# Patient Record
Sex: Female | Born: 1970 | Race: White | Hispanic: No | Marital: Married | Smoking: Never smoker
Health system: Southern US, Community
[De-identification: ages and names within clinical notes are randomized; demographics above are authoritative.]

## PROBLEM LIST (undated history)

## (undated) DIAGNOSIS — IMO0002 Reserved for concepts with insufficient information to code with codable children: Secondary | ICD-10-CM

## (undated) DIAGNOSIS — B977 Papillomavirus as the cause of diseases classified elsewhere: Secondary | ICD-10-CM

## (undated) DIAGNOSIS — F419 Anxiety disorder, unspecified: Secondary | ICD-10-CM

## (undated) DIAGNOSIS — B009 Herpesviral infection, unspecified: Secondary | ICD-10-CM

## (undated) DIAGNOSIS — I10 Essential (primary) hypertension: Secondary | ICD-10-CM

## (undated) HISTORY — DX: Herpesviral infection, unspecified: B00.9

## (undated) HISTORY — PX: TENDON RECONSTRUCTION: SHX2487

## (undated) HISTORY — PX: OTHER SURGICAL HISTORY: SHX169

## (undated) HISTORY — DX: Papillomavirus as the cause of diseases classified elsewhere: B97.7

## (undated) HISTORY — PX: WISDOM TOOTH EXTRACTION: SHX21

## (undated) HISTORY — DX: Reserved for concepts with insufficient information to code with codable children: IMO0002

## (undated) HISTORY — DX: Anxiety disorder, unspecified: F41.9

---

## 1990-01-20 HISTORY — PX: GYNECOLOGIC CRYOSURGERY: SHX857

## 1997-06-16 ENCOUNTER — Emergency Department (HOSPITAL_COMMUNITY): Admission: EM | Admit: 1997-06-16 | Discharge: 1997-06-16 | Payer: Self-pay | Admitting: Emergency Medicine

## 1997-07-26 ENCOUNTER — Other Ambulatory Visit: Admission: RE | Admit: 1997-07-26 | Discharge: 1997-07-26 | Payer: Self-pay | Admitting: Gynecology

## 1998-07-23 ENCOUNTER — Other Ambulatory Visit: Admission: RE | Admit: 1998-07-23 | Discharge: 1998-07-23 | Payer: Self-pay | Admitting: Gynecology

## 1999-09-06 ENCOUNTER — Other Ambulatory Visit: Admission: RE | Admit: 1999-09-06 | Discharge: 1999-09-06 | Payer: Self-pay | Admitting: Gynecology

## 2000-09-11 ENCOUNTER — Other Ambulatory Visit: Admission: RE | Admit: 2000-09-11 | Discharge: 2000-09-11 | Payer: Self-pay | Admitting: Gynecology

## 2001-07-21 ENCOUNTER — Encounter: Payer: Self-pay | Admitting: Gynecology

## 2001-07-21 ENCOUNTER — Encounter: Admission: RE | Admit: 2001-07-21 | Discharge: 2001-07-21 | Payer: Self-pay | Admitting: Gynecology

## 2001-09-17 ENCOUNTER — Other Ambulatory Visit: Admission: RE | Admit: 2001-09-17 | Discharge: 2001-09-17 | Payer: Self-pay | Admitting: Gynecology

## 2002-10-14 ENCOUNTER — Other Ambulatory Visit: Admission: RE | Admit: 2002-10-14 | Discharge: 2002-10-14 | Payer: Self-pay | Admitting: Gynecology

## 2003-10-20 ENCOUNTER — Other Ambulatory Visit: Admission: RE | Admit: 2003-10-20 | Discharge: 2003-10-20 | Payer: Self-pay | Admitting: Gynecology

## 2004-10-25 ENCOUNTER — Other Ambulatory Visit: Admission: RE | Admit: 2004-10-25 | Discharge: 2004-10-25 | Payer: Self-pay | Admitting: Gynecology

## 2005-10-31 ENCOUNTER — Other Ambulatory Visit: Admission: RE | Admit: 2005-10-31 | Discharge: 2005-10-31 | Payer: Self-pay | Admitting: Gynecology

## 2006-11-21 DIAGNOSIS — B977 Papillomavirus as the cause of diseases classified elsewhere: Secondary | ICD-10-CM

## 2006-11-21 HISTORY — DX: Papillomavirus as the cause of diseases classified elsewhere: B97.7

## 2006-12-11 ENCOUNTER — Other Ambulatory Visit: Admission: RE | Admit: 2006-12-11 | Discharge: 2006-12-11 | Payer: Self-pay | Admitting: Gynecology

## 2007-06-21 DIAGNOSIS — IMO0002 Reserved for concepts with insufficient information to code with codable children: Secondary | ICD-10-CM

## 2007-06-21 HISTORY — DX: Reserved for concepts with insufficient information to code with codable children: IMO0002

## 2007-07-01 ENCOUNTER — Other Ambulatory Visit: Admission: RE | Admit: 2007-07-01 | Discharge: 2007-07-01 | Payer: Self-pay | Admitting: Gynecology

## 2007-12-13 ENCOUNTER — Other Ambulatory Visit: Admission: RE | Admit: 2007-12-13 | Discharge: 2007-12-13 | Payer: Self-pay | Admitting: Gynecology

## 2007-12-13 ENCOUNTER — Ambulatory Visit: Payer: Self-pay | Admitting: Women's Health

## 2007-12-13 ENCOUNTER — Encounter: Payer: Self-pay | Admitting: Women's Health

## 2008-02-10 ENCOUNTER — Ambulatory Visit: Payer: Self-pay | Admitting: Gynecology

## 2008-02-10 ENCOUNTER — Other Ambulatory Visit: Admission: RE | Admit: 2008-02-10 | Discharge: 2008-02-10 | Payer: Self-pay | Admitting: Gynecology

## 2008-02-10 ENCOUNTER — Encounter: Payer: Self-pay | Admitting: Gynecology

## 2008-02-17 ENCOUNTER — Ambulatory Visit: Payer: Self-pay | Admitting: Gynecology

## 2008-02-24 ENCOUNTER — Encounter: Admission: RE | Admit: 2008-02-24 | Discharge: 2008-02-24 | Payer: Self-pay | Admitting: Gynecology

## 2008-03-23 ENCOUNTER — Ambulatory Visit: Payer: Self-pay | Admitting: Gynecology

## 2008-04-24 ENCOUNTER — Ambulatory Visit: Payer: Self-pay | Admitting: Gynecology

## 2008-06-15 ENCOUNTER — Ambulatory Visit: Payer: Self-pay | Admitting: Gynecology

## 2008-12-21 ENCOUNTER — Other Ambulatory Visit: Admission: RE | Admit: 2008-12-21 | Discharge: 2008-12-21 | Payer: Self-pay | Admitting: Gynecology

## 2008-12-21 ENCOUNTER — Ambulatory Visit: Payer: Self-pay | Admitting: Women's Health

## 2009-12-27 ENCOUNTER — Other Ambulatory Visit
Admission: RE | Admit: 2009-12-27 | Discharge: 2009-12-27 | Payer: Self-pay | Source: Home / Self Care | Admitting: Gynecology

## 2009-12-27 ENCOUNTER — Ambulatory Visit: Payer: Self-pay | Admitting: Women's Health

## 2010-02-14 ENCOUNTER — Other Ambulatory Visit: Payer: Self-pay | Admitting: Gynecology

## 2010-02-14 DIAGNOSIS — Z1239 Encounter for other screening for malignant neoplasm of breast: Secondary | ICD-10-CM

## 2010-02-14 DIAGNOSIS — Z1231 Encounter for screening mammogram for malignant neoplasm of breast: Secondary | ICD-10-CM

## 2010-02-28 ENCOUNTER — Ambulatory Visit
Admission: RE | Admit: 2010-02-28 | Discharge: 2010-02-28 | Disposition: A | Discharge status: Home / Self Care | Payer: BC Managed Care – PPO | Source: Ambulatory Visit | Attending: Gynecology | Admitting: Gynecology

## 2010-02-28 DIAGNOSIS — Z1231 Encounter for screening mammogram for malignant neoplasm of breast: Secondary | ICD-10-CM

## 2010-03-06 ENCOUNTER — Other Ambulatory Visit: Payer: Self-pay | Admitting: Gynecology

## 2010-03-06 DIAGNOSIS — R928 Other abnormal and inconclusive findings on diagnostic imaging of breast: Secondary | ICD-10-CM

## 2010-03-14 ENCOUNTER — Ambulatory Visit
Admission: RE | Admit: 2010-03-14 | Discharge: 2010-03-14 | Disposition: A | Discharge status: Home / Self Care | Payer: BC Managed Care – PPO | Source: Ambulatory Visit | Attending: Gynecology | Admitting: Gynecology

## 2010-03-14 DIAGNOSIS — R928 Other abnormal and inconclusive findings on diagnostic imaging of breast: Secondary | ICD-10-CM

## 2010-12-27 DIAGNOSIS — B009 Herpesviral infection, unspecified: Secondary | ICD-10-CM | POA: Insufficient documentation

## 2010-12-27 DIAGNOSIS — R87619 Unspecified abnormal cytological findings in specimens from cervix uteri: Secondary | ICD-10-CM | POA: Insufficient documentation

## 2010-12-27 DIAGNOSIS — N200 Calculus of kidney: Secondary | ICD-10-CM | POA: Insufficient documentation

## 2010-12-27 DIAGNOSIS — IMO0002 Reserved for concepts with insufficient information to code with codable children: Secondary | ICD-10-CM | POA: Insufficient documentation

## 2011-01-03 ENCOUNTER — Encounter: Payer: Self-pay | Admitting: Women's Health

## 2011-01-03 ENCOUNTER — Other Ambulatory Visit (HOSPITAL_COMMUNITY)
Admission: RE | Admit: 2011-01-03 | Discharge: 2011-01-03 | Disposition: A | Discharge status: Home / Self Care | Payer: BC Managed Care – PPO | Source: Ambulatory Visit | Attending: Women's Health | Admitting: Women's Health

## 2011-01-03 ENCOUNTER — Ambulatory Visit (INDEPENDENT_AMBULATORY_CARE_PROVIDER_SITE_OTHER): Payer: BC Managed Care – PPO | Admitting: Women's Health

## 2011-01-03 VITALS — BP 120/80 | Ht 62.5 in | Wt 127.0 lb

## 2011-01-03 DIAGNOSIS — Z113 Encounter for screening for infections with a predominantly sexual mode of transmission: Secondary | ICD-10-CM

## 2011-01-03 DIAGNOSIS — Z309 Encounter for contraceptive management, unspecified: Secondary | ICD-10-CM

## 2011-01-03 DIAGNOSIS — Z01419 Encounter for gynecological examination (general) (routine) without abnormal findings: Secondary | ICD-10-CM

## 2011-01-03 DIAGNOSIS — R823 Hemoglobinuria: Secondary | ICD-10-CM

## 2011-01-03 DIAGNOSIS — IMO0001 Reserved for inherently not codable concepts without codable children: Secondary | ICD-10-CM

## 2011-01-03 LAB — HEPATITIS C ANTIBODY: HCV Ab: NEGATIVE

## 2011-01-03 LAB — HIV ANTIBODY (ROUTINE TESTING W REFLEX): HIV: NONREACTIVE

## 2011-01-03 LAB — RPR

## 2011-01-03 MED ORDER — ALPRAZOLAM 0.25 MG PO TABS: 0.2500 mg | ORAL_TABLET | Freq: Three times a day (TID) | ORAL | Status: AC | PRN

## 2011-01-03 MED ORDER — VALACYCLOVIR HCL 500 MG PO TABS: 500.0000 mg | ORAL_TABLET | Freq: Every day | ORAL | Status: AC

## 2011-01-03 MED ORDER — NORETHIN ACE-ETH ESTRAD-FE 1-20 MG-MCG PO TABS: 1.0000 | ORAL_TABLET | Freq: Every day | ORAL | Status: DC

## 2011-01-03 MED ORDER — ESCITALOPRAM OXALATE 20 MG PO TABS: 20.0000 mg | ORAL_TABLET | Freq: Every day | ORAL | Status: DC

## 2011-01-03 NOTE — Progress Notes (Signed)
Leslie Ritter [birth year] [id number]    History:    The patient presents for annual exam.  States has had increased acne on Ocella, would like to try a different birth control pill. History of HSV 1, uses  Valtrex. New partner.   Past medical history, past surgical history, family history and social history were all reviewed and documented in the EPIC chart. 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 Long-term history of anxiety and depression.  ROS:  A  ROS was performed and pertinent positives and negatives are included in the history.  Exam:  Filed Vitals:   01/03/11 1112  BP: 120/80    General appearance:  Normal Head/Neck:  Normal, without cervical or supraclavicular adenopathy. Thyroid:  Symmetrical, normal in size, without palpable masses or nodularity. Respiratory  Effort:  Normal  Auscultation:  Clear without wheezing or rhonchi Cardiovascular  Auscultation:  Regular rate, without rubs, murmurs or gallops  Edema/varicosities:  Not grossly evident Abdominal  Soft,nontender, without masses, guarding or rebound.  Liver/spleen:  No organomegaly noted  Hernia:  None appreciated  Skin  Inspection:  Grossly normal  Palpation:  Grossly normal Neurologic/psychiatric  Orientation:  Normal with appropriate conversation.  Mood/affect:  Normal  Genitourinary    Breasts: Examined lying and sitting.     Right: Without masses, retractions, discharge or axillary adenopathy.     Left: Without masses, retractions, discharge or axillary adenopathy.   Inguinal/mons:  Normal without inguinal adenopathy  External genitalia:  Normal  BUS/Urethra/Skene's glands:  Normal  Bladder:  Normal  Vagina:  Normal  Cervix:  Normal  Uterus:   normal in size, shape and contour.  Midline and mobile  Adnexa/parametria:     Rt: Without masses or tenderness.   Lt: Without masses or tenderness.  Anus and perineum: Normal  Digital rectal exam: Normal sphincter  tone without palpated masses or tenderness  Assessment/Plan:  40 y.o. DW F G1 P1 for annual exam.  Monthly 4 day cycle/Ocella.  Rare HSV History of LGSIL in 09 Acne on Ocella Anxiety/depression-stable on Lexapro 20  Plan: Loestrin 1/20, prescription, proper use, slight risk for blood clots and strokes reviewed. Call if no relief with skin Encouraged condoms until permanent partner. SBEs, annual mammogram, Lexapro 20, prescription, proper use, given and reviewed. Has had counseling in the past declines need at this time. Valtrex 500 prescription,  twice a day for 3-5 days when necessary. CBC, UA, Pap, GC/Chlamydia, HIV, hep C and RPR.   [facility], 11:51 AM 01/03/2011

## 2011-01-04 LAB — GC/CHLAMYDIA PROBE AMP, GENITAL: Chlamydia, DNA Probe: NEGATIVE

## 2011-01-06 ENCOUNTER — Telehealth: Payer: Self-pay | Admitting: *Deleted

## 2011-01-06 MED ORDER — DESOGESTREL-ETHINYL ESTRADIOL 0.15-0.02/0.01 MG (21/5) PO TABS: 1.0000 | ORAL_TABLET | Freq: Every day | ORAL | Status: DC

## 2011-01-06 NOTE — Telephone Encounter (Signed)
rx sent to pharmacy

## 2011-01-06 NOTE — Telephone Encounter (Signed)
Pt was last seen on 01/03/11 for annual and switched her ocella due to ache birth control to loestrin 1/20. Pt states she has been on loestrin 1/20 before back in 2011. Pt wants to know if there is anything else she could try? Please advise

## 2011-01-06 NOTE — Telephone Encounter (Signed)
Could try Mircette. Please call patient and call in for her. Mircette daily dispense one pack with 12 refills call if problems.

## 2011-01-06 NOTE — Telephone Encounter (Signed)
Pt called stating her birth Leslie Ritter never got switched, called the pharmacy to check and junel 1/20 was at the pharmacy for pt to use as noted in the office note 01/03/11. Lm on pt vm that this was done.

## 2011-01-10 ENCOUNTER — Other Ambulatory Visit: Payer: Self-pay | Admitting: Women's Health

## 2011-01-17 ENCOUNTER — Other Ambulatory Visit (HOSPITAL_COMMUNITY)
Admission: RE | Admit: 2011-01-17 | Discharge: 2011-01-17 | Disposition: A | Discharge status: Home / Self Care | Payer: BC Managed Care – PPO | Source: Ambulatory Visit | Attending: Obstetrics and Gynecology | Admitting: Obstetrics and Gynecology

## 2011-01-17 ENCOUNTER — Encounter: Payer: Self-pay | Admitting: Women's Health

## 2011-01-17 ENCOUNTER — Ambulatory Visit (INDEPENDENT_AMBULATORY_CARE_PROVIDER_SITE_OTHER): Payer: BC Managed Care – PPO | Admitting: Women's Health

## 2011-01-17 DIAGNOSIS — Z01419 Encounter for gynecological examination (general) (routine) without abnormal findings: Secondary | ICD-10-CM | POA: Insufficient documentation

## 2011-01-17 DIAGNOSIS — R87616 Satisfactory cervical smear but lacking transformation zone: Secondary | ICD-10-CM

## 2011-01-17 NOTE — Progress Notes (Signed)
Patient ID: Leslie Ritter, female   DOB: 30-Nov-1970, 40 y.o.   MRN: [id number] Pap at annual exam lacked endocervical cells. History of cryo- in 92, history of LGSIL/CIN 1 on biopsy- 2009.  Paps after normal.  External genitalia is within normal limits, speculum exam cervix is pink healthy without lesion or discharge. Repeat Pap taken will triage based on results.

## 2011-01-28 ENCOUNTER — Encounter: Payer: Self-pay | Admitting: *Deleted

## 2011-01-28 NOTE — Progress Notes (Signed)
Patient ID: Leslie Ritter, female   DOB: 1970-01-24, 41 y.o.   MRN: [id number] PT CALLED WANTING RECENT PAP RESULTS, RESULTS GIVEN TO PT.

## 2011-02-07 ENCOUNTER — Other Ambulatory Visit: Payer: Self-pay | Admitting: Gynecology

## 2011-02-07 DIAGNOSIS — Z1231 Encounter for screening mammogram for malignant neoplasm of breast: Secondary | ICD-10-CM

## 2011-03-10 ENCOUNTER — Telehealth: Payer: Self-pay | Admitting: *Deleted

## 2011-03-10 MED ORDER — NORETHINDRONE ACET-ETHINYL EST 1.5-30 MG-MCG PO TABS: 1.0000 | ORAL_TABLET | Freq: Every day | ORAL | Status: DC

## 2011-03-10 NOTE — Telephone Encounter (Signed)
We can try loestrin 1/20  Tell her estrogen same, progestin slightly different, call if problem with the change. Lexapro, I think she is on Celexa 20  So we could call in Celexa 40  #45 3 month supply  And have her cut in half.  Is this what she wants?

## 2011-03-10 NOTE — Telephone Encounter (Signed)
Currently on Lexapro 20  Can call in Lexapro 40  #45 4 refills.  Take one tablet of 20 daily. (Sorry for confusion)

## 2011-03-10 NOTE — Telephone Encounter (Signed)
Pt informed with the below note, rx sent to pharmacy for loestrin 1/20

## 2011-03-10 NOTE — Telephone Encounter (Signed)
Pt was given rx for Mircette pills on 01/06/11. She has new insurance and now has to pay for her birth control pills. Pt faxed a list of birth contiol pills that will be no cost, pt will need a new rx for contraceptive that is compatible to the mircette. Pt also wanted to know if you could help her with her lexapro rx as well. Please see both sheet for contraceptive & half tablet program for her lexapro. Please advise

## 2011-03-11 MED ORDER — ESCITALOPRAM OXALATE 20 MG PO TABS: ORAL_TABLET | ORAL | Status: DC

## 2011-03-11 NOTE — Telephone Encounter (Signed)
Pt informed rx sent to pharmacy.

## 2011-03-20 ENCOUNTER — Ambulatory Visit
Admission: RE | Admit: 2011-03-20 | Discharge: 2011-03-20 | Disposition: A | Discharge status: Home / Self Care | Payer: 59 | Source: Ambulatory Visit | Attending: Gynecology | Admitting: Gynecology

## 2011-03-20 DIAGNOSIS — Z1231 Encounter for screening mammogram for malignant neoplasm of breast: Secondary | ICD-10-CM

## 2011-07-02 ENCOUNTER — Telehealth: Payer: Self-pay | Admitting: *Deleted

## 2011-07-02 MED ORDER — ESCITALOPRAM OXALATE 20 MG PO TABS: 20.0000 mg | ORAL_TABLET | Freq: Every day | ORAL | Status: DC

## 2011-07-02 NOTE — Telephone Encounter (Signed)
Ok to write for 6 month supply  How is it to be written Lexapro 10 daily #280?

## 2011-07-02 NOTE — Telephone Encounter (Signed)
Leslie Ritter she is on lexapro 20 daily

## 2011-07-02 NOTE — Telephone Encounter (Signed)
rx sent to pharmacy

## 2011-07-02 NOTE — Telephone Encounter (Signed)
Pt asked if she could have a 6 month supply of her generic lexapro she can have this for $37 for 6 months. She pays $35 a month now. Please advise

## 2011-07-04 ENCOUNTER — Ambulatory Visit (INDEPENDENT_AMBULATORY_CARE_PROVIDER_SITE_OTHER): Payer: 59

## 2011-07-04 ENCOUNTER — Ambulatory Visit (INDEPENDENT_AMBULATORY_CARE_PROVIDER_SITE_OTHER): Payer: 59 | Admitting: Gynecology

## 2011-07-04 ENCOUNTER — Other Ambulatory Visit: Payer: Self-pay | Admitting: Gynecology

## 2011-07-04 ENCOUNTER — Encounter: Payer: Self-pay | Admitting: Gynecology

## 2011-07-04 DIAGNOSIS — R109 Unspecified abdominal pain: Secondary | ICD-10-CM

## 2011-07-04 DIAGNOSIS — K589 Irritable bowel syndrome without diarrhea: Secondary | ICD-10-CM

## 2011-07-04 DIAGNOSIS — R1030 Lower abdominal pain, unspecified: Secondary | ICD-10-CM

## 2011-07-04 DIAGNOSIS — N898 Other specified noninflammatory disorders of vagina: Secondary | ICD-10-CM

## 2011-07-04 LAB — URINALYSIS W MICROSCOPIC + REFLEX CULTURE
Bilirubin Urine: NEGATIVE
Glucose, UA: NEGATIVE mg/dL
Ketones, ur: 15 mg/dL — AB
Protein, ur: NEGATIVE mg/dL
Urobilinogen, UA: 0.2 mg/dL (ref 0.0–1.0)

## 2011-07-04 LAB — WET PREP FOR TRICH, YEAST, CLUE: Trich, Wet Prep: NONE SEEN

## 2011-07-04 MED ORDER — DICYCLOMINE HCL 10 MG PO CAPS: 10.0000 mg | ORAL_CAPSULE | Freq: Three times a day (TID) | ORAL | Status: DC

## 2011-07-04 MED ORDER — CLINDAMYCIN PHOSPHATE 2 % VA CREA: 1.0000 | TOPICAL_CREAM | Freq: Every day | VAGINAL | Status: AC

## 2011-07-04 NOTE — Progress Notes (Signed)
Patient presented to the office today stating that 3 days ago she began experiencing diffuse low abdominal discomfort the right greater than her left. She has slight discharge. She is in a monogamous relationship. She denied any dysuria or frequency or back pain. Her menstrual cycles are regular she's currently on oral contraceptive pill. She denies any fever chills nausea or vomiting. Last menstrual period was normal June 5. Patient with past history of right nephrolithiasis in 2004.  Exam: Back: No CVA tenderness Abdomen: Soft nontender no rebound only slight vague tenderness in lower abdomen bilateral but no rebound or guarding. Pelvic: Bartholin urethra Skene was within normal limits Vagina: Slight discharge otherwise normal Cervix no lesion discharge Uterus slightly anteverted normal size shape and consistency Adnexa no palpable masses or tenderness Rectal: Not done  Ultrasound: Uterus measured 8.2 x 4.8 x 3.7 cm endometrial stripe 3.2 mm. Right and left ovary were otherwise normal. No free fluid was seen. Right kidney was normal.  Urinalysis 0-2 WBC, 0-6 RBC, many epithelial cells, many bacteria. Urine culture pending   wet prep: Moderate white blood cells too numerous to count bacteria  Assessment/plan: Possible diagnosis #1 IBS for this reason patient will be given a prescription of Bentyl 10 mg to take 1 by mouth 3 times a day to 4 times a day for the next one to 2 weeks when necessary. #2 early urinary tract infection /cystitis, urinalysis today not impressive we will wait for urine culture. Literature information on IBS was provided. If her urine culture is negative and her symptoms continue despite on the Bentyl she will be referred to the gastroenterologist. I've also recommended that she apply one tablespoon of uterine Metamucil or MiraLax to her juice daily. She was given a prescription Cleocin vaginal cream to apply each bedtime for 5 nights for possible BV.

## 2011-07-04 NOTE — Patient Instructions (Addendum)
Irritable Bowel Syndrome Irritable Bowel Syndrome (IBS) is caused by a disturbance of normal bowel function. Other terms used are spastic colon, mucous colitis, and irritable colon. It does not require surgery, nor does it lead to cancer. There is no cure for IBS. But with proper diet, stress reduction, and medication, you will find that your problems (symptoms) will gradually disappear or improve. IBS is a common digestive disorder. It usually appears in late adolescence or early adulthood. Women develop it twice as often as men. CAUSES   After food has been digested and absorbed in the small intestine, waste material is moved into the colon (large intestine). In the colon, water and salts are absorbed from the undigested products coming from the small intestine. The remaining residue, or fecal material, is held for elimination. Under normal circumstances, gentle, rhythmic contractions on the bowel walls push the fecal material along the colon towards the rectum. In IBS, however, these contractions are irregular and poorly coordinated. The fecal material is either retained too long, resulting in constipation, or expelled too soon, producing diarrhea. SYMPTOMS   The most common symptom of IBS is pain. It is typically in the lower left side of the belly (abdomen). But it may occur anywhere in the abdomen. It can be felt as heartburn, backache, or even as a dull pain in the arms or shoulders. The pain comes from excessive bowel-muscle spasms and from the buildup of gas and fecal material in the colon. This pain:  Can range from sharp belly (abdominal) cramps to a dull, continuous ache.   Usually worsens soon after eating.   Is typically relieved by having a bowel movement or passing gas.  Abdominal pain is usually accompanied by constipation. But it may also produce diarrhea. The diarrhea typically occurs right after a meal or upon arising in the morning. The stools are typically soft and watery. They are  often flecked with secretions (mucus). Other symptoms of IBS include:  Bloating.   Loss of appetite.   Heartburn.   Feeling sick to your stomach (nausea).   Belching   Vomiting   Gas.  IBS may also cause a number of symptoms that are unrelated to the digestive system:  Fatigue.   Headaches.   Anxiety   Shortness of breath   Difficulty in concentrating.   Dizziness.  These symptoms tend to come and go. DIAGNOSIS   The symptoms of IBS closely mimic the symptoms of other, more serious digestive disorders. So your caregiver may wish to perform a variety of additional tests to exclude these disorders. He/she wants to be certain of learning what is wrong (diagnosis). The nature and purpose of each test will be explained to you. TREATMENT A number of medications are available to help correct bowel function and/or relieve bowel spasms and abdominal pain. Among the drugs available are:  Mild, non-irritating laxatives for severe constipation and to help restore normal bowel habits.   Specific anti-diarrheal medications to treat severe or prolonged diarrhea.   Anti-spasmodic agents to relieve intestinal cramps.   Your caregiver may also decide to treat you with a mild tranquilizer or sedative during unusually stressful periods in your life.  The important thing to remember is that if any drug is prescribed for you, make sure that you take it exactly as directed. Make sure that your caregiver knows how well it worked for you. HOME CARE INSTRUCTIONS    Avoid foods that are high in fat or oils. Some examples are:heavy cream, butter, frankfurters,   sausage, and other fatty meats.   Avoid foods that have a laxative effect, such as fruit, fruit juice, and dairy products.   Cut out carbonated drinks, chewing gum, and "gassy" foods, such as beans and cabbage. This may help relieve bloating and belching.   Bran taken with plenty of liquids may help relieve constipation.   Keep track  of what foods seem to trigger your symptoms.   Avoid emotionally charged situations or circumstances that produce anxiety.   Start or continue exercising.   Get plenty of rest and sleep.  MAKE SURE YOU:    Understand these instructions.   Will watch your condition.   Will get help right away if you are not doing well or get worse.  Document Released: 01/06/2005 Document Revised: 12/26/2010 Document Reviewed: 08/27/2007 ExitCare Patient Information 2012 [employer]. 

## 2011-07-06 LAB — URINE CULTURE: Organism ID, Bacteria: NO GROWTH

## 2012-01-09 ENCOUNTER — Ambulatory Visit (INDEPENDENT_AMBULATORY_CARE_PROVIDER_SITE_OTHER): Payer: 59 | Admitting: Women's Health

## 2012-01-09 ENCOUNTER — Encounter: Payer: Self-pay | Admitting: Women's Health

## 2012-01-09 ENCOUNTER — Other Ambulatory Visit (HOSPITAL_COMMUNITY)
Admission: RE | Admit: 2012-01-09 | Discharge: 2012-01-09 | Disposition: A | Discharge status: Home / Self Care | Payer: 59 | Source: Ambulatory Visit | Attending: Women's Health | Admitting: Women's Health

## 2012-01-09 VITALS — BP 136/98 | Ht 62.0 in | Wt 130.0 lb

## 2012-01-09 DIAGNOSIS — Z309 Encounter for contraceptive management, unspecified: Secondary | ICD-10-CM

## 2012-01-09 DIAGNOSIS — Z01419 Encounter for gynecological examination (general) (routine) without abnormal findings: Secondary | ICD-10-CM

## 2012-01-09 DIAGNOSIS — Z13220 Encounter for screening for lipoid disorders: Secondary | ICD-10-CM

## 2012-01-09 DIAGNOSIS — F419 Anxiety disorder, unspecified: Secondary | ICD-10-CM

## 2012-01-09 DIAGNOSIS — Z833 Family history of diabetes mellitus: Secondary | ICD-10-CM

## 2012-01-09 DIAGNOSIS — Z1151 Encounter for screening for human papillomavirus (HPV): Secondary | ICD-10-CM | POA: Insufficient documentation

## 2012-01-09 DIAGNOSIS — F411 Generalized anxiety disorder: Secondary | ICD-10-CM

## 2012-01-09 DIAGNOSIS — IMO0001 Reserved for inherently not codable concepts without codable children: Secondary | ICD-10-CM

## 2012-01-09 DIAGNOSIS — Z113 Encounter for screening for infections with a predominantly sexual mode of transmission: Secondary | ICD-10-CM

## 2012-01-09 LAB — LIPID PANEL
HDL: 88 mg/dL (ref >39)
LDL Cholesterol: 45 mg/dL (ref 0–99)
Total CHOL/HDL Ratio: 1.6 Ratio
VLDL: 11 mg/dL (ref 0–40)

## 2012-01-09 LAB — CBC WITH DIFFERENTIAL/PLATELET
Eosinophils Relative: 1 % (ref 0–5)
HCT: 40.8 % (ref 36.0–46.0)
Hemoglobin: 14.5 g/dL (ref 12.0–15.0)
Lymphocytes Relative: 19 % (ref 12–46)
MCHC: 35.5 g/dL (ref 30.0–36.0)
MCV: 88.5 fL (ref 78.0–100.0)
Monocytes Absolute: 0.8 10*3/uL (ref 0.1–1.0)
Monocytes Relative: 7 % (ref 3–12)
Neutro Abs: 8.1 10*3/uL — ABNORMAL HIGH (ref 1.7–7.7)

## 2012-01-09 LAB — HEPATITIS B SURFACE ANTIGEN: Hepatitis B Surface Ag: NEGATIVE

## 2012-01-09 LAB — RPR

## 2012-01-09 MED ORDER — SERTRALINE HCL 50 MG PO TABS: 50.0000 mg | ORAL_TABLET | Freq: Every day | ORAL | Status: DC

## 2012-01-09 MED ORDER — NORETHINDRONE 0.35 MG PO TABS: 1.0000 | ORAL_TABLET | Freq: Every day | ORAL | Status: DC

## 2012-01-09 NOTE — Progress Notes (Signed)
Leslie Ritter 18-Sep-1970 [id number]    History:    The patient presents for annual exam.  Monthly cycle on Loestrin 1/20. New partner. History of abnormal Paps. Cryo- 1992. 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 2010 right breast cyst - fine needle aspiration debris with associated inflammatory cells. Depression/anxiety currently on Lexapro 20 without good relief. Has had counseling in the past and denies need for at this time. History of HSV 1. Kidney stone 2004  Past medical history, past surgical history, family history and social history were all reviewed and documented in the EPIC chart. 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 [place] 41 at [employer] [facility] doing well. Sr. and mother with hypertension.   ROS:  A  ROS was performed and pertinent positives and negatives are included in the history.  Exam:  Filed Vitals:   01/09/12 1153  BP: 136/98    General appearance:  Normal Head/Neck:  Normal, without cervical or supraclavicular adenopathy. Thyroid:  Symmetrical, normal in size, without palpable masses or nodularity. Respiratory  Effort:  Normal  Auscultation:  Clear without wheezing or rhonchi Cardiovascular  Auscultation:  Regular rate, without rubs, murmurs or gallops  Edema/varicosities:  Not grossly evident Abdominal  Soft,nontender, without masses, guarding or rebound.  Liver/spleen:  No organomegaly noted  Hernia:  None appreciated  Skin  Inspection:  Grossly normal  Palpation:  Grossly normal Neurologic/psychiatric  Orientation:  Normal with appropriate conversation.  Mood/affect:  Normal  Genitourinary    Breasts: Examined lying and sitting.     Right: Without masses, retractions, discharge or axillary adenopathy.     Left: Without masses, retractions, discharge or axillary adenopathy.   Inguinal/mons:  Normal without inguinal adenopathy  External genitalia:  Normal  BUS/Urethra/Skene's glands:   Normal  Bladder:  Normal  Vagina:  Normal  Cervix:  Normal  Uterus:   normal in size, shape and contour.  Midline and mobile  Adnexa/parametria:     Rt: Without masses or tenderness.   Lt: Without masses or tenderness.  Anus and perineum: Normal  Digital rectal exam: Normal sphincter tone without palpated masses or tenderness  Assessment/Plan:  41 y.o. D. WF G1 P1  for annual exam.     Anxiety/depression-Lexapro without relief LGSIL/CIN-1 on biopsy 2009 with normal Paps after Blood pressure 138/98  Plan: Reviewed importance of changing from combination pills to Micronor to see if blood pressure decreases. States issues may not greater than 130/80. Will monitor and followup with primary care if B/P continues greater than 130/80. Prescription, proper use, of Micronor given and reviewed. Will start after finishing current pack. Reviewed no placebos. Will try Zoloft 50, will taper down on Lexapro for several weeks, Zoloft half tablet while weaning down on Lexapro.  Counseling as needed. Reviewed importance of exercise in relationship to mood and health. SBE's, continue annual mammogram, calcium rich diet, vitamin D 1000 daily encouraged. Condoms encouraged until permanent partner. CBC, glucose, lipid panel, UA, Pap, GC/Chlamydia, HIV, hepatitis B, C., RPR.   Kamree Wiens J WHNP, 1:05 PM 01/09/2012

## 2012-01-09 NOTE — Patient Instructions (Signed)

## 2012-01-09 NOTE — Addendum Note (Signed)
Addended by: Leonard Schwartz A on: 01/09/2012 02:00 PM   Modules accepted: Orders

## 2012-01-10 LAB — URINALYSIS W MICROSCOPIC + REFLEX CULTURE
Casts: NONE SEEN
Crystals: NONE SEEN
Glucose, UA: NEGATIVE mg/dL
Leukocytes, UA: NEGATIVE
pH: 5.5 (ref 5.0–8.0)

## 2012-01-11 LAB — URINE CULTURE: Colony Count: 9000

## 2012-01-13 ENCOUNTER — Encounter: Payer: Self-pay | Admitting: *Deleted

## 2012-01-13 NOTE — Progress Notes (Signed)
Patient ID: Leslie Ritter, female   DOB: 05-19-1970, 41 y.o.   MRN: [id number] Pt said current birth control pill ortho micronor too expensive. Pt said some pills are free under her insurance, she will find which pill are free and will call back to let us to know and I will send message to nancy with list of pills.

## 2012-01-19 ENCOUNTER — Telehealth: Payer: Self-pay | Admitting: *Deleted

## 2012-01-19 MED ORDER — FLUCONAZOLE 100 MG PO TABS: ORAL_TABLET | ORAL | Status: DC

## 2012-01-19 NOTE — Telephone Encounter (Signed)
rx sent, pt informed.  

## 2012-01-19 NOTE — Telephone Encounter (Signed)
Okay, please call in Diflucan 100, #30, no refills. have her take 2 today and one monthly as needed. Had yeast at last office visit.

## 2012-01-19 NOTE — Telephone Encounter (Signed)
Pt has current yeast infection now x 3 days now. Pt said that she use to get a 30 day supply of diflucan to take after her cycle. She asked if this could be given? Please advise

## 2012-01-20 ENCOUNTER — Encounter: Payer: Self-pay | Admitting: Gynecology

## 2012-01-20 ENCOUNTER — Ambulatory Visit (INDEPENDENT_AMBULATORY_CARE_PROVIDER_SITE_OTHER): Payer: 59 | Admitting: Gynecology

## 2012-01-20 VITALS — BP 152/92

## 2012-01-20 DIAGNOSIS — Z302 Encounter for sterilization: Secondary | ICD-10-CM

## 2012-01-20 NOTE — Patient Instructions (Signed)
Office will contact you to arrange surgery. 

## 2012-01-20 NOTE — Progress Notes (Signed)
Patient presents to discuss possible tubal sterilization. Recently diagnosed with hypertension and started on hydrochlorothiazide. I reviewed all options of contraception with her to include pill patch ring Depo-Provera Implanon IUD vasectomy Essure tubal sterilization. Pros/cons, risks/benefits of each choice reviewed. Discussed most recent SGO recommendation for risk reduction to perform salpingectomies for sterilization to help decrease risk of ovarian cancer in the future. Absolute sterility associated with this as well as potential for failure and increased risk for ectopic pregnancy discussed. Risks of laparoscopy to include infection prolonged antibiotics hemorrhage necessitating transfusion and transfusion risks to include transfusion reaction hepatitis HIV mad cow disease and other unknown entities. Incisional complications requiring opening and draining of incisions closure by secondary intention and long-term issues of hernia formation and cosmetic such as keloid formation reviewed. Inadvertent injury to internal organs including bowel bladder ureters vessels and nerves either immediately recognized or delay recognized leading to major exploratory reparative surgeries and future reparative surgeries including ostomy formation by resection bladder repair ureteral damage repair all discussed understood and accepted. The patient wants to schedule and we will do so at her convenience and she'll represent for a preoperative consult.

## 2012-01-26 ENCOUNTER — Telehealth: Payer: Self-pay | Admitting: Gynecology

## 2012-01-26 NOTE — Telephone Encounter (Signed)
I spoke with patient's insurance company.  Her plan covers Lap Tubal Ligation at 100% under the new Health Care Reform.  However, insurance company says the CPT code for Salpinectomy is not associated with Sterilization therefore, patient will pay for surgery just like any other surgery.  [place]. Has the wheels turning with [facility] to see about contacting ins companies regarding the code.  However, she says it will probably be greater than 6 mos before this is resolved and not sure even then that insurance company will recognize this procedure as sterilization procedure and pay for it 100%.  Patient was advised of all this.  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   She asked me to ask you if you would be willing to do the procedure that her insurance company recognizes as sterilization procedure to that it will pay 100% and she can go ahead and get it done now.  She said if you are not comfortable with that she is going to have to wait and hope that they eventually will cover it 100% like they do the Lap Tubal Ligation.

## 2012-01-27 NOTE — Telephone Encounter (Signed)
I spoke with patient and let her know Dr. Velvet Bathe out of office and we will confer with him when he returns.  She had called me back yesterday as she had called [employer] too and [employer] recommended prior authorization where we would send clinicals in advance and let them approve/deny charges prior to procedure.  I told her I am happy to do that but again will need to wait for Dr. Velvet Bathe so that I can get his clinical info to inform [facility] why he is choosing salpingectomy over ligation.  I will continue to work on this and will keep in touch with her.

## 2012-01-27 NOTE — Telephone Encounter (Signed)
Code will be for sterilization and I would charge for whatever code for excise portion of tube works for reimbursement.  Something has to work because plenty of MD's use partial salpingectomy as procedure of choice.

## 2012-02-03 ENCOUNTER — Other Ambulatory Visit: Payer: Self-pay | Admitting: Women's Health

## 2012-02-13 ENCOUNTER — Other Ambulatory Visit: Payer: Self-pay | Admitting: Gynecology

## 2012-02-13 DIAGNOSIS — Z1231 Encounter for screening mammogram for malignant neoplasm of breast: Secondary | ICD-10-CM

## 2012-03-01 ENCOUNTER — Telehealth: Payer: Self-pay

## 2012-03-01 NOTE — Telephone Encounter (Signed)
On 02/19/12 I faxed appeal info to [facility] asking them to cover patients Lap Salpingectomy at 100% like they cover Lap Tubal Lig since it is for sterilization.  I have not heard anything as to date.  Today patient called and said she received a letter saying it is covered and medically indicated.  I called [employer] and Silva Bandy. Looked up the case # and told me they had determined it was "approved and covered".  She could not elaborate as I did remind her that we knew it was"covered" as it had been from the beginning but was applied to $3500 deductible and covered that way instead of 100%.  She could only read me what was written in front of her "approved and covered".   I called patient and relayed all of this in voice mail.  Hopefully her letter she received elaborates a bit more and gives patient the reassurance she needs to go ahead and be able to have her lap salpinectomy for sterilization.  Left message for patient to call me.

## 2012-03-01 NOTE — Telephone Encounter (Signed)
Patient called and said she would fax letter to me and she did.  I noticed the letter was dated 02/16/12 and I did not start appeal process until 02/19/12 so this is the reply to the prior-authorization I did first that I am now appealing.  I did fax a letter to [facility] asking them to confirm that they received my appeal of five pages via fax on 02/19/12.  Patient was informed of all this.  I will let her know as soon as I hear the status of my appeal.

## 2012-03-17 ENCOUNTER — Telehealth: Payer: Self-pay

## 2012-03-17 ENCOUNTER — Other Ambulatory Visit: Payer: Self-pay

## 2012-03-17 ENCOUNTER — Other Ambulatory Visit: Payer: Self-pay | Admitting: Women's Health

## 2012-03-17 MED ORDER — NORETHIN ACE-ETH ESTRAD-FE 1.5-30 MG-MCG PO TABS: 1.0000 | ORAL_TABLET | Freq: Every day | ORAL | Status: DC

## 2012-03-17 NOTE — Telephone Encounter (Signed)
Patient called. Needs refill on OC.  She said that she needs refill on Lo-estrin 1.5/30 and the oc [place] prescribed in Dec was not this but she took this one prior and [place] intended for her to get this one.  i refilled it until CE due.

## 2012-03-17 NOTE — Telephone Encounter (Signed)
I called patient to update her regarding trying to get [facility] to cover her Lap Salpinectomy for sterilization the way that do Lap Tubal Ligation.  I have spent a couple of hours combined over the last week trying to get answers.  Today I spoke with two different reps and ultimately asked to speak with supervisor.  The Rep Alcario Drought told me that her supervisor off site but she did communicate with her and they determined that this is not something they can do.  They cannot change the "benefit plan language".  The employer would have to be the one to do this. "The one who writes the benefits would be the one to change this and that is the employer.  I told her that a rep that had recommended I fax this "benefit exception request" a month ago and what is the purpose of that is not for this situation. She said generally a benefit exception request is made when a specific service is not covered by the plan.  In this case the Lap Salpingectomy is covered by the plan just not the way the patient wants it to be covered and she must deal with her employer on that.  I notified Jaleah of this conversation and I will fax her the appeal letter and article provided by Dr. Audie Box in case it might assist her employer.  I told her to contact me if I can be of any help with this.

## 2012-03-19 ENCOUNTER — Telehealth: Payer: Self-pay

## 2012-03-19 NOTE — Telephone Encounter (Signed)
Patient informed. LTL scheduled for 03/30/12 7:30am and pre-op consult scheduled 03/25/12 7:30am.

## 2012-03-19 NOTE — Telephone Encounter (Signed)
I will be more than happy to do a laparoscopic tubal and not salpingectomy. I just offered that to the patient as an option but she says no I just want to have a lap tubal I am more than happy to do that.

## 2012-03-19 NOTE — Telephone Encounter (Signed)
I have spent a month trying to get [facility] to pay for Lap Salpingectomy for sterilization like they do Lap Tubal Ligation (pays 100%).  Yesterday they told me that it was not something that they could approve and it was up to the patient's employer as the employer determines the "benefit plan language" and benefits with contracting.  Patient was informed.  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  Indeed the Lap Salpinectomy is written in their policy to be covered under deductible ($3500 and co-insurance) and Lap Tubal at 100%.  They gave her other sterilization codes that were covered 100% but none are procedures that were appropriate.  Patient said she has been a patient at [facility] for 20+ years and she does not want to leave the practice to find provider to do lap tubal and asked if anybody else here does it.  I told her that Dr. Glenetta Hew had indicated he was continuing to do Lap Tubal.  Her insurance will pay 100% of all her bills if she has Lap Tubal done and she wanted me to ask if you would be okay with her letting Dr. Glenetta Hew do it just for that reason.  She has seen him in the past.

## 2012-03-22 ENCOUNTER — Encounter (HOSPITAL_COMMUNITY): Payer: Self-pay | Admitting: Pharmacist

## 2012-03-25 ENCOUNTER — Ambulatory Visit (INDEPENDENT_AMBULATORY_CARE_PROVIDER_SITE_OTHER): Payer: 59 | Admitting: Gynecology

## 2012-03-25 ENCOUNTER — Ambulatory Visit
Admission: RE | Admit: 2012-03-25 | Discharge: 2012-03-25 | Disposition: A | Discharge status: Home / Self Care | Payer: 59 | Source: Ambulatory Visit | Attending: Gynecology | Admitting: Gynecology

## 2012-03-25 ENCOUNTER — Encounter: Payer: Self-pay | Admitting: Gynecology

## 2012-03-25 DIAGNOSIS — Z1231 Encounter for screening mammogram for malignant neoplasm of breast: Secondary | ICD-10-CM

## 2012-03-25 DIAGNOSIS — Z302 Encounter for sterilization: Secondary | ICD-10-CM

## 2012-03-25 NOTE — Progress Notes (Signed)
Leslie Ritter [birth year] [id number]   Preoperative consult  Chief complaint: Desires sterilization  History of present illness: 42 y.o. G1P1 who desires permanent sterilization.  Is being followed for hypertension on oral contraceptives.  I reviewed all options of contraception with her to include pill patch ring Depo-Provera Implanon IUD vasectomy Essure tubal sterilization. Pros/cons, risks/benefits of each choice reviewed. I also reviewed the most recent SGO recommendations to consider salpingectomies as risk reduction surgery  for ovarian cancer and the patient declines this and wants traditional laparoscopic tubal sterilization.   Past medical history,surgical history, medications, allergies, family history and social history were all reviewed and documented in the EPIC chart. ROS:  Was performed and pertinent positives and negatives are included in the history of present illness.  Exam:  Kim  assistant General: well developed, well nourished female, no acute distress HEENT: normal  Lungs: clear to auscultation without wheezing, rales or rhonchi  Cardiac: regular rate without rubs, murmurs or gallops  Abdomen: soft, nontender without masses, guarding, rebound, organomegaly  Pelvic: external bus vagina: normal   Cervix: grossly normal  Uterus: normal size, midline and mobile, nontender  Adnexa: without masses or tenderness  Rectovaginal exam within normal limits     Assessment/Plan:  42 y.o. G1P1 for laparoscopic tubal sterilization after counseling for alternatives.  The absolute sterility associated with tubal sterilization as well as the potential for failure and increased risk for ectopic pregnancy discussed. The expected intraoperative and postoperative courses were reviewed as well as the operative recovery period.  Risks of laparoscopy to include infection, prolonged antibiotics, hemorrhage necessitating transfusion and transfusion risks to include transfusion reaction, hepatitis,  HIV, mad cow disease and other unknown entities. Incisional complications requiring opening and draining of incisions closure by secondary intention and long-term issues of hernia formation and cosmetic such as keloid formation reviewed. Inadvertent injury to internal organs including bowel, bladder, ureters, vessels and nerves either immediately recognized or delay recognized leading to major exploratory reparative surgeries and future reparative surgeries including ostomy formation, bowel resection, bladder repair ureteral damage repair was all discussed understood and accepted. The patient's questions were answered to her satisfaction and she is ready to proceed with surgery.    Dara Lords MD, 10:11 AM 03/25/2012

## 2012-03-25 NOTE — H&P (Signed)
  Leslie Ritter 1970/04/05 [id number]   History and Physical   Chief complaint: Desires sterilization  History of present illness: 42 y.o. G1P1 who desires permanent sterilization.  Is being followed for hypertension on oral contraceptives.  I reviewed all options of contraception with her to include pill patch ring Depo-Provera Implanon IUD vasectomy Essure tubal sterilization. Pros/cons, risks/benefits of each choice reviewed. I also reviewed the most recent SGO recommendations to consider salpingectomies as risk reduction surgery  for ovarian cancer and the patient declines this and wants traditional laparoscopic tubal sterilization.   Past medical history,surgical history, medications, allergies, family history and social history were all reviewed and documented in the EPIC chart. ROS:  Was performed and pertinent positives and negatives are included in the history of present illness.  Exam:  Kim  assistant General: well developed, well nourished female, no acute distress HEENT: normal  Lungs: clear to auscultation without wheezing, rales or rhonchi  Cardiac: regular rate without rubs, murmurs or gallops  Abdomen: soft, nontender without masses, guarding, rebound, organomegaly  Pelvic: external bus vagina: normal   Cervix: grossly normal  Uterus: normal size, midline and mobile, nontender  Adnexa: without masses or tenderness  Rectovaginal exam within normal limits     Assessment/Plan:  42 y.o. G1P1 for laparoscopic tubal sterilization after counseling for alternatives.  The absolute sterility associated with tubal sterilization as well as the potential for failure and increased risk for ectopic pregnancy discussed. The expected intraoperative and postoperative courses were reviewed as well as the operative recovery period.  Risks of laparoscopy to include infection, prolonged antibiotics, hemorrhage necessitating transfusion and transfusion risks to include transfusion reaction,  hepatitis, HIV, mad cow disease and other unknown entities. Incisional complications requiring opening and draining of incisions closure by secondary intention and long-term issues of hernia formation and cosmetic such as keloid formation reviewed. Inadvertent injury to internal organs including bowel, bladder, ureters, vessels and nerves either immediately recognized or delay recognized leading to major exploratory reparative surgeries and future reparative surgeries including ostomy formation, bowel resection, bladder repair ureteral damage repair was all discussed understood and accepted. The patient's questions were answered to her satisfaction and she is ready to proceed with surgery.    Dara Lords MD, 10:19 AM 03/25/2012

## 2012-03-25 NOTE — Patient Instructions (Addendum)
Followup for surgery as scheduled. 

## 2012-03-26 ENCOUNTER — Other Ambulatory Visit: Payer: Self-pay

## 2012-03-26 ENCOUNTER — Encounter (HOSPITAL_COMMUNITY)
Admission: RE | Admit: 2012-03-26 | Discharge: 2012-03-26 | Disposition: A | Discharge status: Home / Self Care | Payer: 59 | Source: Ambulatory Visit | Attending: Gynecology | Admitting: Gynecology

## 2012-03-26 ENCOUNTER — Encounter (HOSPITAL_COMMUNITY): Payer: Self-pay

## 2012-03-26 HISTORY — DX: Essential (primary) hypertension: I10

## 2012-03-26 LAB — CBC
MCH: 30.9 pg (ref 26.0–34.0)
MCV: 90.3 fL (ref 78.0–100.0)
Platelets: 343 10*3/uL (ref 150–400)
RDW: 13.4 % (ref 11.5–15.5)
WBC: 10.2 10*3/uL (ref 4.0–10.5)

## 2012-03-26 LAB — BASIC METABOLIC PANEL
BUN: 10 mg/dL (ref 6–23)
Calcium: 9.2 mg/dL (ref 8.4–10.5)
GFR calc non Af Amer: >90 mL/min (ref >90)
Glucose, Bld: 127 mg/dL — ABNORMAL HIGH (ref 70–99)

## 2012-03-26 NOTE — Patient Instructions (Addendum)
   Your procedure is scheduled on: Tuesday, March 11th  Enter through the Main Entrance of [facility] at:  6 am Pick up the phone at the desk and dial [phone] and inform us of your arrival.  Please call this number if you have any problems the morning of surgery: [phone]  Remember: Do not eat or drink after midnight:  Monday Take these medicines the morning of surgery with a SIP OF WATER: bp pill, zofolt, birth control meds  Do not wear jewelry, make-up, or FINGER nail polish No metal in your hair or on your body. Do not wear lotions, powders, perfumes. You may wear deodorant.  Please use your CHG wash as directed prior to surgery.  Do not shave anywhere for at least 12 hours prior to first CHG shower.  Do not bring valuables to the hospital. Contacts, dentures or bridgework may not be worn into surgery.  Patients discharged on the day of surgery will not be allowed to drive home.  Home with sister Huntley Dec

## 2012-03-30 ENCOUNTER — Ambulatory Visit (HOSPITAL_COMMUNITY): Payer: 59 | Admitting: Anesthesiology

## 2012-03-30 ENCOUNTER — Encounter (HOSPITAL_COMMUNITY): Payer: Self-pay | Admitting: Anesthesiology

## 2012-03-30 ENCOUNTER — Encounter (HOSPITAL_COMMUNITY)
Admission: RE | Disposition: A | Discharge status: Home / Self Care | Payer: Self-pay | Source: Ambulatory Visit | Attending: Gynecology

## 2012-03-30 ENCOUNTER — Ambulatory Visit (HOSPITAL_COMMUNITY)
Admission: RE | Admit: 2012-03-30 | Discharge: 2012-03-30 | Disposition: A | Discharge status: Home / Self Care | Payer: 59 | Source: Ambulatory Visit | Attending: Gynecology | Admitting: Gynecology

## 2012-03-30 DIAGNOSIS — Z302 Encounter for sterilization: Secondary | ICD-10-CM | POA: Insufficient documentation

## 2012-03-30 HISTORY — PX: LAPAROSCOPIC TUBAL LIGATION: SHX1937

## 2012-03-30 SURGERY — LIGATION, FALLOPIAN TUBE, LAPAROSCOPIC
Anesthesia: General | Site: Abdomen | Laterality: Bilateral | Wound class: Clean Contaminated

## 2012-03-30 MED ORDER — ROCURONIUM BROMIDE 50 MG/5ML IV SOLN
INTRAVENOUS | Status: AC
  Filled 2012-03-30: qty 1

## 2012-03-30 MED ORDER — MIDAZOLAM HCL 2 MG/2ML IJ SOLN: 0.5000 mg | Freq: Once | INTRAMUSCULAR | Status: DC | PRN

## 2012-03-30 MED ORDER — MIDAZOLAM HCL 5 MG/5ML IJ SOLN
INTRAMUSCULAR | Status: DC | PRN
  Administered 2012-03-30: 2 mg via INTRAVENOUS

## 2012-03-30 MED ORDER — NEOSTIGMINE METHYLSULFATE 1 MG/ML IJ SOLN
INTRAMUSCULAR | Status: DC | PRN
  Administered 2012-03-30: 4 mg via INTRAVENOUS

## 2012-03-30 MED ORDER — ONDANSETRON HCL 4 MG/2ML IJ SOLN
INTRAMUSCULAR | Status: DC | PRN
  Administered 2012-03-30: 4 mg via INTRAVENOUS

## 2012-03-30 MED ORDER — NEOSTIGMINE METHYLSULFATE 1 MG/ML IJ SOLN
INTRAMUSCULAR | Status: AC
  Filled 2012-03-30: qty 1

## 2012-03-30 MED ORDER — PROPOFOL 10 MG/ML IV EMUL
INTRAVENOUS | Status: AC
  Filled 2012-03-30: qty 20

## 2012-03-30 MED ORDER — PROPOFOL 10 MG/ML IV BOLUS
INTRAVENOUS | Status: DC | PRN
  Administered 2012-03-30: 200 mg via INTRAVENOUS

## 2012-03-30 MED ORDER — GLYCOPYRROLATE 0.2 MG/ML IJ SOLN
INTRAMUSCULAR | Status: AC
  Filled 2012-03-30: qty 4

## 2012-03-30 MED ORDER — LIDOCAINE HCL (CARDIAC) 20 MG/ML IV SOLN
INTRAVENOUS | Status: AC
  Filled 2012-03-30: qty 5

## 2012-03-30 MED ORDER — HYDROMORPHONE HCL PF 1 MG/ML IJ SOLN
INTRAMUSCULAR | Status: AC
  Administered 2012-03-30: 0.5 mg via INTRAVENOUS
  Filled 2012-03-30: qty 1

## 2012-03-30 MED ORDER — HYDROMORPHONE HCL PF 1 MG/ML IJ SOLN
0.2500 mg | INTRAMUSCULAR | Status: DC | PRN
  Administered 2012-03-30 (×2): 0.5 mg via INTRAVENOUS

## 2012-03-30 MED ORDER — PROMETHAZINE HCL 25 MG/ML IJ SOLN: 6.2500 mg | INTRAMUSCULAR | Status: DC | PRN

## 2012-03-30 MED ORDER — HYDROCODONE-ACETAMINOPHEN 5-500 MG PO TABS: 1.0000 | ORAL_TABLET | Freq: Four times a day (QID) | ORAL | Status: DC | PRN

## 2012-03-30 MED ORDER — ONDANSETRON HCL 4 MG/2ML IJ SOLN
INTRAMUSCULAR | Status: AC
  Filled 2012-03-30: qty 2

## 2012-03-30 MED ORDER — DEXTROSE-NACL 5-0.9 % IV SOLN: INTRAVENOUS | Status: DC

## 2012-03-30 MED ORDER — DEXAMETHASONE SODIUM PHOSPHATE 10 MG/ML IJ SOLN
INTRAMUSCULAR | Status: AC
  Filled 2012-03-30: qty 1

## 2012-03-30 MED ORDER — FENTANYL CITRATE 0.05 MG/ML IJ SOLN
25.0000 ug | INTRAMUSCULAR | Status: DC | PRN
  Administered 2012-03-30 (×2): 50 ug via INTRAVENOUS

## 2012-03-30 MED ORDER — FENTANYL CITRATE 0.05 MG/ML IJ SOLN
INTRAMUSCULAR | Status: AC
  Administered 2012-03-30: 50 ug via INTRAVENOUS
  Filled 2012-03-30: qty 2

## 2012-03-30 MED ORDER — FENTANYL CITRATE 0.05 MG/ML IJ SOLN
INTRAMUSCULAR | Status: AC
  Filled 2012-03-30: qty 2

## 2012-03-30 MED ORDER — MEPERIDINE HCL 25 MG/ML IJ SOLN: 6.2500 mg | INTRAMUSCULAR | Status: DC | PRN

## 2012-03-30 MED ORDER — LIDOCAINE HCL (CARDIAC) 20 MG/ML IV SOLN
INTRAVENOUS | Status: DC | PRN
  Administered 2012-03-30: 80 mg via INTRAVENOUS

## 2012-03-30 MED ORDER — PROPOFOL 10 MG/ML IV BOLUS: INTRAVENOUS | Status: DC | PRN

## 2012-03-30 MED ORDER — LACTATED RINGERS IV SOLN
INTRAVENOUS | Status: DC
  Administered 2012-03-30: 06:00:00 via INTRAVENOUS
  Administered 2012-03-30: 50 mL/h via INTRAVENOUS
  Administered 2012-03-30: 08:00:00 via INTRAVENOUS

## 2012-03-30 MED ORDER — BUPIVACAINE HCL (PF) 0.25 % IJ SOLN
INTRAMUSCULAR | Status: DC | PRN
  Administered 2012-03-30: 4 mL

## 2012-03-30 MED ORDER — FENTANYL CITRATE 0.05 MG/ML IJ SOLN
INTRAMUSCULAR | Status: DC | PRN
  Administered 2012-03-30 (×5): 50 ug via INTRAVENOUS

## 2012-03-30 MED ORDER — KETOROLAC TROMETHAMINE 30 MG/ML IJ SOLN: 15.0000 mg | Freq: Once | INTRAMUSCULAR | Status: AC | PRN

## 2012-03-30 MED ORDER — DEXAMETHASONE SODIUM PHOSPHATE 10 MG/ML IJ SOLN
INTRAMUSCULAR | Status: DC | PRN
  Administered 2012-03-30: 10 mg via INTRAVENOUS

## 2012-03-30 MED ORDER — ROCURONIUM BROMIDE 100 MG/10ML IV SOLN
INTRAVENOUS | Status: DC | PRN
  Administered 2012-03-30: 5 mg via INTRAVENOUS
  Administered 2012-03-30: 35 mg via INTRAVENOUS

## 2012-03-30 MED ORDER — FENTANYL CITRATE 0.05 MG/ML IJ SOLN
INTRAMUSCULAR | Status: AC
  Filled 2012-03-30: qty 5

## 2012-03-30 MED ORDER — GLYCOPYRROLATE 0.2 MG/ML IJ SOLN
INTRAMUSCULAR | Status: DC | PRN
  Administered 2012-03-30: .8 mg via INTRAVENOUS

## 2012-03-30 MED ORDER — BUPIVACAINE HCL (PF) 0.25 % IJ SOLN
INTRAMUSCULAR | Status: AC
  Filled 2012-03-30: qty 30

## 2012-03-30 MED ORDER — MIDAZOLAM HCL 2 MG/2ML IJ SOLN
INTRAMUSCULAR | Status: AC
  Filled 2012-03-30: qty 2

## 2012-03-30 MED ORDER — KETOROLAC TROMETHAMINE 30 MG/ML IJ SOLN
INTRAMUSCULAR | Status: AC
  Administered 2012-03-30: 30 mg via INTRAVENOUS
  Filled 2012-03-30: qty 1

## 2012-03-30 SURGICAL SUPPLY — 17 items
ADH SKN CLS APL DERMABOND .7 (GAUZE/BANDAGES/DRESSINGS) ×1
BLADE SURG 15 STRL LF C SS BP (BLADE) ×1 IMPLANT
BLADE SURG 15 STRL SS (BLADE) ×2
CATH ROBINSON RED A/P 16FR (CATHETERS) ×2 IMPLANT
CLOTH BEACON ORANGE TIMEOUT ST (SAFETY) ×2 IMPLANT
DERMABOND ADVANCED (GAUZE/BANDAGES/DRESSINGS) ×1
DERMABOND ADVANCED .7 DNX12 (GAUZE/BANDAGES/DRESSINGS) IMPLANT
GLOVE BIO SURGEON STRL SZ7.5 (GLOVE) ×3 IMPLANT
GOWN PREVENTION PLUS LG XLONG (DISPOSABLE) ×3 IMPLANT
GOWN STRL REIN XL XLG (GOWN DISPOSABLE) ×1 IMPLANT
PACK LAPAROSCOPY BASIN (CUSTOM PROCEDURE TRAY) ×2 IMPLANT
RING FALLOPIAN BANDS (Ring) ×1 IMPLANT
SUT PLAIN 3 0 X 1 18 (SUTURE) ×2 IMPLANT
SUT VICRYL 0 UR6 27IN ABS (SUTURE) ×2 IMPLANT
TOWEL OR 17X24 6PK STRL BLUE (TOWEL DISPOSABLE) ×4 IMPLANT
TROCAR Z-THREAD FIOS 11X100 BL (TROCAR) ×2 IMPLANT
WATER STERILE IRR 1000ML POUR (IV SOLUTION) ×2 IMPLANT

## 2012-03-30 NOTE — Anesthesia Procedure Notes (Signed)
Procedure Name: Intubation Date/Time: 03/30/2012 7:34 AM Performed by: Graciela Husbands Pre-anesthesia Checklist: Suction available, Emergency Drugs available, Timeout performed, Patient identified and Patient being monitored Patient Re-evaluated:Patient Re-evaluated prior to inductionOxygen Delivery Method: Circle system utilized Preoxygenation: Pre-oxygenation with 100% oxygen Intubation Type: IV induction Ventilation: Mask ventilation without difficulty Laryngoscope Size: Mac and 3 Grade View: Grade I Tube size: 7.0 mm Number of attempts: 1 Airway Equipment and Method: Stylet Placement Confirmation: ETT inserted through vocal cords under direct vision,  positive ETCO2 and breath sounds checked- equal and bilateral Secured at: 20 cm Tube secured with: Tape Dental Injury: Teeth and Oropharynx as per pre-operative assessment

## 2012-03-30 NOTE — Anesthesia Postprocedure Evaluation (Signed)
  Anesthesia Post-op Note  Patient: Leslie Ritter  Procedure(s) Performed: Procedure(s): LAPAROSCOPIC TUBAL LIGATION (Bilateral)  Patient Location: PACU  Anesthesia Type:General  Level of Consciousness: awake, alert  and oriented  Airway and Oxygen Therapy: Patient Spontanous Breathing  Post-op Pain: none  Post-op Assessment: Post-op Vital signs reviewed, Patient's Cardiovascular Status Stable, Respiratory Function Stable, Patent Airway, No signs of Nausea or vomiting and Pain level controlled  Post-op Vital Signs: Reviewed and stable  Complications: No apparent anesthesia complications

## 2012-03-30 NOTE — Anesthesia Preprocedure Evaluation (Signed)
Anesthesia Evaluation  Patient identified by MRN, date of birth, ID band Patient awake    Reviewed: Allergy & Precautions, H&P , Patient's Chart, lab work & pertinent test results, reviewed documented beta blocker date and time   History of Anesthesia Complications Negative for: history of anesthetic complications  Airway Mallampati: II TM Distance: >3 FB Neck ROM: full    Dental no notable dental hx.    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 LGSIL (low grade squamous intraepithelial dysplasia) 06/2007 06/2007 C&B LGSIL, 11/09 PAP WNL    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 Anxiety        HSV-1 infection     SVD (spontaneous vaginal delivery)   x 1    Hypertension    Reproductive/Obstetrics negative OB ROS                           Anesthesia Physical Anesthesia Plan  ASA: II  Anesthesia Plan: General ETT   Post-op Pain Management:    Induction:   Airway Management Planned:   Additional Equipment:   Intra-op Plan:   Post-operative Plan:   Informed Consent: I have reviewed the patients History and Physical, chart, labs and discussed the procedure including the risks, benefits and alternatives for the proposed anesthesia with the patient or authorized representative who has indicated his/her understanding and acceptance.   Dental Advisory  Given  Plan Discussed with: CRNA and Surgeon  Anesthesia Plan Comments:         Anesthesia Quick Evaluation

## 2012-03-30 NOTE — H&P (Signed)
  The patient was examined.  I reviewed the proposed surgery and consent form with the patient.  The dictated history and physical is current and accurate and all questions were answered. The patient is ready to proceed with surgery and has a realistic understanding and expectation for the outcome.   Dara Lords MD, 7:15 AM 03/30/2012

## 2012-03-30 NOTE — Transfer of Care (Signed)
Immediate Anesthesia Transfer of Care Note  Patient: Leslie Ritter  Procedure(s) Performed: Procedure(s): LAPAROSCOPIC TUBAL LIGATION (Bilateral)  Patient Location: PACU  Anesthesia Type:General  Level of Consciousness: awake, alert  and oriented  Airway & Oxygen Therapy: Patient Spontanous Breathing and Patient connected to nasal cannula oxygen  Post-op Assessment: Report given to PACU RN and Post -op Vital signs reviewed and stable  Post vital signs: Reviewed and stable  Complications: No apparent anesthesia complications

## 2012-03-30 NOTE — Op Note (Signed)
Leslie Ritter 04-May-1970 [id number]   Post Operative Note   Date of surgery:  03/30/2012  Pre Op Dx:  Desires permanent sterilization  Post Op Dx:  Desires permanent sterilization  Procedure:  Laparoscopic tubal sterilization, Falope ring technique  Surgeon:  Dara Lords  Anesthesia:  General  EBL:  Minimal  Complications:  None  Specimen:  None  Findings:   EUA:  External BUS vagina normal. Cervix normal. Uterus anteverted normal size shape and contour. Adnexa without masses.     Operative: Anterior cul-de-sac normal. Posterior cul-de-sac normal. Uterus normal size, shape and contour. Right and left fallopian tubes normal length, caliber, fimbriated ends. Single Falope ring applied to mid tubal segment on each side. Right and left ovaries grossly normal freely mobile. No evidence of pelvic endometriosis or adhesive disease. Upper abdominal exam shows liver smooth no abnormalities. Gallbladder not visualized. Appendix grossly normal freely mobile. No overt upper abdominal pathology.   Procedure:  The patient was taken to the operating room, placed in the low dorsal lithotomy position, underwent general anesthesia, received an abdominal/perineal/vaginal preparation with Betadine solution, bladder emptied with an in and out Foley catheterization and EUA was performed. The timeout was performed by the surgical team. The cervix was visualized with a speculum and a Hulka tenaculum was placed without difficulty. The patient was draped in the usual fashion. A transverse infraumbilical incision was made and using the 10 mm direct entry trocar the abdomen was correctly entered under direct visualization without difficulty. The abdomen was then insufflated, examination of the pelvic organs and upper abdominal exam was performed with findings noted above. A midline suprapubic incision was made and the Falope ring applied trocar was placed under tract visualization after transillumination for  the vessels without difficulty. The left fallopian tube was identified, traced from its insertion to its fimbriated end and a mid tubal segment was grasped and delivered into the Falope ring applying device and a single Falope ring was applied without difficulty.  A good knuckle of tube was within the ring, appropriate blanching occurred afterwards and manipulation assured secure placement. A similar procedure was carried out on the other side. The Falope ring port was then removed, the gas allowed to escape and the infraumbilical port was backed out under direct visualization showing adequate hemostasis and no evidence of hernia formation. Both skin incisions were injected using 0.25% Marcaine and the infraumbilical port was closed using 0 Vicryl suture in an interrupted subcutaneous fascial stitch. Both skin incisions were closed using Dermabond skin adhesive. The Hulka tenaculum was removed. The patient was awakened without difficulty and taken to the recovery room in good condition having tolerated the procedure well.     Dara Lords MD, 8:34 AM 03/30/2012

## 2012-03-31 ENCOUNTER — Encounter (HOSPITAL_COMMUNITY): Payer: Self-pay | Admitting: Gynecology

## 2012-04-15 ENCOUNTER — Encounter: Payer: Self-pay | Admitting: Gynecology

## 2012-04-15 ENCOUNTER — Ambulatory Visit (INDEPENDENT_AMBULATORY_CARE_PROVIDER_SITE_OTHER): Payer: 59 | Admitting: Gynecology

## 2012-04-15 DIAGNOSIS — Z09 Encounter for follow-up examination after completed treatment for conditions other than malignant neoplasm: Secondary | ICD-10-CM

## 2012-04-15 NOTE — Progress Notes (Signed)
Postop visit status post laparoscopic Falope ring tubal sterilization. Doing well without complaints.  Exam was Leslie Ritter Abdomen soft nontender without masses guarding rebound organomegaly. Incision is healing nicely. Pelvic external BUS vagina normal. Bimanual without masses or tenderness.  Assessment and plan: Normal postop check status post laparoscopic Falope ring tubal sterilization. Patient will resume all normal activities. Followup in December 2014 when due for her annual exam

## 2012-04-15 NOTE — Patient Instructions (Signed)
Resume all normal activities. Followup December 2014 for annual exam.

## 2012-04-19 ENCOUNTER — Encounter: Payer: Self-pay | Admitting: Gynecology

## 2012-04-19 ENCOUNTER — Ambulatory Visit (INDEPENDENT_AMBULATORY_CARE_PROVIDER_SITE_OTHER): Payer: 59 | Admitting: Women's Health

## 2012-04-19 ENCOUNTER — Encounter: Payer: Self-pay | Admitting: Women's Health

## 2012-04-19 DIAGNOSIS — N898 Other specified noninflammatory disorders of vagina: Secondary | ICD-10-CM

## 2012-04-19 DIAGNOSIS — F411 Generalized anxiety disorder: Secondary | ICD-10-CM

## 2012-04-19 LAB — WET PREP FOR TRICH, YEAST, CLUE: Yeast Wet Prep HPF POC: NONE SEEN

## 2012-04-19 MED ORDER — FLUOXETINE HCL 20 MG PO CAPS: 20.0000 mg | ORAL_CAPSULE | Freq: Every day | ORAL | Status: DC

## 2012-04-19 MED ORDER — METRONIDAZOLE 0.75 % VA GEL: VAGINAL | Status: DC

## 2012-04-19 NOTE — Progress Notes (Signed)
Patient ID: Leslie Ritter, female   DOB: 1970-12-19, 42 y.o.   MRN: [id number] Presents with complaint of vaginal discharge with odor. Denies vaginal itching, urinary symptoms or abdominal pain. Currently on Zoloft, long-term history of anxiety with depression, and feels it is not effective. Has seen a counselor in the past denies need, states most family members have anxiety issues. Monthly cycles/BTL/new partner. Would like to try something else, had been on Lexapro for many years and it lost its effectiveness. Denies any suicidal ideation.  Exam: Appears well, external genitalia within normal limits, speculum exam moderate amount of a white discharge with odor present wet prep positive for amines, clues, many bacteria. GC/Chlamydia culture taken. Bimanual no CMT or adnexal fullness or tenderness.  Bacteria vaginosis Anxiety with depression  Plan: MetroGel vaginal cream 1 applicator at bedtime x5, alcohol precautions reviewed, prescription, proper use given and reviewed. GC/Chlamydia culture pending. Declines need for HIV, hepatitis or RPR. Condoms encouraged until permanent partner. Options reviewed, declines need for counseling. Will try Prozac 20 mg daily will taper down on Zoloft 50 and add in Prozac. Instructed to call if no relief. Reviewed importance of exercise, healthy diet in relationship to mood and anxiety.

## 2012-08-12 ENCOUNTER — Other Ambulatory Visit: Payer: Self-pay | Admitting: Women's Health

## 2012-08-12 ENCOUNTER — Telehealth: Payer: Self-pay | Admitting: *Deleted

## 2012-08-12 MED ORDER — ESCITALOPRAM OXALATE 20 MG PO TABS: 20.0000 mg | ORAL_TABLET | Freq: Every day | ORAL | Status: DC

## 2012-08-12 MED ORDER — ALPRAZOLAM 0.25 MG PO TABS: 0.2500 mg | ORAL_TABLET | Freq: Every evening | ORAL | Status: DC | PRN

## 2012-08-12 NOTE — Telephone Encounter (Signed)
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 States is not sleeping well at night, states had been on Lexapro for a long time and would like to go back on. Will wean down off the Prozac and add Lexapro to 20 mg daily. Strongly encouraged to schedule counseling appointment, has had in the past. Xanax 0.25 uses less than one prescription per year, is aware of addictive properties. Instructed to call if no relief of symptoms.

## 2012-08-12 NOTE — Telephone Encounter (Signed)
Pt is currently taking Prozac 20 mg and has noticed she is feeling very emotional, she is having some stress related issues at work. Pt said she would start crying and nonething could be wrong. Pt said it doesn't happen around her cycle time, she has taking lexapro in past, asked if you have any recommendations? Please advise

## 2012-09-16 ENCOUNTER — Other Ambulatory Visit: Payer: Self-pay | Admitting: Nurse Practitioner

## 2012-09-22 ENCOUNTER — Other Ambulatory Visit: Payer: Self-pay | Admitting: Nurse Practitioner

## 2012-09-23 ENCOUNTER — Other Ambulatory Visit: Payer: Self-pay | Admitting: Nurse Practitioner

## 2012-09-23 NOTE — Telephone Encounter (Signed)
Last seen 02/25/12   ACM  If approved route to nurse to phone in

## 2012-09-25 NOTE — Telephone Encounter (Signed)
Last seen 12/13, confused! Med list says xanax and it was just filled 08/12/12 with 1 RF

## 2012-09-27 NOTE — Telephone Encounter (Signed)
Find out what he is takingp

## 2012-12-03 ENCOUNTER — Encounter (INDEPENDENT_AMBULATORY_CARE_PROVIDER_SITE_OTHER): Payer: Self-pay

## 2012-12-03 ENCOUNTER — Encounter: Payer: Self-pay | Admitting: Nurse Practitioner

## 2012-12-03 ENCOUNTER — Ambulatory Visit (INDEPENDENT_AMBULATORY_CARE_PROVIDER_SITE_OTHER): Payer: 59 | Admitting: Nurse Practitioner

## 2012-12-03 VITALS — BP 115/74 | HR 76 | Temp 97.7°F | Ht 62.0 in | Wt 141.0 lb

## 2012-12-03 DIAGNOSIS — F411 Generalized anxiety disorder: Secondary | ICD-10-CM

## 2012-12-03 DIAGNOSIS — F419 Anxiety disorder, unspecified: Secondary | ICD-10-CM

## 2012-12-03 DIAGNOSIS — I10 Essential (primary) hypertension: Secondary | ICD-10-CM | POA: Insufficient documentation

## 2012-12-03 MED ORDER — ESCITALOPRAM OXALATE 20 MG PO TABS: 20.0000 mg | ORAL_TABLET | Freq: Every day | ORAL | Status: DC

## 2012-12-03 MED ORDER — LISINOPRIL-HYDROCHLOROTHIAZIDE 20-12.5 MG PO TABS: 1.0000 | ORAL_TABLET | Freq: Every day | ORAL | Status: DC

## 2012-12-03 NOTE — Patient Instructions (Signed)
Stress Management Stress is a state of physical or mental tension that often results from changes in your life or normal routine. Some common causes of stress are:  Death of a loved one.  Injuries or severe illnesses.  Getting fired or changing jobs.  Moving into a new home. Other causes may be:  Sexual problems.  Business or financial losses.  Taking on a large debt.  Regular conflict with someone at home or at work.  Constant tiredness from lack of sleep. It is not just bad things that are stressful. It may be stressful to:  Win the lottery.  Get married.  Buy a new car. The amount of stress that can be easily tolerated varies from person to person. Changes generally cause stress, regardless of the types of change. Too much stress can affect your health. It may lead to physical or emotional problems. Too little stress (boredom) may also become stressful. SUGGESTIONS TO REDUCE STRESS:  Talk things over with your family and friends. It often is helpful to share your concerns and worries. If you feel your problem is serious, you may want to get help from a professional counselor.  Consider your problems one at a time instead of lumping them all together. Trying to take care of everything at once may seem impossible. List all the things you need to do and then start with the most important one. Set a goal to accomplish 2 or 3 things each day. If you expect to do too many in a single day you will naturally fail, causing you to feel even more stressed.  Do not use alcohol or drugs to relieve stress. Although you may feel better for a short time, they do not remove the problems that caused the stress. They can also be habit forming.  Exercise regularly - at least 3 times per week. Physical exercise can help to relieve that "uptight" feeling and will relax you.  The shortest distance between despair and hope is often a good night's sleep.  Go to bed and get up on time allowing  yourself time for appointments without being rushed.  Take a short "time-out" period from any stressful situation that occurs during the day. Close your eyes and take some deep breaths. Starting with the muscles in your face, tense them, hold it for a few seconds, then relax. Repeat this with the muscles in your neck, shoulders, hand, stomach, back and legs.  Take good care of yourself. Eat a balanced diet and get plenty of rest.  Schedule time for having fun. Take a break from your daily routine to relax. HOME CARE INSTRUCTIONS   Call if you feel overwhelmed by your problems and feel you can no longer manage them on your own.  Return immediately if you feel like hurting yourself or someone else. Document Released: 07/02/2000 Document Revised: 03/31/2011 Document Reviewed: 08/31/2012 [employer] Patient Information 2014 [employer].  

## 2012-12-03 NOTE — Progress Notes (Signed)
  Subjective:    Patient ID: Leslie Ritter, female    DOB: 04-Mar-1970, 42 y.o.   MRN: [id number]  Hypertension This is a chronic problem. The current episode started more than 1 year ago. The problem is unchanged. The problem is controlled. Pertinent negatives include no blurred vision, headaches, neck pain, orthopnea, palpitations, peripheral edema or shortness of breath. There are no associated agents to hypertension. Past treatments include ACE inhibitors and diuretics. The current treatment provides mild improvement. Compliance problems include diet.   Depression Lexapro working well- patient denies any side effects   Review of Systems  Eyes: Negative for blurred vision.  Respiratory: Negative for shortness of breath.   Cardiovascular: Negative for palpitations and orthopnea.  Musculoskeletal: Negative for neck pain.  Neurological: Negative for headaches.  All other systems reviewed and are negative.       Objective:   Physical Exam  Constitutional: She is oriented to person, place, and time. She appears well-developed and well-nourished.  HENT:  Nose: Nose normal.  Mouth/Throat: Oropharynx is clear and moist.  Eyes: EOM are normal.  Neck: Trachea normal, normal range of motion and full passive range of motion without pain. Neck supple. No JVD present. Carotid bruit is not present. No thyromegaly present.  Cardiovascular: Normal rate, regular rhythm, normal heart sounds and intact distal pulses.  Exam reveals no gallop and no friction rub.   No murmur heard. Pulmonary/Chest: Effort normal and breath sounds normal.  Abdominal: Soft. Bowel sounds are normal. She exhibits no distension and no mass. There is no tenderness.  Musculoskeletal: Normal range of motion.  Lymphadenopathy:    She has no cervical adenopathy.  Neurological: She is alert and oriented to person, place, and time. She has normal reflexes.  Skin: Skin is warm and dry.  Psychiatric: She has a normal mood and  affect. Her behavior is normal. Judgment and thought content normal.    BP 115/74  Pulse 76  Temp(Src) 97.7 F (36.5 C) (Oral)  Ht 5\' 2"  (1.575 m)  Wt 141 lb (63.957 kg)  BMI 25.78 kg/m2       Assessment & Plan:   1. Hypertension   2. Anxiety    Orders Placed This Encounter  Procedures  . CMP14+EGFR  . NMR, lipoprofile   Meds ordered this encounter  Medications  . DISCONTD: fluconazole (DIFLUCAN) 100 MG tablet    Sig:   . DISCONTD: valACYclovir (VALTREX) 1000 MG tablet    Sig:   . lisinopril-hydrochlorothiazide (PRINZIDE,ZESTORETIC) 20-12.5 MG per tablet    Sig: Take 1 tablet by mouth daily.    Dispense:  90 tablet    Refill:  1    Must be seen before next refill    Order Specific Question:  Supervising Provider    Answer:  Ernestina Penna [[id number]]  . escitalopram (LEXAPRO) 20 MG tablet    Sig: Take 1 tablet (20 mg total) by mouth daily.    Dispense:  90 tablet    Refill:  1    Order Specific Question:  Supervising Provider    Answer:  Dara Lords [[id number]]    Continue all meds Labs pending Diet and exercise encouraged Health maintenance reviewed Follow up in 3 months  Mary-Margaret Daphine Deutscher, FNP

## 2012-12-04 LAB — NMR, LIPOPROFILE
Cholesterol: 151 mg/dL (ref <200)
HDL Particle Number: 36.5 umol/L (ref >=30.5)
LDL Size: 21.6 nm (ref >20.5)
LP-IR Score: 33 (ref <=45)
Triglycerides by NMR: 76 mg/dL (ref <150)

## 2012-12-04 LAB — CMP14+EGFR
Albumin: 4.6 g/dL (ref 3.5–5.5)
Alkaline Phosphatase: 47 IU/L (ref 39–117)
BUN/Creatinine Ratio: 19 (ref 9–23)
BUN: 15 mg/dL (ref 6–24)
Creatinine, Ser: 0.77 mg/dL (ref 0.57–1.00)
GFR calc Af Amer: 110 mL/min/{1.73_m2} (ref >59)
GFR calc non Af Amer: 96 mL/min/{1.73_m2} (ref >59)
Globulin, Total: 2.2 g/dL (ref 1.5–4.5)
Glucose: 89 mg/dL (ref 65–99)
Total Bilirubin: 0.4 mg/dL (ref 0.0–1.2)
Total Protein: 6.8 g/dL (ref 6.0–8.5)

## 2013-01-21 ENCOUNTER — Encounter: Payer: 59 | Admitting: Women's Health

## 2013-01-21 ENCOUNTER — Other Ambulatory Visit: Payer: Self-pay

## 2013-01-21 DIAGNOSIS — Z1231 Encounter for screening mammogram for malignant neoplasm of breast: Secondary | ICD-10-CM

## 2013-02-04 ENCOUNTER — Ambulatory Visit (INDEPENDENT_AMBULATORY_CARE_PROVIDER_SITE_OTHER): Payer: 59 | Admitting: Women's Health

## 2013-02-04 ENCOUNTER — Other Ambulatory Visit (HOSPITAL_COMMUNITY)
Admission: RE | Admit: 2013-02-04 | Discharge: 2013-02-04 | Disposition: A | Discharge status: Home / Self Care | Payer: 59 | Source: Ambulatory Visit | Attending: Gynecology | Admitting: Gynecology

## 2013-02-04 ENCOUNTER — Encounter: Payer: Self-pay | Admitting: Women's Health

## 2013-02-04 VITALS — BP 118/78 | Ht 61.5 in | Wt 138.0 lb

## 2013-02-04 DIAGNOSIS — Z01419 Encounter for gynecological examination (general) (routine) without abnormal findings: Secondary | ICD-10-CM

## 2013-02-04 DIAGNOSIS — N946 Dysmenorrhea, unspecified: Secondary | ICD-10-CM

## 2013-02-04 DIAGNOSIS — F411 Generalized anxiety disorder: Secondary | ICD-10-CM

## 2013-02-04 MED ORDER — IBUPROFEN 600 MG PO TABS: 600.0000 mg | ORAL_TABLET | Freq: Three times a day (TID) | ORAL | Status: DC | PRN

## 2013-02-04 MED ORDER — ALPRAZOLAM 0.25 MG PO TABS: 0.2500 mg | ORAL_TABLET | Freq: Every evening | ORAL | Status: DC | PRN

## 2013-02-04 MED ORDER — ESCITALOPRAM OXALATE 20 MG PO TABS: ORAL_TABLET | ORAL | Status: DC

## 2013-02-04 NOTE — Progress Notes (Signed)
Leslie Ritter [birth year] [id number]    History:    Presents for annual exam.   Monthly cycle/BTL 03/2012/new partner with negative screen.   Cryo- 1992. 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 Depression/anxiety currently on Lexapro 20 with relief. Has had counseling in the past and denies need for at this time. History of HTN, primary care manages. History of HSV 1. Kidney stone 2004   Past medical history, past surgical history, family history and social history were all reviewed and documented in the EPIC chart. Works at Production [employer], radio as Environmental consultant. Engaged November 2014. Tyler 19,  doing well. Mother and sister HTN.  ROS:  A  ROS was performed and pertinent positives and negatives are included.  Exam:  Filed Vitals:   02/04/13 1159  BP: 118/78    General appearance:  Normal Thyroid:  Symmetrical, normal in size, without palpable masses or nodularity. Respiratory  Auscultation:  Clear without wheezing or rhonchi Cardiovascular  Auscultation:  Regular rate, without rubs, murmurs or gallops  Edema/varicosities:  Not grossly evident Abdominal  Soft,nontender, without masses, guarding or rebound.  Liver/spleen:  No organomegaly noted  Hernia:  None appreciated  Skin  Inspection:  Grossly normal   Breasts: Examined lying and sitting.     Right: Without masses, retractions, discharge or axillary adenopathy.     Left: Without masses, retractions, discharge or axillary adenopathy. Gentitourinary   Inguinal/mons:  Normal without inguinal adenopathy  External genitalia:  Normal  BUS/Urethra/Skene's glands:  Normal  Vagina:  Normal  Cervix:  Normal  Uterus:  Normal in size, shape and contour.  Midline and mobile  Adnexa/parametria:     Rt: Without masses or tenderness.   Lt: Without masses or tenderness.  Anus and perineum: Normal  Digital rectal exam: Normal sphincter tone without palpated masses or  tenderness  Assessment/Plan:  43 y.o. DWF G1P1 for annual exam without complaint.  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 Prescription, proper use given and reviewed.  Xanax 0.25 mg prn, addictive properties reviewed, prescription proper use given and reviewed.Pap. Last normal pap 12/ 2013.  Labs at primary care. Continued exercise and healthy lifestyle.  SBE's, continue annual mammogram, last mammogram 3/14, dense breasts, 3-D tomography encouraged, calcium rich diet, vitamin D 1000 daily encouraged. Motrin 600 every 8 hours with cycle, instructed to call if no relief.   Huel Cote [facility], 12:33 PM 02/04/2013

## 2013-02-04 NOTE — Patient Instructions (Signed)

## 2013-02-28 ENCOUNTER — Encounter: Payer: Self-pay | Admitting: Gynecology

## 2013-02-28 ENCOUNTER — Ambulatory Visit (INDEPENDENT_AMBULATORY_CARE_PROVIDER_SITE_OTHER): Payer: 59 | Admitting: Gynecology

## 2013-02-28 DIAGNOSIS — IMO0002 Reserved for concepts with insufficient information to code with codable children: Secondary | ICD-10-CM

## 2013-02-28 DIAGNOSIS — R6889 Other general symptoms and signs: Secondary | ICD-10-CM

## 2013-02-28 NOTE — Progress Notes (Signed)
Patient ID: Leslie Ritter, female   DOB: [birth year], 43 y.o.   MRN: [id number]  Patient presents for colposcopy. History of cryosurgery 1992 pathology unknown. Subsequent history of HGSIL on Pap smear 2008 with positive high-risk HPV. Colposcopic biopsies LGSIL 2009. Pap smears have been normal since then until most recent annual exam 01/2013 where her Pap smear showed LGSIL cannot rule out higher grade lesion.  Exam with Irven Shelling External BUS vagina normal. Cervix grossly normal. Uterus normal size midline mobile nontender. Adnexa without masses or tenderness.  Colposcopy after acetic acid cleanse is adequate. Area of acetowhite change 12:00 transformation zone. Biopsy taken/ECC performed. Physical Exam  Genitourinary:       Assessment and plan: LGSIL Pap smear cannot rule out high-grade lesion. Acetowhite change 12:00 transformations. Biopsy taken and ECC performed. Past history of dysplasia status post cryo- in 1992. 2008/2009 LGSIL with expectant management.  Reviewed with her the whole issue of dysplasia high grade/low grade, progression/regression.  Patient will followup for biopsy results.

## 2013-02-28 NOTE — Patient Instructions (Signed)
Colposcopy Care After Colposcopy is a procedure in which a special tool is used to magnify the surface of the cervix. A tissue sample (biopsy) may also be taken. This sample will be looked at for cervical cancer or other problems. After the test:  You may have some cramping.  Lie down for a few minutes if you feel lightheaded.   You may have some bleeding which should stop in a few days. HOME CARE  Do not have sex or use tampons for 2 to 3 days or as told.  Only take medicine as told by your doctor.  Continue to take your birth control pills as usual. Finding out the results of your test Ask when your test results will be ready. Make sure you get your test results. GET HELP RIGHT AWAY IF:  You are bleeding a lot or are passing blood clots.  You develop a fever of 102 F (38.9 C) or higher.  You have abnormal vaginal discharge.  You have cramps that do not go away with medicine.  You feel lightheaded, dizzy, or pass out (faint). MAKE SURE YOU:   Understand these instructions.  Will watch your condition.  Will get help right away if you are not doing well or get worse. Document Released: 06/25/2007 Document Revised: 03/31/2011 Document Reviewed: 08/05/2012 ExitCare Patient Information 2014 [employer].  

## 2013-03-02 ENCOUNTER — Encounter: Payer: Self-pay | Admitting: Gynecology

## 2013-03-21 ENCOUNTER — Encounter: Payer: Self-pay | Admitting: Gynecology

## 2013-03-21 ENCOUNTER — Ambulatory Visit (INDEPENDENT_AMBULATORY_CARE_PROVIDER_SITE_OTHER): Payer: 59 | Admitting: Gynecology

## 2013-03-21 DIAGNOSIS — R6889 Other general symptoms and signs: Secondary | ICD-10-CM

## 2013-03-21 DIAGNOSIS — IMO0002 Reserved for concepts with insufficient information to code with codable children: Secondary | ICD-10-CM

## 2013-03-21 DIAGNOSIS — N9089 Other specified noninflammatory disorders of vulva and perineum: Secondary | ICD-10-CM

## 2013-03-21 NOTE — Progress Notes (Signed)
Patient ID: [name], female   DOB: 11/18/1970, 43 y.o.   MRN: [id number] Patient presents having noticed a bump on her vulva over the last several weeks. Had recent colposcopy and biopsy 02/28/2013 due to Pap smear showing LGSIL cannot rule out high-grade lesion. Biopsy showed LGSIL with ECC benign. Had noticed a small bump on her vulva that she did not bring to my attention at that time. Has a larger classic skin tag that's been present for years.  Exam with Maudie Mercury assistant External BUS vagina with #1 classic skin tag mid left lateral labia majora at crease with thigh. #2 small raised condylomatous appearing lesion left lower inner labia majora. Vulva otherwise normal as is vaginal exam. Physical Exam  Genitourinary:      Procedure: Both areas were cleansed with Betadine and infiltrated with 1% lidocaine. Both lesions were excised to the level of the surrounding skin and sent separately to pathology. Silver nitrate hemostasis achieved. Sterile dressing applied to #1 with postoperative care instructions given. Followup for biopsy results in several days.  Assessment and plan: 1. LGSIL cannot rule out high-grade lesion Pap smear. Colposcopy biopsy shows low-grade changes. Plan to repeat the Pap smear in 6 months reviewed with her the patient knows the importance of followup. 2. 2 skin lesions as noted above. Patient will followup for biopsy results in several days. Report any new lesions.

## 2013-03-21 NOTE — Patient Instructions (Signed)
Office will call you with the biopsy results 

## 2013-04-07 ENCOUNTER — Ambulatory Visit: Payer: 59 | Admitting: Gynecology

## 2013-04-07 ENCOUNTER — Ambulatory Visit: Payer: Self-pay

## 2013-04-08 ENCOUNTER — Ambulatory Visit
Admission: RE | Admit: 2013-04-08 | Discharge: 2013-04-08 | Disposition: A | Discharge status: Home / Self Care | Payer: 59 | Source: Ambulatory Visit

## 2013-04-08 DIAGNOSIS — Z1231 Encounter for screening mammogram for malignant neoplasm of breast: Secondary | ICD-10-CM

## 2013-04-15 ENCOUNTER — Ambulatory Visit (INDEPENDENT_AMBULATORY_CARE_PROVIDER_SITE_OTHER): Payer: 59 | Admitting: Gynecology

## 2013-04-15 ENCOUNTER — Encounter: Payer: Self-pay | Admitting: Gynecology

## 2013-04-15 DIAGNOSIS — N760 Acute vaginitis: Secondary | ICD-10-CM

## 2013-04-15 DIAGNOSIS — N763 Subacute and chronic vulvitis: Secondary | ICD-10-CM

## 2013-04-15 DIAGNOSIS — A630 Anogenital (venereal) warts: Secondary | ICD-10-CM

## 2013-04-15 MED ORDER — FLUCONAZOLE 150 MG PO TABS
150.0000 mg | ORAL_TABLET | Freq: Once | ORAL | Status: DC
Start: 2013-04-15 — End: 2013-06-14

## 2013-04-15 NOTE — Progress Notes (Addendum)
Patient ID: Leslie Ritter, female   DOB: [birth year], 43 y.o.   MRN: [id number] Leslie Ritter October 14, 1970 [id number]        43 y.o.  G1P1 presents complaining of 2 new bumps on her vulva over the past week or 2. Had not noticed previously. Patient recently had 2 lesions excised 03/21/2013 one of which was a mole in the other condyloma acuminata. Is also being followed for dysplasia with recent colposcopy and biopsy showing LGSIL with planned repeat Pap smear in 6 months.  Past medical history,surgical history, problem list, medications, allergies, family history and social history were all reviewed and documented in the EPIC chart.  Exam: Kim assistant General appearance  Normal External BUS vagina with 2 classic appearing condyloma right perianal region. No other lesions visualized. No vaginal lesions palpated. Cervix normal. Uterus normal size midline mobile nontender. Adnexa without masses or tenderness. Physical Exam  Genitourinary:        Assessment/Plan:  43 y.o. G1P1:  1. Condyloma acuminata as described above. Discussed options for management. Ultimately we decided on TCA 94% application. TCA was applied without difficulty and minimal discomfort to the patient. Post application instructions given. Patient will followup if these lesions persist otherwise she will return in 6 months for her Pap smear. 2. 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 Had used Diflucan prophylaxis in the past with good success. Stop doing so and now is having some recurrence before her menses each month. Diflucan #12 provided to use before each menses.    Note: This document was prepared with digital dictation and possible smart phrase technology. Any transcriptional errors that result from this process are unintentional.   [name], 11:06 AM 04/15/2013

## 2013-04-15 NOTE — Patient Instructions (Signed)
Followup if vulvar lesion does not resolve or if new ones develop. Otherwise followup in 6 months for repeat Pap smear as we discussed previously.

## 2013-05-06 ENCOUNTER — Encounter: Payer: Self-pay | Admitting: Gynecology

## 2013-05-06 ENCOUNTER — Ambulatory Visit (INDEPENDENT_AMBULATORY_CARE_PROVIDER_SITE_OTHER): Payer: 59 | Admitting: Gynecology

## 2013-05-06 DIAGNOSIS — A630 Anogenital (venereal) warts: Secondary | ICD-10-CM

## 2013-05-06 NOTE — Patient Instructions (Signed)
Genital Warts Genital warts are a sexually transmitted infection. They may appear as small bumps on the tissues of the genital area. CAUSES  Genital warts are caused by a virus called human papillomavirus (HPV). HPV is the most common sexually transmitted disease (STD) and infection of the sex organs. This infection is spread by having unprotected sex with an infected person. It can be spread by vaginal, anal, and oral sex. Many people do not know they are infected. They may be infected for years without problems. However, even if they do not have problems, they can unknowingly pass the infection to their sexual partners. SYMPTOMS   Itching and irritation in the genital area.  Warts that bleed.  Painful sexual intercourse. DIAGNOSIS  Warts are usually recognized with the naked eye on the vagina, vulva, perineum, anus, and rectum. Certain tests can also diagnose genital warts, such as:  A Pap test.  A tissue sample (biopsy) exam.  Colposcopy. A magnifying tool is used to examine the vagina and cervix. The HPV cells will change color when certain solutions are used. TREATMENT  Warts can be removed by:  Applying certain chemicals, such as cantharidin or podophyllin.  Liquid nitrogen freezing (cryotherapy).  Immunotherapy with candida or trichophyton injections.  Laser treatment.  Burning with an electrified probe (electrocautery).  Interferon injections.  Surgery. PREVENTION  HPV vaccination can help prevent HPV infections that cause genital warts and that cause cancer of the cervix. It is recommended that the vaccination be given to people between the ages 9 to 26 years old. The vaccine might not work as well or might not work at all if you already have HPV. It should not be given to pregnant women. HOME CARE INSTRUCTIONS   It is important to follow your caregiver's instructions. The warts will not go away without treatment. Repeat treatments are often needed to get rid of warts.  Even after it appears that the warts are gone, the normal tissue underneath often remains infected.  Do not try to treat genital warts with medicine used to treat hand warts. This type of medicine is strong and can burn the skin in the genital area, causing more damage.  Tell your past and current sexual partner(s) that you have genital warts. They may be infected also and need treatment.  Avoid sexual contact while being treated.  Do not touch or scratch the warts. The infection may spread to other parts of your body.  Women with genital warts should have a cervical cancer check (Pap test) at least once a year. This type of cancer is slow-growing and can be cured if found early. Chances of developing cervical cancer are increased with HPV.  Inform your obstetrician about your warts in the event of pregnancy. This virus can be passed to the baby's respiratory tract. Discuss this with your caregiver.  Use a condom during sexual intercourse. Following treatment, the use of condoms will help prevent reinfection.  Ask your caregiver about using over-the-counter anti-itch creams. SEEK MEDICAL CARE IF:   Your treated skin becomes red, swollen, or painful.  You have a fever.  You feel generally ill.  You feel little lumps in and around your genital area.  You are bleeding or have painful sexual intercourse. MAKE SURE YOU:   Understand these instructions.  Will watch your condition.  Will get help right away if you are not doing well or get worse. Document Released: 01/04/2000 Document Revised: 03/31/2011 Document Reviewed: 07/15/2010 Exit[employer] Patient Information 2014 [employer].  

## 2013-05-06 NOTE — Progress Notes (Signed)
Patient ID: Leslie Ritter, female   DOB: October 10, 1970, 43 y.o.   MRN: [id number] Leslie Ritter 01-25-1970 [id number]        43 y.o.  G1P1 presents with development of more genital warts. Was treated 04/15/2013 with TCA and these warts have resolved. She now has noticed several new early warts.  Past medical history,surgical history, problem list, medications, allergies, family history and social history were all reviewed and documented in the EPIC chart.  Directed ROS to system complaints/issues with pertinent positives and negatives documented in the history of present illness/assessment and plan.  Exam: Kim assistant General appearance  Normal Pelvic external BUS vagina with 4 small classic condyloma left lateral mid labia majora to the crease of the thigh. Physical Exam  Genitourinary:      Procedure: TCA 80% cautiously apply to each condyloma with minimal surrounding skin involvement.  Assessment/Plan:  43 y.o. G1P1 condyloma acuminata, treated with TCA. Followup if persists or new lesions develop. I again reviewed the whole issue of HPV and the natural course.   Note: This document was prepared with digital dictation and possible smart phrase technology. Any transcriptional errors that result from this process are unintentional.   [name], 12:22 PM 05/06/2013

## 2013-06-03 ENCOUNTER — Encounter: Payer: Self-pay | Admitting: Gynecology

## 2013-06-03 ENCOUNTER — Ambulatory Visit (INDEPENDENT_AMBULATORY_CARE_PROVIDER_SITE_OTHER): Payer: 59 | Admitting: Gynecology

## 2013-06-03 DIAGNOSIS — A630 Anogenital (venereal) warts: Secondary | ICD-10-CM

## 2013-06-03 NOTE — Progress Notes (Signed)
Patient ID: [name], female   DOB: 1970-12-29, 43 y.o.   MRN: [id number] Jenny Lai Flynt 07/26/1970 [id number]        43 y.o.  G1P1 presents with recurrence of her condyloma acuminata. Had good response to TCA application 08/17/209 with resolution of all treated areas but she has noticed several other areas have developed.  Past medical history,surgical history, problem list, medications, allergies, family history and social history were all reviewed and documented in the EPIC chart.  Directed ROS with pertinent positives and negatives documented in the history of present illness/assessment and plan.  Exam: Kim assistant General appearance  Normal External BUS vagina with several small condyloma as diagrammed below. Vagina without visual or palpable abnormalities. Cervix normal. Uterus normal size mobile nontender. Adnexa without masses or tenderness. Physical Exam  Genitourinary:      TCA 80% applied to all visible lesions taking care to only treat the lesions and not surrounding skin  Assessment/Plan:  43 y.o. 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 Followup if they persist, enlarged or other new lesions present. Patient is due for repeat Pap smear in August having had colposcopy and biopsy 02/2013 showing LGSIL, negative ECC.   Note: This document was prepared with digital dictation and possible smart phrase technology. Any transcriptional errors that result from this process are unintentional.   [name], 10:49 AM 06/03/2013

## 2013-06-03 NOTE — Patient Instructions (Signed)
Followup in August for Pap smear. Sooner if any recurrent vulvar lesions noted.

## 2013-06-14 ENCOUNTER — Ambulatory Visit (INDEPENDENT_AMBULATORY_CARE_PROVIDER_SITE_OTHER): Payer: 59 | Admitting: Nurse Practitioner

## 2013-06-14 ENCOUNTER — Encounter: Payer: Self-pay | Admitting: Nurse Practitioner

## 2013-06-14 VITALS — BP 102/69 | HR 73 | Temp 98.0°F | Ht 61.5 in | Wt 137.0 lb

## 2013-06-14 DIAGNOSIS — I10 Essential (primary) hypertension: Secondary | ICD-10-CM

## 2013-06-14 DIAGNOSIS — F419 Anxiety disorder, unspecified: Secondary | ICD-10-CM

## 2013-06-14 DIAGNOSIS — F411 Generalized anxiety disorder: Secondary | ICD-10-CM

## 2013-06-14 MED ORDER — LISINOPRIL-HYDROCHLOROTHIAZIDE 20-12.5 MG PO TABS: 1.0000 | ORAL_TABLET | Freq: Every day | ORAL | Status: DC

## 2013-06-14 NOTE — Progress Notes (Signed)
  Subjective:    Patient ID: Leslie Ritter, female    DOB: [birth year], 43 y.o.   MRN: [id number]  Patient here today for follow up of chronic medical problems.   Hypertension This is a chronic problem. The current episode started more than 1 year ago. The problem is unchanged. The problem is controlled. Pertinent negatives include no blurred vision, headaches, neck pain, orthopnea, palpitations, peripheral edema or shortness of breath. There are no associated agents to hypertension. Past treatments include ACE inhibitors and diuretics. The current treatment provides mild improvement. Compliance problems include diet.   Depression Lexapro working well- patient denies any side effects   Review of Systems  Eyes: Negative for blurred vision.  Respiratory: Negative for shortness of breath.   Cardiovascular: Negative for palpitations and orthopnea.  Musculoskeletal: Negative for neck pain.  Neurological: Negative for headaches.  All other systems reviewed and are negative.      Objective:   Physical Exam  Constitutional: She is oriented to person, place, and time. She appears well-developed and well-nourished.  HENT:  Nose: Nose normal.  Mouth/Throat: Oropharynx is clear and moist.  Eyes: EOM are normal.  Neck: Trachea normal, normal range of motion and full passive range of motion without pain. Neck supple. No JVD present. Carotid bruit is not present. No thyromegaly present.  Cardiovascular: Normal rate, regular rhythm, normal heart sounds and intact distal pulses.  Exam reveals no gallop and no friction rub.   No murmur heard. Pulmonary/Chest: Effort normal and breath sounds normal.  Abdominal: Soft. Bowel sounds are normal. She exhibits no distension and no mass. There is no tenderness.  Musculoskeletal: Normal range of motion.  Lymphadenopathy:    She has no cervical adenopathy.  Neurological: She is alert and oriented to person, place, and time. She has normal reflexes.  Skin:  Skin is warm and dry.  Psychiatric: She has a normal mood and affect. Her behavior is normal. Judgment and thought content normal.    BP 102/69  Pulse 73  Temp(Src) 98 F (36.7 C) (Oral)  Ht 5' 1.5" (1.562 m)  Wt 137 lb (62.143 kg)  BMI 25.47 kg/m2  LMP 05/13/2013       Assessment & Plan:   1. Anxiety   2. Hypertension   3. Anxiety state, unspecified    Orders Placed This Encounter  Procedures  . CMP14+EGFR  . NMR, lipoprofile   Meds ordered this encounter  Medications  . lisinopril-hydrochlorothiazide (PRINZIDE,ZESTORETIC) 20-12.5 MG per tablet    Sig: Take 1 tablet by mouth daily.    Dispense:  90 tablet    Refill:  1    Order Specific Question:  Supervising Provider    Answer:  Chipper Herb [[id number]]    Labs pending Health maintenance reviewed Diet and exercise encouraged Continue all meds Follow up  In 6 months    Ald[place], FNP

## 2013-06-14 NOTE — Patient Instructions (Signed)

## 2013-08-26 ENCOUNTER — Encounter: Payer: Self-pay | Admitting: Gynecology

## 2013-08-26 ENCOUNTER — Ambulatory Visit (INDEPENDENT_AMBULATORY_CARE_PROVIDER_SITE_OTHER): Payer: 59 | Admitting: Gynecology

## 2013-08-26 ENCOUNTER — Other Ambulatory Visit (HOSPITAL_COMMUNITY)
Admission: RE | Admit: 2013-08-26 | Discharge: 2013-08-26 | Disposition: A | Discharge status: Home / Self Care | Payer: 59 | Source: Ambulatory Visit | Attending: Gynecology | Admitting: Gynecology

## 2013-08-26 DIAGNOSIS — Z1151 Encounter for screening for human papillomavirus (HPV): Secondary | ICD-10-CM | POA: Diagnosis present

## 2013-08-26 DIAGNOSIS — R87810 Cervical high risk human papillomavirus (HPV) DNA test positive: Secondary | ICD-10-CM | POA: Insufficient documentation

## 2013-08-26 DIAGNOSIS — IMO0002 Reserved for concepts with insufficient information to code with codable children: Secondary | ICD-10-CM

## 2013-08-26 DIAGNOSIS — R6889 Other general symptoms and signs: Secondary | ICD-10-CM

## 2013-08-26 DIAGNOSIS — Z124 Encounter for screening for malignant neoplasm of cervix: Secondary | ICD-10-CM | POA: Insufficient documentation

## 2013-08-26 NOTE — Addendum Note (Signed)
Addended by: Nelva Nay on: 08/26/2013 12:08 PM   Modules accepted: Orders

## 2013-08-26 NOTE — Progress Notes (Signed)
Leslie Ritter 1970-12-07 [id number]        43 y.o.  G1P1 presents for followup Pap smear. Patient has history of HGSIL 2008 with positive high-risk HPV. Subsequent LGSIL 2009 and most recently LGSIL 01/2013 cannot rule out high-grade lesion. Colposcopy with biopsy showed LGSIL with negative ECC. She presents now for a six-month followup Pap smear.  Patient also has a history of recurrent condyloma acuminata externally and notes no recent lesions on self-exam  Past medical history,surgical history, problem list, medications, allergies, family history and social history were all reviewed and documented in the EPIC chart.  Directed ROS with pertinent positives and negatives documented in the history of present illness/assessment and plan.  Exam: Kim assistant General appearance:  Normal Pelvic external BUS vagina with no evidence of condyloma acuminata. Cervix grossly normal. Pap/HPV. Uterus normal size midline mobile nontender. Adnexa without masses or tenderness.  Assessment/Plan:  43 y.o. G1P1 with history noted above. Follow up for Pap smear results. If normal or low-grade plan repeat beginning of next year for annual exam. If high grade then colposcopy.   Note: This document was prepared with digital dictation and possible smart phrase technology. Any transcriptional errors that result from this process are unintentional.   [name], 11:59 AM 08/26/2013

## 2013-08-26 NOTE — Patient Instructions (Signed)
Followup for Pap smear result. Assuming normal or mild atypia then plan repeat beginning of next year for your annual exam. Otherwise then we'll discuss this over the phone.

## 2013-08-29 LAB — CYTOLOGY - PAP

## 2013-08-31 ENCOUNTER — Encounter: Payer: Self-pay | Admitting: Gynecology

## 2013-11-21 ENCOUNTER — Encounter: Payer: Self-pay | Admitting: Gynecology

## 2014-01-06 ENCOUNTER — Ambulatory Visit (INDEPENDENT_AMBULATORY_CARE_PROVIDER_SITE_OTHER): Payer: 59 | Admitting: Nurse Practitioner

## 2014-01-06 ENCOUNTER — Encounter: Payer: Self-pay | Admitting: Nurse Practitioner

## 2014-01-06 VITALS — BP 119/76 | HR 76 | Temp 97.2°F | Ht 61.5 in | Wt 143.2 lb

## 2014-01-06 DIAGNOSIS — I10 Essential (primary) hypertension: Secondary | ICD-10-CM

## 2014-01-06 DIAGNOSIS — F411 Generalized anxiety disorder: Secondary | ICD-10-CM

## 2014-01-06 MED ORDER — LISINOPRIL-HYDROCHLOROTHIAZIDE 20-12.5 MG PO TABS: 1.0000 | ORAL_TABLET | Freq: Every day | ORAL | Status: DC

## 2014-01-06 MED ORDER — ESCITALOPRAM OXALATE 20 MG PO TABS: ORAL_TABLET | ORAL | Status: DC

## 2014-01-06 NOTE — Progress Notes (Signed)
  Subjective:    Patient ID: Leslie Ritter, female    DOB: July 03, 1970, 43 y.o.   MRN: [id number]  Hypertension This is a chronic problem. The current episode started more than 1 year ago. The problem is unchanged. The problem is controlled. Pertinent negatives include no headaches, neck pain, palpitations or shortness of breath. Risk factors for coronary artery disease include dyslipidemia. Past treatments include ACE inhibitors and diuretics. The current treatment provides moderate improvement. Compliance problems include diet and exercise.   Depression Lexapro working well- patient denies any side effects   Review of Systems  Respiratory: Negative for shortness of breath.   Cardiovascular: Negative for palpitations.  Musculoskeletal: Negative for neck pain.  Neurological: Negative for headaches.  All other systems reviewed and are negative.      Objective:   Physical Exam  Constitutional: She is oriented to person, place, and time. She appears well-developed and well-nourished.  HENT:  Nose: Nose normal.  Mouth/Throat: Oropharynx is clear and moist.  Eyes: EOM are normal.  Neck: Trachea normal, normal range of motion and full passive range of motion without pain. Neck supple. No JVD present. Carotid bruit is not present. No thyromegaly present.  Cardiovascular: Normal rate, regular rhythm, normal heart sounds and intact distal pulses.  Exam reveals no gallop and no friction rub.   No murmur heard. Pulmonary/Chest: Effort normal and breath sounds normal.  Abdominal: Soft. Bowel sounds are normal. She exhibits no distension and no mass. There is no tenderness.  Musculoskeletal: Normal range of motion.  Lymphadenopathy:    She has no cervical adenopathy.  Neurological: She is alert and oriented to person, place, and time. She has normal reflexes.  Skin: Skin is warm and dry.  Psychiatric: She has a normal mood and affect. Her behavior is normal. Judgment and thought content normal.     BP 119/76 mmHg  Pulse 76  Temp(Src) 97.2 F (36.2 C) (Oral)  Ht 5' 1.5" (1.562 m)  Wt 143 lb 4 oz (64.978 kg)  BMI 26.63 kg/m2  LMP 12/23/2013 (Approximate)       Assessment & Plan:    1. Essential hypertension Do not add salt to diet - lisinopril-hydrochlorothiazide (PRINZIDE,ZESTORETIC) 20-12.5 MG per tablet; Take 1 tablet by mouth daily.  Dispense: 90 tablet; Refill: 1 - CMP14+EGFR - NMR, lipoprofile  2. Anxiety state Stress management - escitalopram (LEXAPRO) 20 MG tablet; Take one tablet daily  Dispense: 90 tablet; Refill: 4    Labs pending Health maintenance reviewed Diet and exercise encouraged Continue all meds Follow up  In 6 months   [place], FNP

## 2014-01-06 NOTE — Patient Instructions (Signed)

## 2014-01-07 LAB — CMP14+EGFR
A/G RATIO: 2.2 (ref 1.1–2.5)
ALT: 14 IU/L (ref 0–32)
AST: 13 IU/L (ref 0–40)
Albumin: 4.8 g/dL (ref 3.5–5.5)
Alkaline Phosphatase: 52 IU/L (ref 39–117)
BUN/Creatinine Ratio: 13 (ref 9–23)
BUN: 9 mg/dL (ref 6–24)
CALCIUM: 10 mg/dL (ref 8.7–10.2)
CO2: 28 mmol/L (ref 18–29)
Chloride: 98 mmol/L (ref 97–108)
Creatinine, Ser: 0.67 mg/dL (ref 0.57–1.00)
GFR calc Af Amer: 124 mL/min/{1.73_m2} (ref >59)
GFR, EST NON AFRICAN AMERICAN: 107 mL/min/{1.73_m2} (ref >59)
Globulin, Total: 2.2 g/dL (ref 1.5–4.5)
Glucose: 128 mg/dL — ABNORMAL HIGH (ref 65–99)
Potassium: 4.3 mmol/L (ref 3.5–5.2)
SODIUM: 139 mmol/L (ref 134–144)
Total Bilirubin: 0.7 mg/dL (ref 0.0–1.2)
Total Protein: 7 g/dL (ref 6.0–8.5)

## 2014-01-07 LAB — NMR, LIPOPROFILE
Cholesterol: 165 mg/dL (ref 100–199)
HDL CHOLESTEROL BY NMR: 96 mg/dL (ref >39)
HDL Particle Number: 36.8 umol/L (ref >=30.5)
LDL Particle Number: 530 nmol/L (ref <1000)
LDL SIZE: 21.2 nm (ref >20.5)
LDL-C: 57 mg/dL (ref 0–99)
LP-IR Score: 34 (ref <=45)
Triglycerides by NMR: 62 mg/dL (ref 0–149)

## 2014-01-17 ENCOUNTER — Telehealth: Payer: Self-pay | Admitting: Nurse Practitioner

## 2014-01-17 NOTE — Telephone Encounter (Signed)
Aware of results. 

## 2014-02-10 ENCOUNTER — Encounter: Payer: 59 | Admitting: Women's Health

## 2014-02-16 ENCOUNTER — Encounter: Payer: Self-pay | Admitting: Women's Health

## 2014-03-02 ENCOUNTER — Encounter: Payer: Self-pay | Admitting: Women's Health

## 2014-03-02 ENCOUNTER — Ambulatory Visit (INDEPENDENT_AMBULATORY_CARE_PROVIDER_SITE_OTHER): Payer: 59 | Admitting: Women's Health

## 2014-03-02 ENCOUNTER — Other Ambulatory Visit (HOSPITAL_COMMUNITY)
Admission: RE | Admit: 2014-03-02 | Discharge: 2014-03-02 | Disposition: A | Discharge status: Home / Self Care | Payer: 59 | Source: Ambulatory Visit | Attending: Gynecology | Admitting: Gynecology

## 2014-03-02 VITALS — BP 120/78 | Ht 61.0 in | Wt 142.0 lb

## 2014-03-02 DIAGNOSIS — F411 Generalized anxiety disorder: Secondary | ICD-10-CM

## 2014-03-02 DIAGNOSIS — Z01411 Encounter for gynecological examination (general) (routine) with abnormal findings: Secondary | ICD-10-CM | POA: Insufficient documentation

## 2014-03-02 DIAGNOSIS — B373 Candidiasis of vulva and vagina: Secondary | ICD-10-CM

## 2014-03-02 DIAGNOSIS — B3731 Acute candidiasis of vulva and vagina: Secondary | ICD-10-CM

## 2014-03-02 DIAGNOSIS — Z01419 Encounter for gynecological examination (general) (routine) without abnormal findings: Secondary | ICD-10-CM

## 2014-03-02 DIAGNOSIS — B009 Herpesviral infection, unspecified: Secondary | ICD-10-CM

## 2014-03-02 MED ORDER — ESCITALOPRAM OXALATE 20 MG PO TABS: ORAL_TABLET | ORAL | Status: DC

## 2014-03-02 MED ORDER — VALACYCLOVIR HCL 500 MG PO TABS: 500.0000 mg | ORAL_TABLET | Freq: Two times a day (BID) | ORAL | Status: DC

## 2014-03-02 MED ORDER — ALPRAZOLAM 0.25 MG PO TABS: 0.2500 mg | ORAL_TABLET | Freq: Every evening | ORAL | Status: DC | PRN

## 2014-03-02 MED ORDER — FLUCONAZOLE 100 MG PO TABS: 100.0000 mg | ORAL_TABLET | ORAL | Status: DC

## 2014-03-02 NOTE — Patient Instructions (Signed)

## 2014-03-02 NOTE — Progress Notes (Signed)
Leslie Ritter 03/17/1970 [id number]    History:    Presents for annual exam.  Monthly cycle/BTL. 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 Same partner, engaged. Normal mammogram history. Primary care manages hypertension. Anxiety and depression stable on Lexapro, occasional Xanax uses sparingly. HSV 1 rare outbreaks.  Past medical history, past surgical history, family history and social history were all reviewed and documented in the EPIC chart. 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 Mother sister hypertension.  ROS:  A ROS was performed and pertinent positives and negatives are included.  Exam:  Filed Vitals:   03/02/14 1447  BP: 120/78    General appearance:  Normal Thyroid:  Symmetrical, normal in size, without palpable masses or nodularity. Respiratory  Auscultation:  Clear without wheezing or rhonchi Cardiovascular  Auscultation:  Regular rate, without rubs, murmurs or gallops  Edema/varicosities:  Not grossly evident Abdominal  Soft,nontender, without masses, guarding or rebound.  Liver/spleen:  No organomegaly noted  Hernia:  None appreciated  Skin  Inspection:  Grossly normal   Breasts: Examined lying and sitting.     Right: Without masses, retractions, discharge or axillary adenopathy.     Left: Without masses, retractions, discharge or axillary adenopathy. Gentitourinary   Inguinal/mons:  Normal without inguinal adenopathy  External genitalia:  Normal  BUS/Urethra/Skene's glands:  Normal  Vagina:  Normal  Cervix:  Normal  Uterus:   normal in size, shape and contour.  Midline and mobile  Adnexa/parametria:     Rt: Without masses or tenderness.   Lt: Without masses or tenderness.  Anus and perineum: Normal  Digital rectal exam: Normal sphincter tone without palpated masses or tenderness  Assessment/Plan:  44 y.o. DWF G1P1 for annual exam.     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 Reviewed importance of regular exercise, leisure activities counseling as needed. Uses Xanax sparingly, aware to use with caution. Prescription, proper use given and reviewed. Valtrex 500 twice daily for 3-5 days as needed prescription, proper use given and reviewed. SBE's, annual mammogram, calcium rich diet, regular exercise and vitamin D 1000 daily encouraged. UA, Pap. Reviewed will check high-risk HPV typing with next Pap. Reviewed will continue annual screening until at least 5 normal.   [place], 5:03 PM 03/02/2014

## 2014-03-03 LAB — URINALYSIS W MICROSCOPIC + REFLEX CULTURE
Bacteria, UA: NONE SEEN
Bilirubin Urine: NEGATIVE
CASTS: NONE SEEN
CRYSTALS: NONE SEEN
GLUCOSE, UA: NEGATIVE mg/dL
Hgb urine dipstick: NEGATIVE
Ketones, ur: NEGATIVE mg/dL
LEUKOCYTES UA: NEGATIVE
Nitrite: NEGATIVE
PH: 7 (ref 5.0–8.0)
Protein, ur: NEGATIVE mg/dL
SQUAMOUS EPITHELIAL / LPF: NONE SEEN
Specific Gravity, Urine: 1.018 (ref 1.005–1.030)
Urobilinogen, UA: 0.2 mg/dL (ref 0.0–1.0)

## 2014-03-06 LAB — CYTOLOGY - PAP

## 2014-03-16 ENCOUNTER — Other Ambulatory Visit: Payer: Self-pay

## 2014-03-16 DIAGNOSIS — Z1231 Encounter for screening mammogram for malignant neoplasm of breast: Secondary | ICD-10-CM

## 2014-04-13 ENCOUNTER — Ambulatory Visit
Admission: RE | Admit: 2014-04-13 | Discharge: 2014-04-13 | Disposition: A | Discharge status: Home / Self Care | Payer: 59 | Source: Ambulatory Visit

## 2014-04-13 DIAGNOSIS — Z1231 Encounter for screening mammogram for malignant neoplasm of breast: Secondary | ICD-10-CM

## 2014-04-17 ENCOUNTER — Encounter: Payer: Self-pay | Admitting: Women's Health

## 2014-06-09 ENCOUNTER — Encounter: Payer: Self-pay | Admitting: Nurse Practitioner

## 2014-06-09 ENCOUNTER — Ambulatory Visit (INDEPENDENT_AMBULATORY_CARE_PROVIDER_SITE_OTHER): Payer: 59 | Admitting: Nurse Practitioner

## 2014-06-09 VITALS — BP 104/72 | HR 66 | Temp 97.6°F | Ht 61.0 in | Wt 135.0 lb

## 2014-06-09 DIAGNOSIS — I10 Essential (primary) hypertension: Secondary | ICD-10-CM | POA: Diagnosis not present

## 2014-06-09 DIAGNOSIS — F411 Generalized anxiety disorder: Secondary | ICD-10-CM | POA: Diagnosis not present

## 2014-06-09 MED ORDER — LISINOPRIL-HYDROCHLOROTHIAZIDE 20-12.5 MG PO TABS: 1.0000 | ORAL_TABLET | Freq: Every day | ORAL | Status: DC

## 2014-06-09 MED ORDER — ALPRAZOLAM 0.25 MG PO TABS: 0.2500 mg | ORAL_TABLET | Freq: Every evening | ORAL | Status: DC | PRN

## 2014-06-09 MED ORDER — VILAZODONE HCL 10 & 20 MG PO KIT: 1.0000 | PACK | Freq: Every day | ORAL | Status: DC

## 2014-06-09 NOTE — Patient Instructions (Signed)
Stress and Stress Management Stress is a normal reaction to life events. It is what you feel when life demands more than you are used to or more than you can handle. Some stress can be useful. For example, the stress reaction can help you catch the last bus of the day, study for a test, or meet a deadline at work. But stress that occurs too often or for too long can cause problems. It can affect your emotional health and interfere with relationships and normal daily activities. Too much stress can weaken your immune system and increase your risk for physical illness. If you already have a medical problem, stress can make it worse. CAUSES  All sorts of life events may cause stress. An event that causes stress for one person may not be stressful for another person. Major life events commonly cause stress. These may be positive or negative. Examples include losing your job, moving into a new home, getting married, having a baby, or losing a loved one. Less obvious life events may also cause stress, especially if they occur day after day or in combination. Examples include working long hours, driving in traffic, caring for children, being in debt, or being in a difficult relationship. SIGNS AND SYMPTOMS Stress may cause emotional symptoms including, the following:  Anxiety. This is feeling worried, afraid, on edge, overwhelmed, or out of control.  Anger. This is feeling irritated or impatient.  Depression. This is feeling sad, down, helpless, or guilty.  Difficulty focusing, remembering, or making decisions. Stress may cause physical symptoms, including the following:   Aches and pains. These may affect your head, neck, back, stomach, or other areas of your body.  Tight muscles or clenched jaw.  Low energy or trouble sleeping. Stress may cause unhealthy behaviors, including the following:   Eating to feel better (overeating) or skipping meals.  Sleeping too little, too much, or both.  Working  too much or putting off tasks (procrastination).  Smoking, drinking alcohol, or using drugs to feel better. DIAGNOSIS  Stress is diagnosed through an assessment by your health care provider. Your health care provider will ask questions about your symptoms and any stressful life events.Your health care provider will also ask about your medical history and may order blood tests or other tests. Certain medical conditions and medicine can cause physical symptoms similar to stress. Mental illness can cause emotional symptoms and unhealthy behaviors similar to stress. Your health care provider may refer you to a mental health professional for further evaluation.  TREATMENT  Stress management is the recommended treatment for stress.The goals of stress management are reducing stressful life events and coping with stress in healthy ways.  Techniques for reducing stressful life events include the following:  Stress identification. Self-monitor for stress and identify what causes stress for you. These skills may help you to avoid some stressful events.  Time management. Set your priorities, keep a calendar of events, and learn to say "no." These tools can help you avoid making too many commitments. Techniques for coping with stress include the following:  Rethinking the problem. Try to think realistically about stressful events rather than ignoring them or overreacting. Try to find the positives in a stressful situation rather than focusing on the negatives.  Exercise. Physical exercise can release both physical and emotional tension. The key is to find a form of exercise you enjoy and do it regularly.  Relaxation techniques. These relax the body and mind. Examples include yoga, meditation, tai chi, biofeedback, deep  breathing, progressive muscle relaxation, listening to music, being out in nature, journaling, and other hobbies. Again, the key is to find one or more that you enjoy and can do  regularly.  Healthy lifestyle. Eat a balanced diet, get plenty of sleep, and do not smoke. Avoid using alcohol or drugs to relax.  Strong support network. Spend time with family, friends, or other people you enjoy being around.Express your feelings and talk things over with someone you trust. Counseling or talktherapy with a mental health professional may be helpful if you are having difficulty managing stress on your own. Medicine is typically not recommended for the treatment of stress.Talk to your health care provider if you think you need medicine for symptoms of stress. HOME CARE INSTRUCTIONS  Keep all follow-up visits as directed by your health care provider.  Take all medicines as directed by your health care provider. SEEK MEDICAL CARE IF:  Your symptoms get worse or you start having new symptoms.  You feel overwhelmed by your problems and can no longer manage them on your own. SEEK IMMEDIATE MEDICAL CARE IF:  You feel like hurting yourself or someone else. Document Released: 07/02/2000 Document Revised: 05/23/2013 Document Reviewed: 08/31/2012 ExitCare Patient Information 2015 [employer]. This information is not intended to replace advice given to you by your health care provider. Make sure you discuss any questions you have with your health care provider.  

## 2014-06-09 NOTE — Progress Notes (Signed)
  Subjective:    Patient ID: Leslie Ritter, female    DOB: 1970/08/29, 44 y.o.   MRN: [id number]   Patient here today for follow up of chronic medical problems.   Hypertension This is a chronic problem. The current episode started more than 1 year ago. The problem is unchanged. The problem is controlled. Pertinent negatives include no headaches, neck pain, palpitations or shortness of breath. Risk factors for coronary artery disease include dyslipidemia. Past treatments include ACE inhibitors and diuretics. The current treatment provides moderate improvement. Compliance problems include diet and exercise.   Depression Lexapro not working as well as it did- feels anxious a lot and worse when she is on her menses. Also takes xanax as  needed   Review of Systems  Respiratory: Negative for shortness of breath.   Cardiovascular: Negative for palpitations.  Musculoskeletal: Negative for neck pain.  Neurological: Negative for headaches.  All other systems reviewed and are negative.      Objective:   Physical Exam  Constitutional: She is oriented to person, place, and time. She appears well-developed and well-nourished.  HENT:  Nose: Nose normal.  Mouth/Throat: Oropharynx is clear and moist.  Eyes: EOM are normal.  Neck: Trachea normal, normal range of motion and full passive range of motion without pain. Neck supple. No JVD present. Carotid bruit is not present. No thyromegaly present.  Cardiovascular: Normal rate, regular rhythm, normal heart sounds and intact distal pulses.  Exam reveals no gallop and no friction rub.   No murmur heard. Pulmonary/Chest: Effort normal and breath sounds normal.  Abdominal: Soft. Bowel sounds are normal. She exhibits no distension and no mass. There is no tenderness.  Musculoskeletal: Normal range of motion.  Lymphadenopathy:    She has no cervical adenopathy.  Neurological: She is alert and oriented to person, place, and time. She has normal reflexes.   Skin: Skin is warm and dry.  Psychiatric: She has a normal mood and affect. Her behavior is normal. Judgment and thought content normal.    BP 104/72 mmHg  Pulse 66  Temp(Src) 97.6 F (36.4 C) (Oral)  Ht $R'5\' 1"'SB$  (1.549 m)  Wt 135 lb (61.236 kg)  BMI 25.52 kg/m2       Assessment & Plan:    1. Essential hypertension Do not add salt to diet - lisinopril-hydrochlorothiazide (PRINZIDE,ZESTORETIC) 20-12.5 MG per tablet; Take 1 tablet by mouth daily.  Dispense: 90 tablet; Refill: 1 - CMP14+EGFR - NMR, lipoprofile  2. Anxiety state Hold lexapro and try viibryd- let me know if helps - ALPRAZolam (XANAX) 0.25 MG tablet; Take 1 tablet (0.25 mg total) by mouth at bedtime as needed for sleep.  Dispense: 30 tablet; Refill: 1 - Vilazodone HCl (VIIBRYD STARTER PACK) 10 & 20 MG KIT; Take 1 tablet by mouth daily.  Dispense: 2 kit; Refill: 0    Labs pending Health maintenance reviewed Diet and exercise encouraged Continue all meds Follow up  In 6 month   [place], FNP

## 2014-06-10 LAB — NMR, LIPOPROFILE
Cholesterol: 156 mg/dL (ref 100–199)
HDL Cholesterol by NMR: 96 mg/dL (ref >39)
HDL Particle Number: 39.2 umol/L (ref >=30.5)
LDL Particle Number: 374 nmol/L (ref <1000)
LDL SIZE: 21.7 nm (ref >20.5)
LDL-C: 48 mg/dL (ref 0–99)
LP-IR Score: 43 (ref <=45)
Small LDL Particle Number: <90 nmol/L (ref <=527)
Triglycerides by NMR: 60 mg/dL (ref 0–149)

## 2014-06-10 LAB — CMP14+EGFR
ALT: 11 IU/L (ref 0–32)
AST: 12 IU/L (ref 0–40)
Albumin/Globulin Ratio: 1.6 (ref 1.1–2.5)
Albumin: 4.5 g/dL (ref 3.5–5.5)
Alkaline Phosphatase: 51 IU/L (ref 39–117)
BILIRUBIN TOTAL: 0.6 mg/dL (ref 0.0–1.2)
BUN / CREAT RATIO: 21 (ref 9–23)
BUN: 15 mg/dL (ref 6–24)
CO2: 26 mmol/L (ref 18–29)
CREATININE: 0.73 mg/dL (ref 0.57–1.00)
Calcium: 9.7 mg/dL (ref 8.7–10.2)
Chloride: 99 mmol/L (ref 97–108)
GFR calc Af Amer: 116 mL/min/{1.73_m2} (ref >59)
GFR calc non Af Amer: 100 mL/min/{1.73_m2} (ref >59)
Globulin, Total: 2.8 g/dL (ref 1.5–4.5)
Glucose: 90 mg/dL (ref 65–99)
Potassium: 4.3 mmol/L (ref 3.5–5.2)
Sodium: 140 mmol/L (ref 134–144)
Total Protein: 7.3 g/dL (ref 6.0–8.5)

## 2014-06-14 ENCOUNTER — Telehealth: Payer: Self-pay | Admitting: Nurse Practitioner

## 2014-06-14 NOTE — Telephone Encounter (Signed)
i don't really think buspar works that well because it is only for anxiety like xanax or valium. Either need to go back on lexapro or change meds- should wean off lexapro and not stop it cold [place], that might be why you feel bad as well.

## 2014-06-15 NOTE — Telephone Encounter (Signed)
Patient is going to try to see how she does with the increase to 20mg  and see how she does with that.

## 2014-08-01 ENCOUNTER — Encounter: Payer: Self-pay | Admitting: Family

## 2014-08-01 ENCOUNTER — Ambulatory Visit (INDEPENDENT_AMBULATORY_CARE_PROVIDER_SITE_OTHER): Payer: 59 | Admitting: Family

## 2014-08-01 ENCOUNTER — Ambulatory Visit (INDEPENDENT_AMBULATORY_CARE_PROVIDER_SITE_OTHER): Payer: 59

## 2014-08-01 VITALS — BP 126/87 | HR 71 | Temp 98.2°F | Ht 61.0 in | Wt 135.4 lb

## 2014-08-01 DIAGNOSIS — R05 Cough: Secondary | ICD-10-CM

## 2014-08-01 DIAGNOSIS — F419 Anxiety disorder, unspecified: Secondary | ICD-10-CM | POA: Diagnosis not present

## 2014-08-01 DIAGNOSIS — R079 Chest pain, unspecified: Secondary | ICD-10-CM

## 2014-08-01 DIAGNOSIS — R059 Cough, unspecified: Secondary | ICD-10-CM

## 2014-08-01 MED ORDER — DULOXETINE HCL 30 MG PO CPEP: 30.0000 mg | ORAL_CAPSULE | Freq: Every day | ORAL | Status: DC

## 2014-08-01 NOTE — Progress Notes (Signed)
Subjective:    Patient ID: Leslie Ritter, female    DOB: 1970-05-02, 44 y.o.   MRN: [id number]  Chest Pain  The current episode started 1 to 4 weeks ago. The onset quality is gradual. The problem occurs intermittently. The problem has been unchanged. The pain is present in the substernal region. The pain is at a severity of 5/10. The pain is mild. The quality of the pain is described as heavy. The pain does not radiate. Associated symptoms include palpitations. Pertinent negatives include no abdominal pain, back pain, diaphoresis, dizziness, headaches, malaise/fatigue, nausea, near-syncope, shortness of breath, syncope, vomiting or weakness. Associated with: pt has woke up with this pain 3 different times. She has tried nothing for the symptoms. The treatment provided no relief. Risk factors include stress.  Her past medical history is significant for anxiety/panic attacks.   *Pt states she feels "stressed" at work and different family issues. PT states her work is "short handed" and this seems to be causing her a lot of "worry and feeling up tight". Pt is currently on lexapro 20 mg daily. Pt states she has xanax that she takes "maybe a few times a month".    Review of Systems  Constitutional: Negative.  Negative for malaise/fatigue and diaphoresis.  HENT: Negative.   Eyes: Negative.   Respiratory: Negative.  Negative for shortness of breath.   Cardiovascular: Positive for chest pain and palpitations. Negative for syncope and near-syncope.  Gastrointestinal: Negative.  Negative for nausea, vomiting and abdominal pain.  Endocrine: Negative.   Genitourinary: Negative.   Musculoskeletal: Negative.  Negative for back pain.  Neurological: Negative.  Negative for dizziness, weakness and headaches.  Hematological: Negative.   Psychiatric/Behavioral: Negative.   All other systems reviewed and are negative.      Objective:   Physical Exam  Constitutional: She is oriented to person, place, and  time. She appears well-developed and well-nourished. No distress.  HENT:  Head: Normocephalic and atraumatic.  Right Ear: External ear normal.  Left Ear: External ear normal.  Nose: Nose normal.  Mouth/Throat: Oropharynx is clear and moist.  Eyes: Pupils are equal, round, and reactive to light.  Neck: Normal range of motion. Neck supple. No thyromegaly present.  Cardiovascular: Normal rate, regular rhythm, normal heart sounds and intact distal pulses.   No murmur heard. Pulmonary/Chest: Effort normal and breath sounds normal. No respiratory distress. She has no wheezes.  Abdominal: Soft. Bowel sounds are normal. She exhibits no distension. There is no tenderness.  Musculoskeletal: Normal range of motion. She exhibits no edema or tenderness.  Neurological: She is alert and oriented to person, place, and time. She has normal reflexes. No cranial nerve deficit.  Skin: Skin is warm and dry.  Psychiatric: She has a normal mood and affect. Her behavior is normal. Judgment and thought content normal.  Vitals reviewed.  Chest x-ray- WNL Preliminary reading by Evelina Dun, FNP [facility]    BP 126/87 mmHg  Pulse 71  Temp(Src) 98.2 F (36.8 C) (Oral)  Ht _0  (1.549 m)  Wt 135 lb 6.4 oz (61.417 kg)  BMI 25.60 kg/m2  SpO2 99%     Assessment & Plan:  1. Cough - DG Chest 2 View; Future  2. Chest pain, unspecified chest pain type - EKG 12-Lead - CMP14+EGFR - Thyroid Panel With TSH  3. Anxiety -Stress management discussed -RTO in 1 month as needed - CMP14+EGFR - Thyroid Panel With TSH - DULoxetine (CYMBALTA) 30 MG capsule; Take 1 capsule (30 mg  total) by mouth daily.  Dispense: 90 capsule; Refill: [place], FNP

## 2014-08-01 NOTE — Patient Instructions (Signed)
Generalized Anxiety Disorder Generalized anxiety disorder (GAD) is a mental disorder. It interferes with life functions, including relationships, work, and school. GAD is different from normal anxiety, which everyone experiences at some point in their lives in response to specific life events and activities. Normal anxiety actually helps us prepare for and get through these life events and activities. Normal anxiety goes away after the event or activity is over.  GAD causes anxiety that is not necessarily related to specific events or activities. It also causes excess anxiety in proportion to specific events or activities. The anxiety associated with GAD is also difficult to control. GAD can vary from mild to severe. People with severe GAD can have intense waves of anxiety with physical symptoms (panic attacks).  SYMPTOMS The anxiety and worry associated with GAD are difficult to control. This anxiety and worry are related to many life events and activities and also occur more days than not for 6 months or longer. People with GAD also have three or more of the following symptoms (one or more in children):  Restlessness.   Fatigue.  Difficulty concentrating.   Irritability.  Muscle tension.  Difficulty sleeping or unsatisfying sleep. DIAGNOSIS GAD is diagnosed through an assessment by your health care provider. Your health care provider will ask you questions aboutyour mood,physical symptoms, and events in your life. Your health care provider may ask you about your medical history and use of alcohol or drugs, including prescription medicines. Your health care provider may also do a physical exam and blood tests. Certain medical conditions and the use of certain substances can cause symptoms similar to those associated with GAD. Your health care provider may refer you to a mental health specialist for further evaluation. TREATMENT The following therapies are usually used to treat GAD:    Medication. Antidepressant medication usually is prescribed for long-term daily control. Antianxiety medicines may be added in severe cases, especially when panic attacks occur.   Talk therapy (psychotherapy). Certain types of talk therapy can be helpful in treating GAD by providing support, education, and guidance. A form of talk therapy called cognitive behavioral therapy can teach you healthy ways to think about and react to daily life events and activities.  Stress managementtechniques. These include yoga, meditation, and exercise and can be very helpful when they are practiced regularly. A mental health specialist can help determine which treatment is best for you. Some people see improvement with one therapy. However, other people require a combination of therapies. Document Released: 05/03/2012 Document Revised: 05/23/2013 Document Reviewed: 05/03/2012 Exit[employer] Patient Information 2015 [employer]. This information is not intended to replace advice given to you by your health care provider. Make sure you discuss any questions you have with your health care provider.  

## 2014-08-03 LAB — CMP14+EGFR
ALBUMIN: 4.7 g/dL (ref 3.5–5.5)
ALT: 13 IU/L (ref 0–32)
AST: 12 IU/L (ref 0–40)
Albumin/Globulin Ratio: 1.7 (ref 1.1–2.5)
Alkaline Phosphatase: 56 IU/L (ref 39–117)
BILIRUBIN TOTAL: 0.4 mg/dL (ref 0.0–1.2)
BUN/Creatinine Ratio: 21 (ref 9–23)
BUN: 16 mg/dL (ref 6–24)
CO2: 25 mmol/L (ref 18–29)
Calcium: 9.6 mg/dL (ref 8.7–10.2)
Chloride: 97 mmol/L (ref 97–108)
Creatinine, Ser: 0.77 mg/dL (ref 0.57–1.00)
GFR calc Af Amer: 109 mL/min/{1.73_m2} (ref >59)
GFR, EST NON AFRICAN AMERICAN: 94 mL/min/{1.73_m2} (ref >59)
Globulin, Total: 2.7 g/dL (ref 1.5–4.5)
Glucose: 96 mg/dL (ref 65–99)
Potassium: 4.6 mmol/L (ref 3.5–5.2)
Sodium: 140 mmol/L (ref 134–144)
Total Protein: 7.4 g/dL (ref 6.0–8.5)

## 2014-08-03 LAB — THYROID PANEL WITH TSH
Free Thyroxine Index: 1.8 (ref 1.2–4.9)
T3 UPTAKE RATIO: 32 % (ref 24–39)
T4 TOTAL: 5.7 ug/dL (ref 4.5–12.0)
TSH: 1.4 u[IU]/mL (ref 0.450–4.500)

## 2014-10-17 ENCOUNTER — Telehealth: Payer: Self-pay

## 2014-10-17 NOTE — Telephone Encounter (Signed)
Patient said fiancee has had a couple of condyloma and went to his MD and they "froze them off".  The last time he went she said the MD told him that they were going to keep coming back and gave him cream to use at home for 5 mos.  She is concerned why they would not just go ahead and freeze it off. She said that is what her provider here did for her.  I told her I could not second guess his doctor and how he was treating him. I recommended that she or her Celesta Gentile call his doctor's office and ask these questions. They would be better able to let her know why MD chose course of treatment that he did.  She asked if they should get second opinion. I told her anytime uncomfortable with a provider or what they recommend its fine to get a 2nd opinion. I told her she may want to ask them about it first.

## 2014-11-02 ENCOUNTER — Ambulatory Visit (INDEPENDENT_AMBULATORY_CARE_PROVIDER_SITE_OTHER): Payer: 59 | Admitting: Family

## 2014-11-02 ENCOUNTER — Encounter: Payer: Self-pay | Admitting: Family

## 2014-11-02 VITALS — BP 111/78 | HR 76 | Temp 97.2°F | Ht 61.0 in | Wt 137.6 lb

## 2014-11-02 DIAGNOSIS — F419 Anxiety disorder, unspecified: Secondary | ICD-10-CM

## 2014-11-02 DIAGNOSIS — F411 Generalized anxiety disorder: Secondary | ICD-10-CM | POA: Diagnosis not present

## 2014-11-02 DIAGNOSIS — I10 Essential (primary) hypertension: Secondary | ICD-10-CM | POA: Diagnosis not present

## 2014-11-02 DIAGNOSIS — F329 Major depressive disorder, single episode, unspecified: Secondary | ICD-10-CM

## 2014-11-02 DIAGNOSIS — F32A Depression, unspecified: Secondary | ICD-10-CM

## 2014-11-02 MED ORDER — DULOXETINE HCL 60 MG PO CPEP: 60.0000 mg | ORAL_CAPSULE | Freq: Every day | ORAL | Status: DC

## 2014-11-02 MED ORDER — BUPROPION HCL 100 MG PO TABS: 100.0000 mg | ORAL_TABLET | Freq: Two times a day (BID) | ORAL | Status: DC

## 2014-11-02 NOTE — Addendum Note (Signed)
Addended by: Earlene Plater on: 11/02/2014 03:19 PM   Modules accepted: Miquel Dunn

## 2014-11-02 NOTE — Patient Instructions (Signed)
Generalized Anxiety Disorder Generalized anxiety disorder (GAD) is a mental disorder. It interferes with life functions, including relationships, work, and school. GAD is different from normal anxiety, which everyone experiences at some point in their lives in response to specific life events and activities. Normal anxiety actually helps us prepare for and get through these life events and activities. Normal anxiety goes away after the event or activity is over.  GAD causes anxiety that is not necessarily related to specific events or activities. It also causes excess anxiety in proportion to specific events or activities. The anxiety associated with GAD is also difficult to control. GAD can vary from mild to severe. People with severe GAD can have intense waves of anxiety with physical symptoms (panic attacks).  SYMPTOMS The anxiety and worry associated with GAD are difficult to control. This anxiety and worry are related to many life events and activities and also occur more days than not for 6 months or longer. People with GAD also have three or more of the following symptoms (one or more in children):  Restlessness.   Fatigue.  Difficulty concentrating.   Irritability.  Muscle tension.  Difficulty sleeping or unsatisfying sleep. DIAGNOSIS GAD is diagnosed through an assessment by your health care provider. Your health care provider will ask you questions aboutyour mood,physical symptoms, and events in your life. Your health care provider may ask you about your medical history and use of alcohol or drugs, including prescription medicines. Your health care provider may also do a physical exam and blood tests. Certain medical conditions and the use of certain substances can cause symptoms similar to those associated with GAD. Your health care provider may refer you to a mental health specialist for further evaluation. TREATMENT The following therapies are usually used to treat GAD:    Medication. Antidepressant medication usually is prescribed for long-term daily control. Antianxiety medicines may be added in severe cases, especially when panic attacks occur.   Talk therapy (psychotherapy). Certain types of talk therapy can be helpful in treating GAD by providing support, education, and guidance. A form of talk therapy called cognitive behavioral therapy can teach you healthy ways to think about and react to daily life events and activities.  Stress managementtechniques. These include yoga, meditation, and exercise and can be very helpful when they are practiced regularly. A mental health specialist can help determine which treatment is best for you. Some people see improvement with one therapy. However, other people require a combination of therapies.   This information is not intended to replace advice given to you by your health care provider. Make sure you discuss any questions you have with your health care provider.   Document Released: 05/03/2012 Document Revised: 01/27/2014 Document Reviewed: 05/03/2012 Elsevier Interactive Patient Education 2016 [employer] Inc.  

## 2014-11-02 NOTE — Progress Notes (Signed)
Subjective:    Patient ID: Leslie Ritter, female    DOB: [birth year], 44 y.o.   MRN: [id number]  Pt presents to the office today for chronic follow up.  Hypertension This is a new problem. The current episode started more than 1 year ago. The problem has been resolved since onset. The problem is controlled. Associated symptoms include anxiety. Pertinent negatives include no headaches, palpitations, peripheral edema or shortness of breath. Risk factors for coronary artery disease include stress and family history. Past treatments include ACE inhibitors and diuretics. The current treatment provides significant improvement. There is no history of kidney disease, CAD/MI, CVA, heart failure or a thyroid problem. There is no history of sleep apnea.  Depression      The patient presents with depression.  This is a chronic problem.  The current episode started more than 1 year ago.   The onset quality is gradual.   The problem occurs rarely.  Associated symptoms include insomnia, irritable and restlessness.  Associated symptoms include no helplessness, no hopelessness, no headaches, not sad and no suicidal ideas.  Past treatments include SSRIs - Selective serotonin reuptake inhibitors.  Compliance with treatment is good.  Past medical history includes anxiety and depression.     Pertinent negatives include no thyroid problem. Anxiety Presents for follow-up visit. Symptoms include excessive worry, insomnia, irritability, nervous/anxious behavior and restlessness. Patient reports no depressed mood, palpitations, panic, shortness of breath or suicidal ideas. Symptoms occur most days. The severity of symptoms is moderate.   Her past medical history is significant for anxiety/panic attacks and depression. Past treatments include benzodiazephines, SSRIs and non-SSRI antidepressants.      Review of Systems  Constitutional: Positive for irritability.  HENT: Negative.   Eyes: Negative.   Respiratory: Negative.   Negative for shortness of breath.   Cardiovascular: Negative.  Negative for palpitations.  Gastrointestinal: Negative.   Endocrine: Negative.   Genitourinary: Negative.   Musculoskeletal: Negative.   Neurological: Negative.  Negative for headaches.  Hematological: Negative.   Psychiatric/Behavioral: Positive for depression. Negative for suicidal ideas. The patient is nervous/anxious and has insomnia.   All other systems reviewed and are negative.      Objective:   Physical Exam  Constitutional: She is oriented to person, place, and time. She appears well-developed and well-nourished. She is irritable. No distress.  HENT:  Head: Normocephalic and atraumatic.  Right Ear: External ear normal.  Left Ear: External ear normal.  Mouth/Throat: Oropharynx is clear and moist.  Eyes: Pupils are equal, round, and reactive to light.  Neck: Normal range of motion. Neck supple. No thyromegaly present.  Cardiovascular: Normal rate, regular rhythm, normal heart sounds and intact distal pulses.   No murmur heard. Pulmonary/Chest: Effort normal and breath sounds normal. No respiratory distress. She has no wheezes.  Abdominal: Soft. Bowel sounds are normal. She exhibits no distension. There is no tenderness.  Musculoskeletal: Normal range of motion. She exhibits no edema or tenderness.  Neurological: She is alert and oriented to person, place, and time. She has normal reflexes. No cranial nerve deficit.  Skin: Skin is warm and dry.  Psychiatric: She has a normal mood and affect. Her behavior is normal. Judgment and thought content normal.  Vitals reviewed.     BP 111/78 mmHg  Pulse 76  Temp(Src) 97.2 F (36.2 C)  Ht '5\' 1"'  (1.549 m)  Wt 137 lb 9.6 oz (62.415 kg)  BMI 26.01 kg/m2     Assessment & Plan:  1. Essential hypertension -  CMP14+EGFR   3. Depression - CMP14+EGFR - Thyroid Panel With TSH - DULoxetine (CYMBALTA) 60 MG capsule; Take 1 capsule (60 mg total) by mouth daily.   Dispense: 90 capsule; Refill: 1 - buPROPion (WELLBUTRIN) 100 MG tablet; Take 1 tablet (100 mg total) by mouth 2 (two) times daily.  Dispense: 90 tablet; Refill: 0  4. GAD (generalized anxiety disorder) -Stress management discussed -Lexapro stopped today -Cymbalta increased to 60 mg and Wellbutrin started today -RTO 4-6 weeks - CMP14+EGFR - Thyroid Panel With TSH - DULoxetine (CYMBALTA) 60 MG capsule; Take 1 capsule (60 mg total) by mouth daily.  Dispense: 90 capsule; Refill: 1 - buPROPion (WELLBUTRIN) 100 MG tablet; Take 1 tablet (100 mg total) by mouth 2 (two) times daily.  Dispense: 90 tablet; Refill: 0  Evelina Dun, FNP

## 2014-11-02 NOTE — Addendum Note (Signed)
Addended by: Evelina Dun A on: 11/02/2014 03:14 PM   Modules accepted: Orders

## 2014-11-03 LAB — CMP14+EGFR
ALK PHOS: 57 IU/L (ref 39–117)
ALT: 13 IU/L (ref 0–32)
AST: 15 IU/L (ref 0–40)
Albumin/Globulin Ratio: 2 (ref 1.1–2.5)
Albumin: 4.7 g/dL (ref 3.5–5.5)
BILIRUBIN TOTAL: 0.8 mg/dL (ref 0.0–1.2)
BUN / CREAT RATIO: 15 (ref 9–23)
BUN: 12 mg/dL (ref 6–24)
CHLORIDE: 95 mmol/L — AB (ref 97–108)
CO2: 28 mmol/L (ref 18–29)
Calcium: 9.4 mg/dL (ref 8.7–10.2)
Creatinine, Ser: 0.79 mg/dL (ref 0.57–1.00)
GFR calc Af Amer: 105 mL/min/{1.73_m2} (ref >59)
GFR calc non Af Amer: 91 mL/min/{1.73_m2} (ref >59)
GLUCOSE: 82 mg/dL (ref 65–99)
Globulin, Total: 2.4 g/dL (ref 1.5–4.5)
POTASSIUM: 4 mmol/L (ref 3.5–5.2)
Sodium: 140 mmol/L (ref 134–144)
Total Protein: 7.1 g/dL (ref 6.0–8.5)

## 2014-11-03 LAB — THYROID PANEL WITH TSH
Free Thyroxine Index: 1.9 (ref 1.2–4.9)
T3 UPTAKE RATIO: 30 % (ref 24–39)
T4, Total: 6.2 ug/dL (ref 4.5–12.0)
TSH: 1.03 u[IU]/mL (ref 0.450–4.500)

## 2014-11-22 ENCOUNTER — Other Ambulatory Visit: Payer: Self-pay | Admitting: *Deleted

## 2014-11-22 DIAGNOSIS — I10 Essential (primary) hypertension: Secondary | ICD-10-CM

## 2014-11-22 MED ORDER — LISINOPRIL-HYDROCHLOROTHIAZIDE 20-12.5 MG PO TABS: 1.0000 | ORAL_TABLET | Freq: Every day | ORAL | Status: DC

## 2014-12-08 ENCOUNTER — Encounter: Payer: Self-pay | Admitting: Family

## 2014-12-08 ENCOUNTER — Ambulatory Visit (INDEPENDENT_AMBULATORY_CARE_PROVIDER_SITE_OTHER): Payer: 59 | Admitting: Family

## 2014-12-08 VITALS — BP 125/86 | HR 84 | Temp 97.6°F | Ht 61.0 in | Wt 136.2 lb

## 2014-12-08 DIAGNOSIS — F419 Anxiety disorder, unspecified: Secondary | ICD-10-CM | POA: Diagnosis not present

## 2014-12-08 DIAGNOSIS — F411 Generalized anxiety disorder: Secondary | ICD-10-CM

## 2014-12-08 DIAGNOSIS — F32A Depression, unspecified: Secondary | ICD-10-CM

## 2014-12-08 DIAGNOSIS — I10 Essential (primary) hypertension: Secondary | ICD-10-CM

## 2014-12-08 DIAGNOSIS — F329 Major depressive disorder, single episode, unspecified: Secondary | ICD-10-CM | POA: Diagnosis not present

## 2014-12-08 MED ORDER — BUPROPION HCL ER (XL) 150 MG PO TB24: 150.0000 mg | ORAL_TABLET | Freq: Every day | ORAL | Status: DC

## 2014-12-08 NOTE — Progress Notes (Signed)
Subjective:    Patient ID: Leslie Ritter, female    DOB: 25-Mar-1970, 44 y.o.   MRN: [id number]  Pt presents to the office today for chronic follow up.  Anxiety Presents for follow-up visit. Onset was more than 5 years ago. The problem has been waxing and waning. Symptoms include excessive worry and restlessness. Patient reports no depressed mood, palpitations, panic, shortness of breath or suicidal ideas. Symptoms occur most days. The severity of symptoms is moderate.   Her past medical history is significant for anxiety/panic attacks and depression. Past treatments include benzodiazephines, SSRIs and non-SSRI antidepressants.  Hypertension This is a new problem. The current episode started more than 1 year ago. The problem has been resolved since onset. The problem is controlled. Associated symptoms include anxiety. Pertinent negatives include no headaches, palpitations, peripheral edema or shortness of breath. Risk factors for coronary artery disease include stress and family history. Past treatments include ACE inhibitors and diuretics. The current treatment provides significant improvement. There is no history of kidney disease, CAD/MI, CVA, heart failure or a thyroid problem. There is no history of sleep apnea.  Depression      The patient presents with depression.  This is a chronic problem.  The current episode started more than 1 year ago.   The onset quality is gradual.   The problem occurs rarely.  Associated symptoms include restlessness.  Associated symptoms include no helplessness, no hopelessness, not irritable, no headaches, not sad and no suicidal ideas.  Past treatments include SSRIs - Selective serotonin reuptake inhibitors.  Compliance with treatment is good.  Past medical history includes anxiety and depression.     Pertinent negatives include no thyroid problem.     Review of Systems  Constitutional: Negative.   HENT: Negative.   Eyes: Negative.   Respiratory: Negative.   Negative for shortness of breath.   Cardiovascular: Negative.  Negative for palpitations.  Gastrointestinal: Negative.   Endocrine: Negative.   Genitourinary: Negative.   Musculoskeletal: Negative.   Neurological: Negative.  Negative for headaches.  Hematological: Negative.   Psychiatric/Behavioral: Positive for depression. Negative for suicidal ideas.  All other systems reviewed and are negative.      Objective:   Physical Exam  Constitutional: She is oriented to person, place, and time. She appears well-developed and well-nourished. She is not irritable. No distress.  HENT:  Head: Normocephalic and atraumatic.  Right Ear: External ear normal.  Left Ear: External ear normal.  Nose: Nose normal.  Mouth/Throat: Oropharynx is clear and moist.  Eyes: Pupils are equal, round, and reactive to light.  Neck: Normal range of motion. Neck supple. No thyromegaly present.  Cardiovascular: Normal rate, regular rhythm, normal heart sounds and intact distal pulses.   No murmur heard. Pulmonary/Chest: Effort normal and breath sounds normal. No respiratory distress. She has no wheezes.  Abdominal: Soft. Bowel sounds are normal. She exhibits no distension. There is no tenderness.  Musculoskeletal: Normal range of motion. She exhibits no edema or tenderness.  Neurological: She is alert and oriented to person, place, and time. She has normal reflexes. No cranial nerve deficit.  Skin: Skin is warm and dry.  Psychiatric: She has a normal mood and affect. Her behavior is normal. Judgment and thought content normal.  Vitals reviewed.     BP 125/86 mmHg  Pulse 84  Temp(Src) 97.6 F (36.4 C) (Oral)  Ht 5\' 1"  (1.549 m)  Wt 136 lb 3.2 oz (61.78 kg)  BMI 25.75 kg/m2     Assessment &  Plan:  1. Essential hypertension  2. GAD (generalized anxiety disorder) -Pt started on Wellbutrin XL 150 mg daily from Wellbutrin 100 mg BID- Pt states she has a hard time taking the medication at two different  times - buPROPion (WELLBUTRIN XL) 150 MG 24 hr tablet; Take 1 tablet (150 mg total) by mouth daily.  Dispense: 90 tablet; Refill: 1  3. Depression -Stress management  - buPROPion (WELLBUTRIN XL) 150 MG 24 hr tablet; Take 1 tablet (150 mg total) by mouth daily.  Dispense: 90 tablet; Refill: 1  4. Anxiety   Continue all meds Labs pending Health Maintenance reviewed Diet and exercise encouraged RTO 3 months  Evelina Dun, FNP

## 2014-12-08 NOTE — Patient Instructions (Signed)
° °Generalized Anxiety Disorder °Generalized anxiety disorder (GAD) is a mental disorder. It interferes with life functions, including relationships, work, and school. °GAD is different from normal anxiety, which everyone experiences at some point in their lives in response to specific life events and activities. Normal anxiety actually helps us prepare for and get through these life events and activities. Normal anxiety goes away after the event or activity is over.  °GAD causes anxiety that is not necessarily related to specific events or activities. It also causes excess anxiety in proportion to specific events or activities. The anxiety associated with GAD is also difficult to control. GAD can vary from mild to severe. People with severe GAD can have intense waves of anxiety with physical symptoms (panic attacks).  °SYMPTOMS °The anxiety and worry associated with GAD are difficult to control. This anxiety and worry are related to many life events and activities and also occur more days than not for 6 months or longer. People with GAD also have three or more of the following symptoms (one or more in children): °· Restlessness.   °· Fatigue. °· Difficulty concentrating.   °· Irritability. °· Muscle tension. °· Difficulty sleeping or unsatisfying sleep. °DIAGNOSIS °GAD is diagnosed through an assessment by your health care provider. Your health care provider will ask you questions about your mood, physical symptoms, and events in your life. Your health care provider may ask you about your medical history and use of alcohol or drugs, including prescription medicines. Your health care provider may also do a physical exam and blood tests. Certain medical conditions and the use of certain substances can cause symptoms similar to those associated with GAD. Your health care provider may refer you to a mental health specialist for further evaluation. °TREATMENT °The following therapies are usually used to treat GAD:   °· Medication. Antidepressant medication usually is prescribed for long-term daily control. Antianxiety medicines may be added in severe cases, especially when panic attacks occur.   °· Talk therapy (psychotherapy). Certain types of talk therapy can be helpful in treating GAD by providing support, education, and guidance. A form of talk therapy called cognitive behavioral therapy can teach you healthy ways to think about and react to daily life events and activities. °· Stress management techniques. These include yoga, meditation, and exercise and can be very helpful when they are practiced regularly. °A mental health specialist can help determine which treatment is best for you. Some people see improvement with one therapy. However, other people require a combination of therapies. °  °This information is not intended to replace advice given to you by your health care provider. Make sure you discuss any questions you have with your health care provider. °  °Document Released: 05/03/2012 Document Revised: 01/27/2014 Document Reviewed: 05/03/2012 °Elsevier Interactive Patient Education ©2016 Else[employer] Inc[employer] ° °Major Depressive Disorder °Major depressive disorder is a mental illness. It also may be called clinical depression or unipolar depression. Major depressive disorder usually causes feelings of sadness, hopelessness, or helplessness. Some people with this disorder do not feel particularly sad but lose interest in doing things they used to enjoy (anhedonia). Major depressive disorder also can cause physical symptoms. It can interfere with work, school, relationships, and other normal everyday activities. The disorder varies in severity but is longer lasting and more serious than the sadness we all feel from time to time in our lives. °Major depressive disorder often is triggered by stressful life events or major life changes. Examples of these triggers include divorce, loss of your job or home,   a move, and the  death of a family member or close friend. Sometimes this disorder occurs for no obvious reason at all. People who have family members with major depressive disorder or bipolar disorder are at higher risk for developing this disorder, with or without life stressors. Major depressive disorder can occur at any age. It may occur just once in your life (single episode major depressive disorder). It may occur multiple times (recurrent major depressive disorder). °SYMPTOMS °People with major depressive disorder have either anhedonia or depressed mood on nearly a daily basis for at least 2 weeks or longer. Symptoms of depressed mood include: °· Feelings of sadness (blue or down in the dumps) or emptiness. °· Feelings of hopelessness or helplessness. °· Tearfulness or episodes of crying (may be observed by others). °· Irritability (children and adolescents). °In addition to depressed mood or anhedonia or both, people with this disorder have at least four of the following symptoms: °· Difficulty sleeping or sleeping too much.   °· Significant change (increase or decrease) in appetite or weight.   °· Lack of energy or motivation. °· Feelings of guilt and worthlessness.   °· Difficulty concentrating, remembering, or making decisions. °· Unusually slow movement (psychomotor retardation) or restlessness (as observed by others).   °· Recurrent wishes for death, recurrent thoughts of self-harm (suicide), or a suicide attempt. °People with major depressive disorder commonly have persistent negative thoughts about themselves, other people, and the world. People with severe major depressive disorder may experience distorted beliefs or perceptions about the world (psychotic delusions). They also may see or hear things that are not real (psychotic hallucinations). °DIAGNOSIS °Major depressive disorder is diagnosed through an assessment by your health care provider. Your health care provider will ask about aspects of your daily life,  such as mood, sleep, and appetite, to see if you have the diagnostic symptoms of major depressive disorder. Your health care provider may ask about your medical history and use of alcohol or drugs, including prescription medicines. Your health care provider also may do a physical exam and blood work. This is because certain medical conditions and the use of certain substances can cause major depressive disorder-like symptoms (secondary depression). Your health care provider also may refer you to a mental health specialist for further evaluation and treatment. °TREATMENT °It is important to recognize the symptoms of major depressive disorder and seek treatment. The following treatments can be prescribed for this disorder:   °· Medicine. Antidepressant medicines usually are prescribed. Antidepressant medicines are thought to correct chemical imbalances in the brain that are commonly associated with major depressive disorder. Other types of medicine may be added if the symptoms do not respond to antidepressant medicines alone or if psychotic delusions or hallucinations occur. °· Talk therapy. Talk therapy can be helpful in treating major depressive disorder by providing support, education, and guidance. Certain types of talk therapy also can help with negative thinking (cognitive behavioral therapy) and with relationship issues that trigger this disorder (interpersonal therapy). °A mental health specialist can help determine which treatment is best for you. Most people with major depressive disorder do well with a combination of medicine and talk therapy. Treatments involving electrical stimulation of the brain can be used in situations with extremely severe symptoms or when medicine and talk therapy do not work over time. These treatments include electroconvulsive therapy, transcranial magnetic stimulation, and vagal nerve stimulation. °  °This information is not intended to replace advice given to you by your health  care provider. Make sure you discuss any questions you have   with your health care provider. °  °Document Released: 05/03/2012 Document Revised: 01/27/2014 Document Reviewed: 05/03/2012 °Elsevier Interactive Patient Education ©2016 [employer] Inc. ° °

## 2015-01-05 ENCOUNTER — Encounter: Payer: Self-pay | Admitting: Family

## 2015-01-05 ENCOUNTER — Ambulatory Visit (INDEPENDENT_AMBULATORY_CARE_PROVIDER_SITE_OTHER): Payer: 59 | Admitting: Family

## 2015-01-05 DIAGNOSIS — F329 Major depressive disorder, single episode, unspecified: Secondary | ICD-10-CM

## 2015-01-05 DIAGNOSIS — F32A Depression, unspecified: Secondary | ICD-10-CM

## 2015-01-05 DIAGNOSIS — F411 Generalized anxiety disorder: Secondary | ICD-10-CM

## 2015-01-05 MED ORDER — BUPROPION HCL ER (XL) 300 MG PO TB24: 300.0000 mg | ORAL_TABLET | Freq: Every day | ORAL | Status: DC

## 2015-01-05 NOTE — Progress Notes (Signed)
   Subjective:    Patient ID: Leslie Ritter, female    DOB: 02/27/1970, 44 y.o.   MRN: [id number]  Pt presents to the office today to recheck GAD. Pt states she feels more "on edge" and anxious at times at work. Pt states she feels like some of this anxiety is related to menopause.  Anxiety Presents for follow-up visit. The problem has been waxing and waning. Symptoms include excessive worry, insomnia, irritability, nervous/anxious behavior and obsessions. Patient reports no depressed mood, palpitations, panic, shortness of breath or suicidal ideas. Symptoms occur most days. The symptoms are aggravated by family issues and social activities. The quality of sleep is fair.   Her past medical history is significant for anxiety/panic attacks.      Review of Systems  Constitutional: Positive for irritability.  HENT: Negative.   Eyes: Negative.   Respiratory: Negative.  Negative for shortness of breath.   Cardiovascular: Negative.  Negative for palpitations.  Gastrointestinal: Negative.   Endocrine: Negative.   Genitourinary: Negative.   Musculoskeletal: Negative.   Neurological: Negative.  Negative for headaches.  Hematological: Negative.   Psychiatric/Behavioral: Negative for suicidal ideas. The patient is nervous/anxious and has insomnia.   All other systems reviewed and are negative.      Objective:   Physical Exam  Constitutional: She is oriented to person, place, and time. She appears well-developed and well-nourished. No distress.  HENT:  Head: Normocephalic and atraumatic.  Right Ear: External ear normal.  Left Ear: External ear normal.  Nose: Nose normal.  Mouth/Throat: Oropharynx is clear and moist.  Eyes: Pupils are equal, round, and reactive to light.  Neck: Normal range of motion. Neck supple. No thyromegaly present.  Cardiovascular: Normal rate, regular rhythm, normal heart sounds and intact distal pulses.   No murmur heard. Pulmonary/Chest: Effort normal and breath  sounds normal. No respiratory distress. She has no wheezes.  Abdominal: Soft. Bowel sounds are normal. She exhibits no distension. There is no tenderness.  Musculoskeletal: Normal range of motion. She exhibits no edema or tenderness.  Neurological: She is alert and oriented to person, place, and time. She has normal reflexes. No cranial nerve deficit.  Skin: Skin is warm and dry.  Psychiatric: She has a normal mood and affect. Her behavior is normal. Judgment and thought content normal.  Vitals reviewed.     BP 121/82 mmHg  Pulse 96  Temp(Src) 98 F (36.7 C) (Oral)  Ht 5\' 1"  (1.549 m)  Wt 138 lb 3.2 oz (62.687 kg)  BMI 26.13 kg/m2     Assessment & Plan:  1. Depression  2. GAD (generalized anxiety disorder) -Wellbutrin increased to 300 mg today from 150 mg -Stress management discussed -Get plenty of rest RTO in 8 weeks - buPROPion (WELLBUTRIN XL) 300 MG 24 hr tablet; Take 1 tablet (300 mg total) by mouth daily.  Dispense: 90 tablet; Refill: 1   Continue all meds Health Maintenance reviewed Diet and exercise encouraged RTO 8 weeks  Evelina Dun, FNP

## 2015-01-05 NOTE — Patient Instructions (Signed)
Generalized Anxiety Disorder Generalized anxiety disorder (GAD) is a mental disorder. It interferes with life functions, including relationships, work, and school. GAD is different from normal anxiety, which everyone experiences at some point in their lives in response to specific life events and activities. Normal anxiety actually helps us prepare for and get through these life events and activities. Normal anxiety goes away after the event or activity is over.  GAD causes anxiety that is not necessarily related to specific events or activities. It also causes excess anxiety in proportion to specific events or activities. The anxiety associated with GAD is also difficult to control. GAD can vary from mild to severe. People with severe GAD can have intense waves of anxiety with physical symptoms (panic attacks).  SYMPTOMS The anxiety and worry associated with GAD are difficult to control. This anxiety and worry are related to many life events and activities and also occur more days than not for 6 months or longer. People with GAD also have three or more of the following symptoms (one or more in children):  Restlessness.   Fatigue.  Difficulty concentrating.   Irritability.  Muscle tension.  Difficulty sleeping or unsatisfying sleep. DIAGNOSIS GAD is diagnosed through an assessment by your health care provider. Your health care provider will ask you questions aboutyour mood,physical symptoms, and events in your life. Your health care provider may ask you about your medical history and use of alcohol or drugs, including prescription medicines. Your health care provider may also do a physical exam and blood tests. Certain medical conditions and the use of certain substances can cause symptoms similar to those associated with GAD. Your health care provider may refer you to a mental health specialist for further evaluation. TREATMENT The following therapies are usually used to treat GAD:    Medication. Antidepressant medication usually is prescribed for long-term daily control. Antianxiety medicines may be added in severe cases, especially when panic attacks occur.   Talk therapy (psychotherapy). Certain types of talk therapy can be helpful in treating GAD by providing support, education, and guidance. A form of talk therapy called cognitive behavioral therapy can teach you healthy ways to think about and react to daily life events and activities.  Stress managementtechniques. These include yoga, meditation, and exercise and can be very helpful when they are practiced regularly. A mental health specialist can help determine which treatment is best for you. Some people see improvement with one therapy. However, other people require a combination of therapies.   This information is not intended to replace advice given to you by your health care provider. Make sure you discuss any questions you have with your health care provider.   Document Released: 05/03/2012 Document Revised: 01/27/2014 Document Reviewed: 05/03/2012 Elsevier Interactive Patient Education 2016 [employer] Inc.  

## 2015-03-09 ENCOUNTER — Encounter: Payer: 59 | Admitting: Women's Health

## 2015-03-20 ENCOUNTER — Other Ambulatory Visit: Payer: Self-pay

## 2015-03-20 DIAGNOSIS — Z1231 Encounter for screening mammogram for malignant neoplasm of breast: Secondary | ICD-10-CM

## 2015-03-23 ENCOUNTER — Telehealth: Payer: Self-pay | Admitting: *Deleted

## 2015-03-23 ENCOUNTER — Encounter: Payer: Self-pay | Admitting: Women's Health

## 2015-03-23 ENCOUNTER — Ambulatory Visit (INDEPENDENT_AMBULATORY_CARE_PROVIDER_SITE_OTHER): Payer: 59 | Admitting: Women's Health

## 2015-03-23 VITALS — BP 132/88 | Ht 62.0 in | Wt 136.0 lb

## 2015-03-23 DIAGNOSIS — F411 Generalized anxiety disorder: Secondary | ICD-10-CM

## 2015-03-23 DIAGNOSIS — B373 Candidiasis of vulva and vagina: Secondary | ICD-10-CM

## 2015-03-23 DIAGNOSIS — B3731 Acute candidiasis of vulva and vagina: Secondary | ICD-10-CM

## 2015-03-23 DIAGNOSIS — Z01419 Encounter for gynecological examination (general) (routine) without abnormal findings: Secondary | ICD-10-CM | POA: Diagnosis not present

## 2015-03-23 MED ORDER — FLUCONAZOLE 100 MG PO TABS: 100.0000 mg | ORAL_TABLET | Freq: Every day | ORAL | Status: DC

## 2015-03-23 MED ORDER — ALPRAZOLAM 0.25 MG PO TABS: 0.2500 mg | ORAL_TABLET | Freq: Every evening | ORAL | Status: DC | PRN

## 2015-03-23 NOTE — Telephone Encounter (Signed)
Xanax 0.25 mg was called in since Rx was printed while at [facility] today.

## 2015-03-23 NOTE — Patient Instructions (Signed)
Menopause is a normal process in which your reproductive ability comes to an end. This process happens gradually over a span of months to years, usually between the ages of 48 and 55. Menopause is complete when you have missed 12 consecutive menstrual periods. It is important to talk with your health care provider about some of the most common conditions that affect postmenopausal women, such as heart disease, cancer, and bone loss (osteoporosis). Adopting a healthy lifestyle and getting preventive care can help to promote your health and wellness. Those actions can also lower your chances of developing some of these common conditions. WHAT SHOULD I KNOW ABOUT MENOPAUSE? During menopause, you may experience a number of symptoms, such as:  Moderate-to-severe hot flashes.  Night sweats.  Decrease in sex drive.  Mood swings.  Headaches.  Tiredness.  Irritability.  Memory problems.  Insomnia. Choosing to treat or not to treat menopausal changes is an individual decision that you make with your health care provider. WHAT SHOULD I KNOW ABOUT HORMONE REPLACEMENT THERAPY AND SUPPLEMENTS? Hormone therapy products are effective for treating symptoms that are associated with menopause, such as hot flashes and night sweats. Hormone replacement carries certain risks, especially as you become older. If you are thinking about using estrogen or estrogen with progestin treatments, discuss the benefits and risks with your health care provider. WHAT SHOULD I KNOW ABOUT HEART DISEASE AND STROKE? Heart disease, heart attack, and stroke become more likely as you age. This may be due, in part, to the hormonal changes that your body experiences during menopause. These can affect how your body processes dietary fats, triglycerides, and cholesterol. Heart attack and stroke are both medical emergencies. There are many things that you can do to help prevent heart disease and stroke:  Have your blood pressure  checked at least every 1-2 years. High blood pressure causes heart disease and increases the risk of stroke.  If you are 55-79 years old, ask your health care provider if you should take aspirin to prevent a heart attack or a stroke.  Do not use any tobacco products, including cigarettes, chewing tobacco, or electronic cigarettes. If you need help quitting, ask your health care provider.  It is important to eat a healthy diet and maintain a healthy weight.  Be sure to include plenty of vegetables, fruits, low-fat dairy products, and lean protein.  Avoid eating foods that are high in solid fats, added sugars, or salt (sodium).  Get regular exercise. This is one of the most important things that you can do for your health.  Try to exercise for at least 150 minutes each week. The type of exercise that you do should increase your heart rate and make you sweat. This is known as moderate-intensity exercise.  Try to do strengthening exercises at least twice each week. Do these in addition to the moderate-intensity exercise.  Know your numbers.Ask your health care provider to check your cholesterol and your blood glucose. Continue to have your blood tested as directed by your health care provider. WHAT SHOULD I KNOW ABOUT CANCER SCREENING? There are several types of cancer. Take the following steps to reduce your risk and to catch any cancer development as early as possible. Breast Cancer  Practice breast self-awareness.  This means understanding how your breasts normally appear and feel.  It also means doing regular breast self-exams. Let your health care provider know about any changes, no matter how small.  If you are 40 or older, have a clinician do a   breast exam (clinical breast exam or CBE) every year. Depending on your age, family history, and medical history, it may be recommended that you also have a yearly breast X-ray (mammogram).  If you have a family history of breast cancer,  talk with your health care provider about genetic screening.  If you are at high risk for breast cancer, talk with your health care provider about having an MRI and a mammogram every year.  Breast cancer (BRCA) gene test is recommended for women who have family members with BRCA-related cancers. Results of the assessment will determine the need for genetic counseling and BRCA1 and for BRCA2 testing. BRCA-related cancers include these types:  Breast. This occurs in males or females.  Ovarian.  Tubal. This may also be called fallopian tube cancer.  Cancer of the abdominal or pelvic lining (peritoneal cancer).  Prostate.  Pancreatic. Cervical, Uterine, and Ovarian Cancer Your health care provider may recommend that you be screened regularly for cancer of the pelvic organs. These include your ovaries, uterus, and vagina. This screening involves a pelvic exam, which includes checking for microscopic changes to the surface of your cervix (Pap test).  For women ages 21-65, health care providers may recommend a pelvic exam and a Pap test every three years. For women ages 77-65, they may recommend the Pap test and pelvic exam, combined with testing for human papilloma virus (HPV), every five years. Some types of HPV increase your risk of cervical cancer. Testing for HPV may also be done on women of any age who have unclear Pap test results.  Other health care providers may not recommend any screening for nonpregnant women who are considered low risk for pelvic cancer and have no symptoms. Ask your health care provider if a screening pelvic exam is right for you.  If you have had past treatment for cervical cancer or a condition that could lead to cancer, you need Pap tests and screening for cancer for at least 20 years after your treatment. If Pap tests have been discontinued for you, your risk factors (such as having a new sexual partner) need to be reassessed to determine if you should start having  screenings again. Some women have medical problems that increase the chance of getting cervical cancer. In these cases, your health care provider may recommend that you have screening and Pap tests more often.  If you have a family history of uterine cancer or ovarian cancer, talk with your health care provider about genetic screening.  If you have vaginal bleeding after reaching menopause, tell your health care provider.  There are currently no reliable tests available to screen for ovarian cancer. Lung Cancer Lung cancer screening is recommended for adults 3-70 years old who are at high risk for lung cancer because of a history of smoking. A yearly low-dose CT scan of the lungs is recommended if you:  Currently smoke.  Have a history of at least 30 pack-years of smoking and you currently smoke or have quit within the past 15 years. A pack-year is smoking an average of one pack of cigarettes per day for one year. Yearly screening should:  Continue until it has been 15 years since you quit.  Stop if you develop a health problem that would prevent you from having lung cancer treatment. Colorectal Cancer  This type of cancer can be detected and can often be prevented.  Routine colorectal cancer screening usually begins at age 38 and continues through age 12.  If you have  risk factors for colon cancer, your health care provider may recommend that you be screened at an earlier age.  If you have a family history of colorectal cancer, talk with your health care provider about genetic screening.  Your health care provider may also recommend using home test kits to check for hidden blood in your stool.  A small camera at the end of a tube can be used to examine your colon directly (sigmoidoscopy or colonoscopy). This is done to check for the earliest forms of colorectal cancer.  Direct examination of the colon should be repeated every 5-10 years until age 67. However, if early forms of  precancerous polyps or small growths are found or if you have a family history or genetic risk for colorectal cancer, you may need to be screened more often. Skin Cancer  Check your skin from head to toe regularly.  Monitor any moles. Be sure to tell your health care provider:  About any new moles or changes in moles, especially if there is a change in a mole's shape or color.  If you have a mole that is larger than the size of a pencil eraser.  If any of your family members has a history of skin cancer, especially at a [place] age, talk with your health care provider about genetic screening.  Always use sunscreen. Apply sunscreen liberally and repeatedly throughout the day.  Whenever you are outside, protect yourself by wearing long sleeves, pants, a wide-brimmed hat, and sunglasses. WHAT SHOULD I KNOW ABOUT OSTEOPOROSIS? Osteoporosis is a condition in which bone destruction happens more quickly than new bone creation. After menopause, you may be at an increased risk for osteoporosis. To help prevent osteoporosis or the bone fractures that can happen because of osteoporosis, the following is recommended:  If you are 39-61 years old, get at least 1,000 mg of calcium and at least 600 mg of vitamin D per day.  If you are older than age 16 but younger than age 7, get at least 1,200 mg of calcium and at least 600 mg of vitamin D per day.  If you are older than age 47, get at least 1,200 mg of calcium and at least 800 mg of vitamin D per day. Smoking and excessive alcohol intake increase the risk of osteoporosis. Eat foods that are rich in calcium and vitamin D, and do weight-bearing exercises several times each week as directed by your health care provider. WHAT SHOULD I KNOW ABOUT HOW MENOPAUSE AFFECTS [place]? Depression may occur at any age, but it is more common as you become older. Common symptoms of depression include:  Low or sad mood.  Changes in sleep patterns.  Changes  in appetite or eating patterns.  Feeling an overall lack of motivation or enjoyment of activities that you previously enjoyed.  Frequent crying spells. Talk with your health care provider if you think that you are experiencing depression. WHAT SHOULD I KNOW ABOUT IMMUNIZATIONS? It is important that you get and maintain your immunizations. These include:  Tetanus, diphtheria, and pertussis (Tdap) booster vaccine.  Influenza every year before the flu season begins.  Pneumonia vaccine.  Shingles vaccine. Your health care provider may also recommend other immunizations.   This information is not intended to replace advice given to you by your health care provider. Make sure you discuss any questions you have with your health care provider.   Document Released: 02/28/2005 Document Revised: 01/27/2014 Document Reviewed: 09/08/2013 Elsevier Interactive Patient Education 2016 Elsevier  Inc[employer] Generalized Anxiety Disorder Generalized anxiety disorder (GAD) is a mental disorder. It interferes with life functions, including relationships, work, and school. GAD is different from normal anxiety, which everyone experiences at some point in their lives in response to specific life events and activities. Normal anxiety actually helps us prepare for and get through these life events and activities. Normal anxiety goes away after the event or activity is over.  GAD causes anxiety that is not necessarily related to specific events or activities. It also causes excess anxiety in proportion to specific events or activities. The anxiety associated with GAD is also difficult to control. GAD can vary from mild to severe. People with severe GAD can have intense waves of anxiety with physical symptoms (panic attacks).  SYMPTOMS The anxiety and worry associated with GAD are difficult to control. This anxiety and worry are related to many life events and activities and also occur more days than not for 6 months or  longer. People with GAD also have three or more of the following symptoms (one or more in children):  Restlessness.   Fatigue.  Difficulty concentrating.   Irritability.  Muscle tension.  Difficulty sleeping or unsatisfying sleep. DIAGNOSIS GAD is diagnosed through an assessment by your health care provider. Your health care provider will ask you questions aboutyour mood,physical symptoms, and events in your life. Your health care provider may ask you about your medical history and use of alcohol or drugs, including prescription medicines. Your health care provider may also do a physical exam and blood tests. Certain medical conditions and the use of certain substances can cause symptoms similar to those associated with GAD. Your health care provider may refer you to a mental health specialist for further evaluation. TREATMENT The following therapies are usually used to treat GAD:   Medication. Antidepressant medication usually is prescribed for long-term daily control. Antianxiety medicines may be added in severe cases, especially when panic attacks occur.   Talk therapy (psychotherapy). Certain types of talk therapy can be helpful in treating GAD by providing support, education, and guidance. A form of talk therapy called cognitive behavioral therapy can teach you healthy ways to think about and react to daily life events and activities.  Stress managementtechniques. These include yoga, meditation, and exercise and can be very helpful when they are practiced regularly. A mental health specialist can help determine which treatment is best for you. Some people see improvement with one therapy. However, other people require a combination of therapies.   This information is not intended to replace advice given to you by your health care provider. Make sure you discuss any questions you have with your health care provider.   Document Released: 05/03/2012 Document Revised: 01/27/2014  Document Reviewed: 05/03/2012 Elsevier Interactive Patient Education 2016 [employer] Inc.  

## 2015-03-23 NOTE — Progress Notes (Signed)
Leslie Ritter 1971/01/14 [id number]    History:    Presents for annual exam.  Monthly cycle with slight irregularity in the past year/BTL. Same partner. 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 Normal mammogram history. Hypertension, anxiety and depression managed by primary care. HSV-1 rare outbreaks.  Past medical history, past surgical history, family history and social history were all reviewed and documented in the EPIC chart. 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 Leslie Ritter 21 doing well. Mother and sister hypertension.  ROS:  A ROS was performed and pertinent positives and negatives are included.  Exam:  Filed Vitals:   03/23/15 1126  BP: 132/88    General appearance:  Normal Thyroid:  Symmetrical, normal in size, without palpable masses or nodularity. Respiratory  Auscultation:  Clear without wheezing or rhonchi Cardiovascular  Auscultation:  Regular rate, without rubs, murmurs or gallops  Edema/varicosities:  Not grossly evident Abdominal  Soft,nontender, without masses, guarding or rebound.  Liver/spleen:  No organomegaly noted  Hernia:  None appreciated  Skin  Inspection:  Grossly normal   Breasts: Examined lying and sitting.     Right: Without masses, retractions, discharge or axillary adenopathy.     Left: Without masses, retractions, discharge or axillary adenopathy. Gentitourinary   Inguinal/mons:  Normal without inguinal adenopathy  External genitalia:  Normal  BUS/Urethra/Skene's glands:  Normal  Vagina:  Normal  Cervix:  Normal  Uterus:  normal in size, shape and contour.  Midline and mobile  Adnexa/parametria:     Rt: Without masses or tenderness.   Lt: Without masses or tenderness.  Anus and perineum: Normal  Digital rectal exam: Normal sphincter tone without palpated masses or tenderness  Assessment/Plan:  45 y.o. engaged WF G1 P1 for annual exam with no complaints.  Monthly cycle/BTL  occasional hot flushes 2015 LGSIL positive HR HPV Hypertension, anxiety/depression-primary care manages labs and meds HSV 1-rare outbreaks  Plan: SBE's, annual screening 3-D mammogram, breast tissue dense, regular exercise, calcium rich diet, vitamin D 1000 daily encouraged. Valtrex 500 twice daily for 3-5 days as needed. Prescription, proper use given and reviewed. Encouraged increased leisure activities. UA, Pap with HR HPV typing, new screening guidelines reviewed. Rare Xanax use, prescription 0.25 when necessary #30.  Huel Cote [facility], 12:26 PM 03/23/2015

## 2015-03-27 LAB — PAP IG AND HPV HIGH-RISK: HPV DNA High Risk: NOT DETECTED

## 2015-04-20 ENCOUNTER — Ambulatory Visit
Admission: RE | Admit: 2015-04-20 | Discharge: 2015-04-20 | Disposition: A | Discharge status: Home / Self Care | Payer: 59 | Source: Ambulatory Visit

## 2015-04-20 DIAGNOSIS — Z1231 Encounter for screening mammogram for malignant neoplasm of breast: Secondary | ICD-10-CM

## 2015-04-23 ENCOUNTER — Encounter: Payer: Self-pay | Admitting: Women's Health

## 2015-04-25 ENCOUNTER — Encounter: Payer: Self-pay | Admitting: Gynecology

## 2015-04-25 ENCOUNTER — Ambulatory Visit (INDEPENDENT_AMBULATORY_CARE_PROVIDER_SITE_OTHER): Payer: 59 | Admitting: Gynecology

## 2015-04-25 VITALS — BP 118/76

## 2015-04-25 DIAGNOSIS — R896 Abnormal cytological findings in specimens from other organs, systems and tissues: Secondary | ICD-10-CM | POA: Diagnosis not present

## 2015-04-25 DIAGNOSIS — IMO0002 Reserved for concepts with insufficient information to code with codable children: Secondary | ICD-10-CM

## 2015-04-25 NOTE — Patient Instructions (Signed)
Office will call with the biopsy results

## 2015-04-25 NOTE — Progress Notes (Signed)
    Nakisha Pickles Fly[place] 08-21-1970 [id number]        45 y.o.  G1P1 presents for colposcopy. 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 Subsequent LGSIL 2009. LGSIL cannot rule out high-grade lesion 2015. Colposcopy and biopsy were negative at that time. Most recent Pap smear LGSIL with negative high-risk HPV.  Past medical history,surgical history, problem list, medications, allergies, family history and social history were all reviewed and documented in the EPIC chart.  Directed ROS with pertinent positives and negatives documented in the history of present illness/assessment and plan.  Exam: Caryn Bee assistant Filed Vitals:   04/25/15 1127  BP: 118/76   General appearance:  Normal External BUS vagina normal. Cervix grossly normal. Uterus normal size midline mobile nontender. Adnexa without masses or tenderness.  Colposcopy performed after acetic acid cleanse is adequate with acetowhite change 12:00 transformation zone. Representative biopsy taken. ECC performed. Patient tolerated well. 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 Colposcopy shows acetowhite change at 12:00 transformation zone. Representative biopsy taken. ECC performed. Patient will follow up for results. I reviewed the whole issue of dysplasia, high-grade/low-grade, progression/regression. Various treatment options to include LEEP were discussed. Will follow up for biopsy results. If low-grade then plan expectant management with Pap smear one year. If high-grade then will proceed with LEEP.    [name], 11:41 AM 04/25/2015

## 2015-04-27 ENCOUNTER — Ambulatory Visit (INDEPENDENT_AMBULATORY_CARE_PROVIDER_SITE_OTHER): Payer: 59 | Admitting: Nurse Practitioner

## 2015-04-27 ENCOUNTER — Encounter: Payer: Self-pay | Admitting: Gynecology

## 2015-04-27 VITALS — BP 116/81 | HR 88 | Temp 97.5°F | Ht 62.0 in | Wt 136.0 lb

## 2015-04-27 DIAGNOSIS — F329 Major depressive disorder, single episode, unspecified: Secondary | ICD-10-CM | POA: Diagnosis not present

## 2015-04-27 DIAGNOSIS — F411 Generalized anxiety disorder: Secondary | ICD-10-CM

## 2015-04-27 DIAGNOSIS — I10 Essential (primary) hypertension: Secondary | ICD-10-CM

## 2015-04-27 DIAGNOSIS — F32A Depression, unspecified: Secondary | ICD-10-CM

## 2015-04-27 LAB — PATHOLOGY

## 2015-04-27 MED ORDER — VORTIOXETINE HBR 10 MG PO TABS: 10.0000 mg | ORAL_TABLET | Freq: Every day | ORAL | Status: DC

## 2015-04-27 MED ORDER — LISINOPRIL-HYDROCHLOROTHIAZIDE 20-12.5 MG PO TABS: 1.0000 | ORAL_TABLET | Freq: Every day | ORAL | Status: DC

## 2015-04-27 NOTE — Patient Instructions (Signed)
Stress and Stress Management Stress is a normal reaction to life events. It is what you feel when life demands more than you are used to or more than you can handle. Some stress can be useful. For example, the stress reaction can help you catch the last bus of the day, study for a test, or meet a deadline at work. But stress that occurs too often or for too long can cause problems. It can affect your emotional health and interfere with relationships and normal daily activities. Too much stress can weaken your immune system and increase your risk for physical illness. If you already have a medical problem, stress can make it worse. CAUSES  All sorts of life events may cause stress. An event that causes stress for one person may not be stressful for another person. Major life events commonly cause stress. These may be positive or negative. Examples include losing your job, moving into a new home, getting married, having a baby, or losing a loved one. Less obvious life events may also cause stress, especially if they occur day after day or in combination. Examples include working long hours, driving in traffic, caring for children, being in debt, or being in a difficult relationship. SIGNS AND SYMPTOMS Stress may cause emotional symptoms including, the following:  Anxiety. This is feeling worried, afraid, on edge, overwhelmed, or out of control.  Anger. This is feeling irritated or impatient.  Depression. This is feeling sad, down, helpless, or guilty.  Difficulty focusing, remembering, or making decisions. Stress may cause physical symptoms, including the following:   Aches and pains. These may affect your head, neck, back, stomach, or other areas of your body.  Tight muscles or clenched jaw.  Low energy or trouble sleeping. Stress may cause unhealthy behaviors, including the following:   Eating to feel better (overeating) or skipping meals.  Sleeping too little, too much, or both.  Working  too much or putting off tasks (procrastination).  Smoking, drinking alcohol, or using drugs to feel better. DIAGNOSIS  Stress is diagnosed through an assessment by your health care provider. Your health care provider will ask questions about your symptoms and any stressful life events.Your health care provider will also ask about your medical history and may order blood tests or other tests. Certain medical conditions and medicine can cause physical symptoms similar to stress. Mental illness can cause emotional symptoms and unhealthy behaviors similar to stress. Your health care provider may refer you to a mental health professional for further evaluation.  TREATMENT  Stress management is the recommended treatment for stress.The goals of stress management are reducing stressful life events and coping with stress in healthy ways.  Techniques for reducing stressful life events include the following:  Stress identification. Self-monitor for stress and identify what causes stress for you. These skills may help you to avoid some stressful events.  Time management. Set your priorities, keep a calendar of events, and learn to say "no." These tools can help you avoid making too many commitments. Techniques for coping with stress include the following:  Rethinking the problem. Try to think realistically about stressful events rather than ignoring them or overreacting. Try to find the positives in a stressful situation rather than focusing on the negatives.  Exercise. Physical exercise can release both physical and emotional tension. The key is to find a form of exercise you enjoy and do it regularly.  Relaxation techniques. These relax the body and mind. Examples include yoga, meditation, tai chi, biofeedback, deep  breathing, progressive muscle relaxation, listening to music, being out in nature, journaling, and other hobbies. Again, the key is to find one or more that you enjoy and can do  regularly.  Healthy lifestyle. Eat a balanced diet, get plenty of sleep, and do not smoke. Avoid using alcohol or drugs to relax.  Strong support network. Spend time with family, friends, or other people you enjoy being around.Express your feelings and talk things over with someone you trust. Counseling or talktherapy with a mental health professional may be helpful if you are having difficulty managing stress on your own. Medicine is typically not recommended for the treatment of stress.Talk to your health care provider if you think you need medicine for symptoms of stress. HOME CARE INSTRUCTIONS  Keep all follow-up visits as directed by your health care provider.  Take all medicines as directed by your health care provider. SEEK MEDICAL CARE IF:  Your symptoms get worse or you start having new symptoms.  You feel overwhelmed by your problems and can no longer manage them on your own. SEEK IMMEDIATE MEDICAL CARE IF:  You feel like hurting yourself or someone else.   This information is not intended to replace advice given to you by your health care provider. Make sure you discuss any questions you have with your health care provider.   Document Released: 07/02/2000 Document Revised: 01/27/2014 Document Reviewed: 08/31/2012 Elsevier Interactive Patient Education 2016 [employer]  

## 2015-04-27 NOTE — Progress Notes (Signed)
Subjective:    Patient ID: Leslie Ritter, female    DOB: [birth year], 45 y.o.   MRN: [id number]  Patient here today for follow up of chronic medical problems.  Outpatient Encounter Prescriptions as of 04/27/2015  Medication Sig  . ALPRAZolam (XANAX) 0.25 MG tablet Take 1 tablet (0.25 mg total) by mouth at bedtime as needed for sleep.  Marland Kitchen lisinopril-hydrochlorothiazide (PRINZIDE,ZESTORETIC) 20-12.5 MG tablet Take 1 tablet by mouth daily.  . valACYclovir (VALTREX) 500 MG tablet Take 1 tablet (500 mg total) by mouth 2 (two) times daily.   No facility-administered encounter medications on file as of 04/27/2015.     Hypertension This is a new problem. The current episode started more than 1 year ago. The problem has been resolved since onset. The problem is controlled. Pertinent negatives include no peripheral edema. Risk factors for coronary artery disease include stress and family history. Past treatments include ACE inhibitors and diuretics. The current treatment provides significant improvement. There is no history of kidney disease, CAD/MI, CVA or heart failure. There is no history of sleep apnea.  GAD/Depression She weaned herself off of her cymbalta and wellbutin because she did not feel that it was working- she still takes her xanax on occasion but does not like to take every day= feels like she still really needs something.  Review of Systems  Constitutional: Negative.   HENT: Negative.   Eyes: Negative.   Respiratory: Negative.   Cardiovascular: Negative.   Gastrointestinal: Negative.   Endocrine: Negative.   Genitourinary: Negative.   Musculoskeletal: Negative.   Neurological: Negative.   Hematological: Negative.   All other systems reviewed and are negative.      Objective:   Physical Exam  Constitutional: She is oriented to person, place, and time. She appears well-developed and well-nourished. No distress.  HENT:  Head: Normocephalic and atraumatic.  Right Ear: External  ear normal.  Left Ear: External ear normal.  Nose: Nose normal.  Mouth/Throat: Oropharynx is clear and moist.  Eyes: Pupils are equal, round, and reactive to light.  Neck: Normal range of motion. Neck supple. No thyromegaly present.  Cardiovascular: Normal rate, regular rhythm, normal heart sounds and intact distal pulses.   No murmur heard. Pulmonary/Chest: Effort normal and breath sounds normal. No respiratory distress. She has no wheezes.  Abdominal: Soft. Bowel sounds are normal. She exhibits no distension. There is no tenderness.  Musculoskeletal: Normal range of motion. She exhibits no edema or tenderness.  Neurological: She is alert and oriented to person, place, and time. She has normal reflexes. No cranial nerve deficit.  Skin: Skin is warm and dry.  Psychiatric: She has a normal mood and affect. Her behavior is normal. Judgment and thought content normal.  Vitals reviewed.  BP 116/81 mmHg  Pulse 88  Temp(Src) 97.5 F (36.4 C) (Oral)  Ht '5\' 2"'$  (1.575 m)  Wt 136 lb (61.689 kg)  BMI 24.87 kg/m2  LMP 03/29/2015      Assessment & Plan:  1. Essential hypertension Do not add salt to diet  - lisinopril-hydrochlorothiazide (PRINZIDE,ZESTORETIC) 20-12.5 MG tablet; Take 1 tablet by mouth daily.  Dispense: 90 tablet; Refill: 1 - CMP14+EGFR - Lipid panel  2. Depression Stress management  3. GAD (generalized anxiety disorder) Going to try trintellix - Vortioxetine HBr (TRINTELLIX) 10 MG TABS; Take 1 tablet (10 mg total) by mouth daily.  Dispense: 30 tablet; Refill: 3    Labs pending Health maintenance reviewed Diet and exercise encouraged Continue all meds Follow up  In 3 months   Mary-Margaret Hassell Done, FNP

## 2015-04-28 LAB — CMP14+EGFR
A/G RATIO: 1.9 (ref 1.2–2.2)
ALBUMIN: 4.7 g/dL (ref 3.5–5.5)
ALT: 11 IU/L (ref 0–32)
AST: 15 IU/L (ref 0–40)
Alkaline Phosphatase: 49 IU/L (ref 39–117)
BILIRUBIN TOTAL: 0.7 mg/dL (ref 0.0–1.2)
BUN / CREAT RATIO: 19 (ref 9–23)
BUN: 14 mg/dL (ref 6–24)
CALCIUM: 9.4 mg/dL (ref 8.7–10.2)
CHLORIDE: 96 mmol/L (ref 96–106)
CO2: 22 mmol/L (ref 18–29)
Creatinine, Ser: 0.75 mg/dL (ref 0.57–1.00)
GFR, EST AFRICAN AMERICAN: 111 mL/min/{1.73_m2} (ref >59)
GFR, EST NON AFRICAN AMERICAN: 97 mL/min/{1.73_m2} (ref >59)
GLOBULIN, TOTAL: 2.5 g/dL (ref 1.5–4.5)
Glucose: 113 mg/dL — ABNORMAL HIGH (ref 65–99)
POTASSIUM: 3.7 mmol/L (ref 3.5–5.2)
Sodium: 138 mmol/L (ref 134–144)
TOTAL PROTEIN: 7.2 g/dL (ref 6.0–8.5)

## 2015-04-28 LAB — LIPID PANEL
CHOL/HDL RATIO: 1.7 ratio (ref 0.0–4.4)
Cholesterol, Total: 159 mg/dL (ref 100–199)
HDL: 93 mg/dL (ref >39)
LDL Calculated: 56 mg/dL (ref 0–99)
Triglycerides: 48 mg/dL (ref 0–149)
VLDL Cholesterol Cal: 10 mg/dL (ref 5–40)

## 2015-05-01 ENCOUNTER — Ambulatory Visit: Payer: 59 | Admitting: Nurse Practitioner

## 2015-05-03 ENCOUNTER — Telehealth: Payer: Self-pay

## 2015-05-03 NOTE — Telephone Encounter (Signed)
Insurance authorized Trintellix through 04/30/16

## 2015-05-24 ENCOUNTER — Telehealth: Payer: Self-pay | Admitting: Family

## 2015-05-24 MED ORDER — VORTIOXETINE HBR 20 MG PO TABS: 20.0000 mg | ORAL_TABLET | Freq: Every day | ORAL | Status: DC

## 2015-05-24 NOTE — Telephone Encounter (Signed)
trintellix rx snet to pharmacy

## 2015-05-24 NOTE — Telephone Encounter (Signed)
Detailed message left for patient.

## 2015-10-25 ENCOUNTER — Ambulatory Visit (INDEPENDENT_AMBULATORY_CARE_PROVIDER_SITE_OTHER): Payer: 59 | Admitting: Nurse Practitioner

## 2015-10-25 ENCOUNTER — Encounter: Payer: Self-pay | Admitting: Nurse Practitioner

## 2015-10-25 VITALS — BP 102/72 | HR 72 | Temp 97.7°F | Ht 62.0 in | Wt 134.0 lb

## 2015-10-25 DIAGNOSIS — F411 Generalized anxiety disorder: Secondary | ICD-10-CM

## 2015-10-25 DIAGNOSIS — I10 Essential (primary) hypertension: Secondary | ICD-10-CM | POA: Diagnosis not present

## 2015-10-25 MED ORDER — ALPRAZOLAM 0.25 MG PO TABS: 0.2500 mg | ORAL_TABLET | Freq: Every evening | ORAL | 1 refills | Status: DC | PRN

## 2015-10-25 MED ORDER — ESCITALOPRAM OXALATE 20 MG PO TABS: 20.0000 mg | ORAL_TABLET | Freq: Every day | ORAL | 5 refills | Status: DC

## 2015-10-25 MED ORDER — LISINOPRIL-HYDROCHLOROTHIAZIDE 20-12.5 MG PO TABS: 1.0000 | ORAL_TABLET | Freq: Every day | ORAL | 1 refills | Status: DC

## 2015-10-25 NOTE — Progress Notes (Signed)
Subjective:    Patient ID: Leslie Ritter, female    DOB: 04/14/1970, 45 y.o.   MRN: [id number]  Patient here today for follow up of chronic medical problems. Also would like a new medication for her anxiety as she feels the Trintellix was not helping her.   Outpatient Encounter Prescriptions as of 10/25/2015  Medication Sig  . ALPRAZolam (XANAX) 0.25 MG tablet Take 1 tablet (0.25 mg total) by mouth at bedtime as needed for sleep.  Marland Kitchen lisinopril-hydrochlorothiazide (PRINZIDE,ZESTORETIC) 20-12.5 MG tablet Take 1 tablet by mouth daily.  . valACYclovir (VALTREX) 500 MG tablet Take 1 tablet (500 mg total) by mouth 2 (two) times daily. (Patient not taking: Reported on 10/25/2015)  . [DISCONTINUED] Vortioxetine HBr (TRINTELLIX) 20 MG TABS Take 20 mg by mouth daily.   No facility-administered encounter medications on file as of 10/25/2015.      Hypertension  This is a new problem. The current episode started more than 1 year ago. The problem has been resolved since onset. The problem is controlled. Associated symptoms include chest pain. Pertinent negatives include no peripheral edema. Risk factors for coronary artery disease include stress and family history. Past treatments include ACE inhibitors and diuretics. The current treatment provides significant improvement. There is no history of kidney disease, CAD/MI, CVA or heart failure. There is no history of sleep apnea.  GAD/Depression Was put on Trintellix at her last visit. She tried it for several months, even at an increased dosage, but feels it was not helpful. States she will feel tired, fall asleep, but then suddenly wake up and not be able to go back to sleep for awhile. Sometimes there is something on her mind but often times there isn't.  Also reports she has nausea. States she loves coffee but hasn't drank any lately because she can't stand the thought of the nausea. In addition, she wakes up at night with her arms aching and going to sleep.  She wonders if these two symptoms are related to her anxiety and not sleeping well.  GAD 7 : Generalized Anxiety Score 10/25/2015 04/27/2015  Nervous, Anxious, on Edge 3 3  Control/stop worrying 2 3  Worry too much - different things 2 3  Trouble relaxing 2 3  Restless 2 3  Easily annoyed or irritable 2 3  Afraid - awful might happen 1 0  Total GAD 7 Score 14 18  Anxiety Difficulty Not difficult at all Very difficult     Review of Systems  Constitutional: Negative.  Negative for appetite change and unexpected weight change.  HENT: Negative.   Eyes: Negative.   Respiratory: Negative.   Cardiovascular: Positive for chest pain.       Reports chest pain/tightness at times of high anxiety.  Gastrointestinal: Positive for nausea.  Endocrine: Negative.   Genitourinary: Negative.   Musculoskeletal: Negative.   Neurological: Negative.   Hematological: Negative.   Psychiatric/Behavioral: Negative for self-injury and suicidal ideas. The patient is nervous/anxious.   All other systems reviewed and are negative.      Objective:   Physical Exam  Constitutional: She is oriented to person, place, and time. She appears well-developed and well-nourished. No distress.  HENT:  Head: Normocephalic and atraumatic.  Right Ear: External ear normal.  Left Ear: External ear normal.  Nose: Nose normal.  Mouth/Throat: Oropharynx is clear and moist.  Eyes: Conjunctivae and EOM are normal. Pupils are equal, round, and reactive to light.  Neck: Normal range of motion. Neck supple. No thyromegaly present.  Cardiovascular: Normal rate, regular rhythm, normal heart sounds and intact distal pulses.   No murmur heard. Pulmonary/Chest: Effort normal and breath sounds normal. No respiratory distress. She has no wheezes.  Abdominal: Soft. Bowel sounds are normal.  Musculoskeletal: Normal range of motion. She exhibits no edema or tenderness.  Lymphadenopathy:    She has no cervical adenopathy.  Neurological:  She is alert and oriented to person, place, and time. She has normal reflexes. No cranial nerve deficit.  Skin: Skin is warm and dry.  Psychiatric: She has a normal mood and affect. Her behavior is normal. Judgment and thought content normal.  Vitals reviewed.  BP 102/72   Pulse 72   Temp 97.7 F (36.5 C) (Oral)   Ht '5\' 2"'  (1.575 m)   Wt 134 lb (60.8 kg)   BMI 24.51 kg/m       Assessment & Plan:  1. Anxiety state Stress management education provided - ALPRAZolam (XANAX) 0.25 MG tablet; Take 1 tablet (0.25 mg total) by mouth at bedtime as needed for sleep.  Dispense: 30 tablet; Refill: 1  2. Essential hypertension Do not add salt to diet. - lisinopril-hydrochlorothiazide (PRINZIDE,ZESTORETIC) 20-12.5 MG tablet; Take 1 tablet by mouth daily.  Dispense: 90 tablet; Refill: 1 - Lipid panel - CMP14+EGFR  3. GAD (generalized anxiety disorder) Stress management education provided - escitalopram (LEXAPRO) 20 MG tablet; Take 1 tablet (20 mg total) by mouth daily.  Dispense: 30 tablet; Refill: 5    Labs pending Health maintenance reviewed Diet and exercise encouraged Continue all meds Follow up  In 3 months   Hendricks Limes, BSN-RN/FNP student  [place], [place]

## 2015-10-25 NOTE — Patient Instructions (Signed)
Stress and Stress Management Stress is a normal reaction to life events. It is what you feel when life demands more than you are used to or more than you can handle. Some stress can be useful. For example, the stress reaction can help you catch the last bus of the day, study for a test, or meet a deadline at work. But stress that occurs too often or for too long can cause problems. It can affect your emotional health and interfere with relationships and normal daily activities. Too much stress can weaken your immune system and increase your risk for physical illness. If you already have a medical problem, stress can make it worse. CAUSES  All sorts of life events may cause stress. An event that causes stress for one person may not be stressful for another person. Major life events commonly cause stress. These may be positive or negative. Examples include losing your job, moving into a new home, getting married, having a baby, or losing a loved one. Less obvious life events may also cause stress, especially if they occur day after day or in combination. Examples include working long hours, driving in traffic, caring for children, being in debt, or being in a difficult relationship. SIGNS AND SYMPTOMS Stress may cause emotional symptoms including, the following:  Anxiety. This is feeling worried, afraid, on edge, overwhelmed, or out of control.  Anger. This is feeling irritated or impatient.  Depression. This is feeling sad, down, helpless, or guilty.  Difficulty focusing, remembering, or making decisions. Stress may cause physical symptoms, including the following:   Aches and pains. These may affect your head, neck, back, stomach, or other areas of your body.  Tight muscles or clenched jaw.  Low energy or trouble sleeping. Stress may cause unhealthy behaviors, including the following:   Eating to feel better (overeating) or skipping meals.  Sleeping too little, too much, or both.  Working  too much or putting off tasks (procrastination).  Smoking, drinking alcohol, or using drugs to feel better. DIAGNOSIS  Stress is diagnosed through an assessment by your health care provider. Your health care provider will ask questions about your symptoms and any stressful life events.Your health care provider will also ask about your medical history and may order blood tests or other tests. Certain medical conditions and medicine can cause physical symptoms similar to stress. Mental illness can cause emotional symptoms and unhealthy behaviors similar to stress. Your health care provider may refer you to a mental health professional for further evaluation.  TREATMENT  Stress management is the recommended treatment for stress.The goals of stress management are reducing stressful life events and coping with stress in healthy ways.  Techniques for reducing stressful life events include the following:  Stress identification. Self-monitor for stress and identify what causes stress for you. These skills may help you to avoid some stressful events.  Time management. Set your priorities, keep a calendar of events, and learn to say "no." These tools can help you avoid making too many commitments. Techniques for coping with stress include the following:  Rethinking the problem. Try to think realistically about stressful events rather than ignoring them or overreacting. Try to find the positives in a stressful situation rather than focusing on the negatives.  Exercise. Physical exercise can release both physical and emotional tension. The key is to find a form of exercise you enjoy and do it regularly.  Relaxation techniques. These relax the body and mind. Examples include yoga, meditation, tai chi, biofeedback, deep  breathing, progressive muscle relaxation, listening to music, being out in nature, journaling, and other hobbies. Again, the key is to find one or more that you enjoy and can do  regularly.  Healthy lifestyle. Eat a balanced diet, get plenty of sleep, and do not smoke. Avoid using alcohol or drugs to relax.  Strong support network. Spend time with family, friends, or other people you enjoy being around.Express your feelings and talk things over with someone you trust. Counseling or talktherapy with a mental health professional may be helpful if you are having difficulty managing stress on your own. Medicine is typically not recommended for the treatment of stress.Talk to your health care provider if you think you need medicine for symptoms of stress. HOME CARE INSTRUCTIONS  Keep all follow-up visits as directed by your health care provider.  Take all medicines as directed by your health care provider. SEEK MEDICAL CARE IF:  Your symptoms get worse or you start having new symptoms.  You feel overwhelmed by your problems and can no longer manage them on your own. SEEK IMMEDIATE MEDICAL CARE IF:  You feel like hurting yourself or someone else.   This information is not intended to replace advice given to you by your health care provider. Make sure you discuss any questions you have with your health care provider.   Document Released: 07/02/2000 Document Revised: 01/27/2014 Document Reviewed: 08/31/2012 Elsevier Interactive Patient Education 2016 [employer]  

## 2015-10-26 LAB — CMP14+EGFR
ALK PHOS: 50 IU/L (ref 39–117)
ALT: 10 IU/L (ref 0–32)
AST: 12 IU/L (ref 0–40)
Albumin/Globulin Ratio: 2.2 (ref 1.2–2.2)
Albumin: 5.3 g/dL (ref 3.5–5.5)
BUN/Creatinine Ratio: 14 (ref 9–23)
BUN: 10 mg/dL (ref 6–24)
Bilirubin Total: 0.7 mg/dL (ref 0.0–1.2)
CHLORIDE: 95 mmol/L — AB (ref 96–106)
CO2: 26 mmol/L (ref 18–29)
CREATININE: 0.7 mg/dL (ref 0.57–1.00)
Calcium: 10.2 mg/dL (ref 8.7–10.2)
GFR calc Af Amer: 121 mL/min/{1.73_m2} (ref >59)
GFR calc non Af Amer: 105 mL/min/{1.73_m2} (ref >59)
GLOBULIN, TOTAL: 2.4 g/dL (ref 1.5–4.5)
Glucose: 93 mg/dL (ref 65–99)
POTASSIUM: 5.1 mmol/L (ref 3.5–5.2)
SODIUM: 138 mmol/L (ref 134–144)
Total Protein: 7.7 g/dL (ref 6.0–8.5)

## 2015-10-26 LAB — LIPID PANEL
CHOLESTEROL TOTAL: 174 mg/dL (ref 100–199)
Chol/HDL Ratio: 1.6 ratio units (ref 0.0–4.4)
HDL: 110 mg/dL (ref >39)
LDL CALC: 53 mg/dL (ref 0–99)
Triglycerides: 54 mg/dL (ref 0–149)
VLDL CHOLESTEROL CAL: 11 mg/dL (ref 5–40)

## 2016-03-27 ENCOUNTER — Encounter: Payer: Self-pay | Admitting: Women's Health

## 2016-03-27 DIAGNOSIS — X32XXXD Exposure to sunlight, subsequent encounter: Secondary | ICD-10-CM | POA: Diagnosis not present

## 2016-03-27 DIAGNOSIS — Z1283 Encounter for screening for malignant neoplasm of skin: Secondary | ICD-10-CM | POA: Diagnosis not present

## 2016-03-27 DIAGNOSIS — L570 Actinic keratosis: Secondary | ICD-10-CM | POA: Diagnosis not present

## 2016-03-27 DIAGNOSIS — D485 Neoplasm of uncertain behavior of skin: Secondary | ICD-10-CM | POA: Diagnosis not present

## 2016-03-27 DIAGNOSIS — D225 Melanocytic nevi of trunk: Secondary | ICD-10-CM | POA: Diagnosis not present

## 2016-03-28 ENCOUNTER — Ambulatory Visit (INDEPENDENT_AMBULATORY_CARE_PROVIDER_SITE_OTHER): Payer: 59 | Admitting: Women's Health

## 2016-03-28 ENCOUNTER — Telehealth: Payer: Self-pay | Admitting: *Deleted

## 2016-03-28 ENCOUNTER — Other Ambulatory Visit: Payer: Self-pay | Admitting: Women's Health

## 2016-03-28 ENCOUNTER — Encounter: Payer: Self-pay | Admitting: Women's Health

## 2016-03-28 VITALS — BP 126/74 | Ht 61.5 in | Wt 140.0 lb

## 2016-03-28 DIAGNOSIS — F411 Generalized anxiety disorder: Secondary | ICD-10-CM

## 2016-03-28 DIAGNOSIS — N938 Other specified abnormal uterine and vaginal bleeding: Secondary | ICD-10-CM | POA: Diagnosis not present

## 2016-03-28 DIAGNOSIS — Z01419 Encounter for gynecological examination (general) (routine) without abnormal findings: Secondary | ICD-10-CM

## 2016-03-28 DIAGNOSIS — Z1151 Encounter for screening for human papillomavirus (HPV): Secondary | ICD-10-CM | POA: Diagnosis not present

## 2016-03-28 DIAGNOSIS — B3731 Acute candidiasis of vulva and vagina: Secondary | ICD-10-CM

## 2016-03-28 DIAGNOSIS — B373 Candidiasis of vulva and vagina: Secondary | ICD-10-CM

## 2016-03-28 MED ORDER — ALPRAZOLAM 0.25 MG PO TABS: 0.2500 mg | ORAL_TABLET | Freq: Every evening | ORAL | 1 refills | Status: DC | PRN

## 2016-03-28 MED ORDER — FLUCONAZOLE 100 MG PO TABS: 100.0000 mg | ORAL_TABLET | Freq: Every day | ORAL | 0 refills | Status: DC

## 2016-03-28 NOTE — Telephone Encounter (Signed)
Telephone call , states takes Diflucan 100 mg one tablet each month with her cycle has had good relief and prevention of yeast. Rx E scribed.

## 2016-03-28 NOTE — Progress Notes (Signed)
Leslie Ritter [birth year] [id number]    History:    Presents for annual exam .Monthy cycle with spotting between periods in the last 3-4 months. History of normal pap and mammogram. Hypertension, anxiety and depression managed by primary care. HSV-1 rare outbreak. Tubal ligation.   Past medical history, past surgical history, family history and social history were all reviewed and documented in the EPIC chart. Engaged 4 years, has one son 47 years old, in school and working, met her biological father this week.   ROS:  A ROS was performed and pertinent positives and negatives are included.  Exam:  Vitals:   03/28/16 1023  BP: 126/74  Weight: 140 lb (63.5 kg)  Height: 5' 1.5" (1.562 m)   Body mass index is 26.02 kg/m.   General appearance:  Normal Thyroid:  Symmetrical, normal in size, without palpable masses or nodularity. Respiratory  Auscultation:  Clear without wheezing or rhonchi Cardiovascular  Auscultation:  Regular rate, without rubs, murmurs or gallops  Edema/varicosities:  Not grossly evident Abdominal  Soft,nontender, without masses, guarding or rebound.  Liver/spleen:  No organomegaly noted  Hernia:  None appreciated  Skin  Inspection:  Grossly normal Removed two moles by dermatologist.    Breasts: Examined lying and sitting.     Right: Without masses, retractions, discharge or axillary adenopathy.     Left: Without masses, retractions, discharge or axillary adenopathy. Gentitourinary   Inguinal/mons:  Normal without inguinal adenopathy  External genitalia:  Normal  BUS/Urethra/Skene's glands:  Normal  Vagina:  Normal, no bleeding  Cervix:  Normal, no bleeding, Pap smear with HR, HPV typing done.  Uterus:   normal in size, shape and contour.  Midline and mobile  Adnexa/parametria:     Rt: Without masses or tenderness.   Lt: Without masses or tenderness.  Anus and perineum: Normal  Digital rectal exam: Normal sphincter tone without palpated masses or  tenderness  Assessment/Plan:  46 y.o. 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 Phineas Real after next cycle. SBE's, continue annual 3-D screening mammogram, history of dense breasts, increase regular exercise, calcium rich diet, vit.d 2000 u daily, Valtrex 500 mg BID for 3-5 days as needed, Pap with HR, HPV typing done. Refill of Xanax 0.25 uses sparingly one prescription per year refill given. Aware to use sparingly    Huel Cote [facility], 11:20 AM 03/28/2016

## 2016-03-28 NOTE — Telephone Encounter (Signed)
Pt was seen today for annual and said she didn't receive her Diflucan 150 mg tablet #30 Rx. Okay to send? Pt received this Rx last year as well.

## 2016-03-28 NOTE — Patient Instructions (Signed)
Health Maintenance, Female Adopting a healthy lifestyle and getting preventive care can go a long way to promote health and wellness. Talk with your health care provider about what schedule of regular examinations is right for you. This is a good chance for you to check in with your provider about disease prevention and staying healthy. In between checkups, there are plenty of things you can do on your own. Experts have done a lot of research about which lifestyle changes and preventive measures are most likely to keep you healthy. Ask your health care provider for more information. Weight and diet Eat a healthy diet  Be sure to include plenty of vegetables, fruits, low-fat dairy products, and lean protein.  Do not eat a lot of foods high in solid fats, added sugars, or salt.  Get regular exercise. This is one of the most important things you can do for your health.  Most adults should exercise for at least 150 minutes each week. The exercise should increase your heart rate and make you sweat (moderate-intensity exercise).  Most adults should also do strengthening exercises at least twice a week. This is in addition to the moderate-intensity exercise. Maintain a healthy weight  Body mass index (BMI) is a measurement that can be used to identify possible weight problems. It estimates body fat based on height and weight. Your health care provider can help determine your BMI and help you achieve or maintain a healthy weight.  For females 76 years of age and older:  A BMI below 18.5 is considered underweight.  A BMI of 18.5 to 24.9 is normal.  A BMI of 25 to 29.9 is considered overweight.  A BMI of 30 and above is considered obese. Watch levels of cholesterol and blood lipids  You should start having your blood tested for lipids and cholesterol at 47 years of age, then have this test every 5 years.  You may need to have your cholesterol levels checked more often if:  Your lipid or  cholesterol levels are high.  You are older than 47 years of age.  You are at high risk for heart disease. Cancer screening Lung Cancer  Lung cancer screening is recommended for adults 64-42 years old who are at high risk for lung cancer because of a history of smoking.  A yearly low-dose CT scan of the lungs is recommended for people who:  Currently smoke.  Have quit within the past 15 years.  Have at least a 30-pack-year history of smoking. A pack year is smoking an average of one pack of cigarettes a day for 1 year.  Yearly screening should continue until it has been 15 years since you quit.  Yearly screening should stop if you develop a health problem that would prevent you from having lung cancer treatment. Breast Cancer  Practice breast self-awareness. This means understanding how your breasts normally appear and feel.  It also means doing regular breast self-exams. Let your health care provider know about any changes, no matter how small.  If you are in your 20s or 30s, you should have a clinical breast exam (CBE) by a health care provider every 1-3 years as part of a regular health exam.  If you are 34 or older, have a CBE every year. Also consider having a breast X-ray (mammogram) every year.  If you have a family history of breast cancer, talk to your health care provider about genetic screening.  If you are at high risk for breast cancer, talk  to your health care provider about having an MRI and a mammogram every year.  Breast cancer gene (BRCA) assessment is recommended for women who have family members with BRCA-related cancers. BRCA-related cancers include:  Breast.  Ovarian.  Tubal.  Peritoneal cancers.  Results of the assessment will determine the need for genetic counseling and BRCA1 and BRCA2 testing. Cervical Cancer  Your health care provider may recommend that you be screened regularly for cancer of the pelvic organs (ovaries, uterus, and vagina).  This screening involves a pelvic examination, including checking for microscopic changes to the surface of your cervix (Pap test). You may be encouraged to have this screening done every 3 years, beginning at age 24.  For women ages 66-65, health care providers may recommend pelvic exams and Pap testing every 3 years, or they may recommend the Pap and pelvic exam, combined with testing for human papilloma virus (HPV), every 5 years. Some types of HPV increase your risk of cervical cancer. Testing for HPV may also be done on women of any age with unclear Pap test results.  Other health care providers may not recommend any screening for nonpregnant women who are considered low risk for pelvic cancer and who do not have symptoms. Ask your health care provider if a screening pelvic exam is right for you.  If you have had past treatment for cervical cancer or a condition that could lead to cancer, you need Pap tests and screening for cancer for at least 20 years after your treatment. If Pap tests have been discontinued, your risk factors (such as having a new sexual partner) need to be reassessed to determine if screening should resume. Some women have medical problems that increase the chance of getting cervical cancer. In these cases, your health care provider may recommend more frequent screening and Pap tests. Colorectal Cancer  This type of cancer can be detected and often prevented.  Routine colorectal cancer screening usually begins at 47 years of age and continues through 47 years of age.  Your health care provider may recommend screening at an earlier age if you have risk factors for colon cancer.  Your health care provider may also recommend using home test kits to check for hidden blood in the stool.  A small camera at the end of a tube can be used to examine your colon directly (sigmoidoscopy or colonoscopy). This is done to check for the earliest forms of colorectal cancer.  Routine  screening usually begins at age 41.  Direct examination of the colon should be repeated every 5-10 years through 47 years of age. However, you may need to be screened more often if early forms of precancerous polyps or small growths are found. Skin Cancer  Check your skin from head to toe regularly.  Tell your health care provider about any new moles or changes in moles, especially if there is a change in a mole's shape or color.  Also tell your health care provider if you have a mole that is larger than the size of a pencil eraser.  Always use sunscreen. Apply sunscreen liberally and repeatedly throughout the day.  Protect yourself by wearing long sleeves, pants, a wide-brimmed hat, and sunglasses whenever you are outside. Heart disease, diabetes, and high blood pressure  High blood pressure causes heart disease and increases the risk of stroke. High blood pressure is more likely to develop in:  People who have blood pressure in the high end of the normal range (130-139/85-89 mm Hg).  People who are overweight or obese.  People who are African American.  If you are 59-24 years of age, have your blood pressure checked every 3-5 years. If you are 34 years of age or older, have your blood pressure checked every year. You should have your blood pressure measured twice-once when you are at a hospital or clinic, and once when you are not at a hospital or clinic. Record the average of the two measurements. To check your blood pressure when you are not at a hospital or clinic, you can use:  An automated blood pressure machine at a pharmacy.  A home blood pressure monitor.  If you are between 29 years and 60 years old, ask your health care provider if you should take aspirin to prevent strokes.  Have regular diabetes screenings. This involves taking a blood sample to check your fasting blood sugar level.  If you are at a normal weight and have a low risk for diabetes, have this test once  every three years after 47 years of age.  If you are overweight and have a high risk for diabetes, consider being tested at a younger age or more often. Preventing infection Hepatitis B  If you have a higher risk for hepatitis B, you should be screened for this virus. You are considered at high risk for hepatitis B if:  You were born in a country where hepatitis B is common. Ask your health care provider which countries are considered high risk.  Your parents were born in a high-risk country, and you have not been immunized against hepatitis B (hepatitis B vaccine).  You have HIV or AIDS.  You use needles to inject street drugs.  You live with someone who has hepatitis B.  You have had sex with someone who has hepatitis B.  You get hemodialysis treatment.  You take certain medicines for conditions, including cancer, organ transplantation, and autoimmune conditions. Hepatitis C  Blood testing is recommended for:  Everyone born from [birth year] through 1966.  Anyone with known risk factors for hepatitis C. Sexually transmitted infections (STIs)  You should be screened for sexually transmitted infections (STIs) including gonorrhea and chlamydia if:  You are sexually active and are younger than 47 years of age.  You are older than 47 years of age and your health care provider tells you that you are at risk for this type of infection.  Your sexual activity has changed since you were last screened and you are at an increased risk for chlamydia or gonorrhea. Ask your health care provider if you are at risk.  If you do not have HIV, but are at risk, it may be recommended that you take a prescription medicine daily to prevent HIV infection. This is called pre-exposure prophylaxis (PrEP). You are considered at risk if:  You are sexually active and do not regularly use condoms or know the HIV status of your partner(s).  You take drugs by injection.  You are sexually active with a partner  who has HIV. Talk with your health care provider about whether you are at high risk of being infected with HIV. If you choose to begin PrEP, you should first be tested for HIV. You should then be tested every 3 months for as long as you are taking PrEP. Pregnancy  If you are premenopausal and you may become pregnant, ask your health care provider about preconception counseling.  If you may become pregnant, take 400 to 800 micrograms (mcg) of folic acid  every day.  If you want to prevent pregnancy, talk to your health care provider about birth control (contraception). Osteoporosis and menopause  Osteoporosis is a disease in which the bones lose minerals and strength with aging. This can result in serious bone fractures. Your risk for osteoporosis can be identified using a bone density scan.  If you are 45 years of age or older, or if you are at risk for osteoporosis and fractures, ask your health care provider if you should be screened.  Ask your health care provider whether you should take a calcium or vitamin D supplement to lower your risk for osteoporosis.  Menopause may have certain physical symptoms and risks.  Hormone replacement therapy may reduce some of these symptoms and risks. Talk to your health care provider about whether hormone replacement therapy is right for you. Follow these instructions at home:  Schedule regular health, dental, and eye exams.  Stay current with your immunizations.  Do not use any tobacco products including cigarettes, chewing tobacco, or electronic cigarettes.  If you are pregnant, do not drink alcohol.  If you are breastfeeding, limit how much and how often you drink alcohol.  Limit alcohol intake to no more than 1 drink per day for nonpregnant women. One drink equals 12 ounces of beer, 5 ounces of wine, or 1 ounces of hard liquor.  Do not use street drugs.  Do not share needles.  Ask your health care provider for help if you need support  or information about quitting drugs.  Tell your health care provider if you often feel depressed.  Tell your health care provider if you have ever been abused or do not feel safe at home. This information is not intended to replace advice given to you by your health care provider. Make sure you discuss any questions you have with your health care provider. Document Released: 07/22/2010 Document Revised: 06/14/2015 Document Reviewed: 10/10/2014 Elsevier Interactive Patient Education  2017 [place] DASH stands for "Dietary Approaches to Stop Hypertension." The DASH eating plan is a healthy eating plan that has been shown to reduce high blood pressure (hypertension). It may also reduce your risk for type 2 diabetes, heart disease, and stroke. The DASH eating plan may also help with weight loss. What are tips for following this plan? General guidelines   Avoid eating more than 2,300 mg (milligrams) of salt (sodium) a day. If you have hypertension, you may need to reduce your sodium intake to 1,500 mg a day.  Limit alcohol intake to no more than 1 drink a day for nonpregnant women and 2 drinks a day for men. One drink equals 12 oz of beer, 5 oz of wine, or 1 oz of hard liquor.  Work with your health care provider to maintain a healthy body weight or to lose weight. Ask what an ideal weight is for you.  Get at least 30 minutes of exercise that causes your heart to beat faster (aerobic exercise) most days of the week. Activities may include walking, swimming, or biking.  Work with your health care provider or diet and nutrition specialist (dietitian) to adjust your eating plan to your individual calorie needs. Reading food labels   Check food labels for the amount of sodium per serving. Choose foods with less than 5 percent of the Daily Value of sodium. Generally, foods with less than 300 mg of sodium per serving fit into this eating plan.  To find whole grains, look for  the  word "whole" as the first word in the ingredient list. Shopping   Buy products labeled as "low-sodium" or "no salt added."  Buy fresh foods. Avoid canned foods and premade or frozen meals. Cooking   Avoid adding salt when cooking. Use salt-free seasonings or herbs instead of table salt or sea salt. Check with your health care provider or pharmacist before using salt substitutes.  Do not fry foods. Cook foods using healthy methods such as baking, boiling, grilling, and broiling instead.  Cook with heart-healthy oils, such as olive, canola, soybean, or sunflower oil. Meal planning    Eat a balanced diet that includes:  5 or more servings of fruits and vegetables each day. At each meal, try to fill half of your plate with fruits and vegetables.  Up to 6-8 servings of whole grains each day.  Less than 6 oz of lean meat, poultry, or fish each day. A 3-oz serving of meat is about the same size as a deck of cards. One egg equals 1 oz.  2 servings of low-fat dairy each day.  A serving of nuts, seeds, or beans 5 times each week.  Heart-healthy fats. Healthy fats called Omega-3 fatty acids are found in foods such as flaxseeds and coldwater fish, like sardines, salmon, and mackerel.  Limit how much you eat of the following:  Canned or prepackaged foods.  Food that is high in trans fat, such as fried foods.  Food that is high in saturated fat, such as fatty meat.  Sweets, desserts, sugary drinks, and other foods with added sugar.  Full-fat dairy products.  Do not salt foods before eating.  Try to eat at least 2 vegetarian meals each week.  Eat more home-cooked food and less restaurant, buffet, and fast food.  When eating at a restaurant, ask that your food be prepared with less salt or no salt, if possible. What foods are recommended? The items listed may not be a complete list. Talk with your dietitian about what dietary choices are best for you. Grains  Whole-grain or  whole-wheat bread. Whole-grain or whole-wheat pasta. Brown rice. Oatmeal. Quinoa. Bulgur. Whole-grain and low-sodium cereals. Pita bread. Low-fat, low-sodium crackers. Whole-wheat flour tortillas. Vegetables  Fresh or frozen vegetables (raw, steamed, roasted, or grilled). Low-sodium or reduced-sodium tomato and vegetable juice. Low-sodium or reduced-sodium tomato sauce and tomato paste. Low-sodium or reduced-sodium canned vegetables. Fruits  All fresh, dried, or frozen fruit. Canned fruit in natural juice (without added sugar). Meat and other protein foods  Skinless chicken or turkey. Ground chicken or turkey. Pork with fat trimmed off. Fish and seafood. Egg whites. Dried beans, peas, or lentils. Unsalted nuts, nut butters, and seeds. Unsalted canned beans. Lean cuts of beef with fat trimmed off. Low-sodium, lean deli meat. Dairy  Low-fat (1%) or fat-free (skim) milk. Fat-free, low-fat, or reduced-fat cheeses. Nonfat, low-sodium ricotta or cottage cheese. Low-fat or nonfat yogurt. Low-fat, low-sodium cheese. Fats and oils  Soft margarine without trans fats. Vegetable oil. Low-fat, reduced-fat, or light mayonnaise and salad dressings (reduced-sodium). Canola, safflower, olive, soybean, and sunflower oils. Avocado. Seasoning and other foods  Herbs. Spices. Seasoning mixes without salt. Unsalted popcorn and pretzels. Fat-free sweets. What foods are not recommended? The items listed may not be a complete list. Talk with your dietitian about what dietary choices are best for you. Grains  Baked goods made with fat, such as croissants, muffins, or some breads. Dry pasta or rice meal packs. Vegetables  Creamed or fried vegetables. Vegetables in a cheese   sauce. Regular canned vegetables (not low-sodium or reduced-sodium). Regular canned tomato sauce and paste (not low-sodium or reduced-sodium). Regular tomato and vegetable juice (not low-sodium or reduced-sodium). Angie Fava. Olives. Fruits  Canned fruit in  a light or heavy syrup. Fried fruit. Fruit in cream or butter sauce. Meat and other protein foods  Fatty cuts of meat. Ribs. Fried meat. Berniece Salines. Sausage. Bologna and other processed lunch meats. Salami. Fatback. Hotdogs. Bratwurst. Salted nuts and seeds. Canned beans with added salt. Canned or smoked fish. Whole eggs or egg yolks. Chicken or [place] with skin. Dairy  Whole or 2% milk, cream, and half-and-half. Whole or full-fat cream cheese. Whole-fat or sweetened yogurt. Full-fat cheese. Nondairy creamers. Whipped toppings. Processed cheese and cheese spreads. Fats and oils  Butter. Stick margarine. Lard. Shortening. Ghee. Bacon fat. Tropical oils, such as coconut, palm kernel, or palm oil. Seasoning and other foods  Salted popcorn and pretzels. Onion salt, garlic salt, seasoned salt, table salt, and sea salt. Worcestershire sauce. Tartar sauce. Barbecue sauce. Teriyaki sauce. Soy sauce, including reduced-sodium. Steak sauce. Canned and packaged gravies. Fish sauce. Oyster sauce. Cocktail sauce. Horseradish that you find on the shelf. Ketchup. Mustard. Meat flavorings and tenderizers. Bouillon cubes. Hot sauce and Tabasco sauce. Premade or packaged marinades. Premade or packaged taco seasonings. Relishes. Regular salad dressings. Where to find more information:  National Heart, Lung, and [place]: https://wilson-eaton.com/  American Heart Association: www.heart.org Summary  The DASH eating plan is a healthy eating plan that has been shown to reduce high blood pressure (hypertension). It may also reduce your risk for type 2 diabetes, heart disease, and stroke.  With the DASH eating plan, you should limit salt (sodium) intake to 2,300 mg a day. If you have hypertension, you may need to reduce your sodium intake to 1,500 mg a day.  When on the DASH eating plan, aim to eat more fresh fruits and vegetables, whole grains, lean proteins, low-fat dairy, and heart-healthy fats.  Work with your health care  provider or diet and nutrition specialist (dietitian) to adjust your eating plan to your individual calorie needs. This information is not intended to replace advice given to you by your health care provider. Make sure you discuss any questions you have with your health care provider. Document Released: 12/26/2010 Document Revised: 12/31/2015 Document Reviewed: 12/31/2015 Elsevier Interactive Patient Education  2017 [employer].

## 2016-03-29 LAB — URINALYSIS W MICROSCOPIC + REFLEX CULTURE
BACTERIA UA: NONE SEEN [HPF]
BILIRUBIN URINE: NEGATIVE
CRYSTALS: NONE SEEN [HPF]
Casts: NONE SEEN [LPF]
GLUCOSE, UA: NEGATIVE
Hgb urine dipstick: NEGATIVE
KETONES UR: NEGATIVE
LEUKOCYTES UA: NEGATIVE
Nitrite: NEGATIVE
PROTEIN: NEGATIVE
RBC / HPF: NONE SEEN RBC/HPF (ref <=2)
SQUAMOUS EPITHELIAL / LPF: NONE SEEN [HPF] (ref <=5)
Specific Gravity, Urine: 1.018 (ref 1.001–1.035)
WBC UA: NONE SEEN WBC/HPF (ref <=5)
Yeast: NONE SEEN [HPF]
pH: 7 (ref 5.0–8.0)

## 2016-03-31 LAB — PAP, TP IMAGING W/ HPV RNA, RFLX HPV TYPE 16,18/45: HPV mRNA, High Risk: NOT DETECTED

## 2016-04-01 ENCOUNTER — Encounter: Payer: Self-pay | Admitting: Women's Health

## 2016-04-10 ENCOUNTER — Other Ambulatory Visit: Payer: Self-pay | Admitting: Gynecology

## 2016-04-10 DIAGNOSIS — N939 Abnormal uterine and vaginal bleeding, unspecified: Secondary | ICD-10-CM

## 2016-04-18 ENCOUNTER — Other Ambulatory Visit: Payer: Self-pay | Admitting: Gynecology

## 2016-04-18 DIAGNOSIS — Z1231 Encounter for screening mammogram for malignant neoplasm of breast: Secondary | ICD-10-CM

## 2016-04-21 ENCOUNTER — Telehealth: Payer: Self-pay | Admitting: Nurse Practitioner

## 2016-04-21 DIAGNOSIS — F411 Generalized anxiety disorder: Secondary | ICD-10-CM

## 2016-04-21 MED ORDER — ESCITALOPRAM OXALATE 20 MG PO TABS: 20.0000 mg | ORAL_TABLET | Freq: Every day | ORAL | 0 refills | Status: DC

## 2016-04-21 NOTE — Telephone Encounter (Signed)
done

## 2016-04-21 NOTE — Telephone Encounter (Signed)
What is the name of the medication? lexapro  Have you contacted your pharmacy to request a refill? No. Has no refills. She has appt on 4/20 and needs enough until then.   Which pharmacy would you like this sent to? [facility] on [address].   Patient notified that their request is being sent to the clinical staff for review and that they should receive a call once it is complete. If they do not receive a call within 24 hours they can check with their pharmacy or our office.

## 2016-04-23 ENCOUNTER — Ambulatory Visit: Payer: 59 | Admitting: Gynecology

## 2016-04-23 ENCOUNTER — Other Ambulatory Visit: Payer: 59

## 2016-04-24 ENCOUNTER — Ambulatory Visit (INDEPENDENT_AMBULATORY_CARE_PROVIDER_SITE_OTHER): Payer: 59

## 2016-04-24 ENCOUNTER — Ambulatory Visit (INDEPENDENT_AMBULATORY_CARE_PROVIDER_SITE_OTHER): Payer: 59 | Admitting: Gynecology

## 2016-04-24 ENCOUNTER — Encounter: Payer: Self-pay | Admitting: Gynecology

## 2016-04-24 VITALS — BP 110/70

## 2016-04-24 DIAGNOSIS — N939 Abnormal uterine and vaginal bleeding, unspecified: Secondary | ICD-10-CM

## 2016-04-24 DIAGNOSIS — N938 Other specified abnormal uterine and vaginal bleeding: Secondary | ICD-10-CM | POA: Diagnosis not present

## 2016-04-24 DIAGNOSIS — N920 Excessive and frequent menstruation with regular cycle: Secondary | ICD-10-CM

## 2016-04-24 NOTE — Patient Instructions (Signed)
Office we'll called with biopsy results

## 2016-04-24 NOTE — Progress Notes (Signed)
    Leslie Ritter 1970/09/21 [id number]        46 y.o.  G1P1 presents for sonohysterogram. Most recently saw Leslie Ritter with a history of several months of midcycle spotting. Otherwise having regular monthly menses. Tubal ligation birth control.  Past medical history,surgical history, problem list, medications, allergies, family history and social history were all reviewed and documented in the EPIC chart.  Directed ROS with pertinent positives and negatives documented in the history of present illness/assessment and plan.  Exam: [place] assistant Vitals:   04/24/16 1510  BP: 110/70   General appearance:  Normal Abdomen soft nontender without masses guarding rebound Pelvic external BUS vagina normal. Cervix normal. Uterus normal size anteverted midline mobile nontender. Adnexa without masses or tenderness.  Ultrasound transvaginal and transabdominal shows uterus normal size and echotexture. Endometrial echo 5.4 mm tri-layer. Right and left ovaries normal with physiologic changes. Cul-de-sac negative.  Sonohysterogram performed, sterile technique, easy catheter introduction, good distention with no abnormalities. Endometrial sample taken. Patient tolerated well.  Assessment/Plan:  46 y.o. G1P1 with several month history of intermenstrual spotting. Ultrasound is negative. Patient will follow up for biopsy results. Will keep menstrual calendar over the next several months and follow up if irregular spotting continues.    Leslie Ritter, 3:23 PM 04/24/2016

## 2016-05-06 ENCOUNTER — Ambulatory Visit
Admission: RE | Admit: 2016-05-06 | Discharge: 2016-05-06 | Disposition: A | Discharge status: Home / Self Care | Payer: 59 | Source: Ambulatory Visit | Attending: Gynecology | Admitting: Gynecology

## 2016-05-06 DIAGNOSIS — Z1231 Encounter for screening mammogram for malignant neoplasm of breast: Secondary | ICD-10-CM

## 2016-05-09 ENCOUNTER — Ambulatory Visit (INDEPENDENT_AMBULATORY_CARE_PROVIDER_SITE_OTHER): Payer: 59 | Admitting: Nurse Practitioner

## 2016-05-09 ENCOUNTER — Encounter: Payer: Self-pay | Admitting: Nurse Practitioner

## 2016-05-09 ENCOUNTER — Telehealth: Payer: Self-pay | Admitting: Family

## 2016-05-09 VITALS — BP 109/75 | HR 72 | Temp 98.0°F | Ht 61.0 in | Wt 144.0 lb

## 2016-05-09 DIAGNOSIS — I10 Essential (primary) hypertension: Secondary | ICD-10-CM

## 2016-05-09 DIAGNOSIS — F3342 Major depressive disorder, recurrent, in full remission: Secondary | ICD-10-CM

## 2016-05-09 DIAGNOSIS — F411 Generalized anxiety disorder: Secondary | ICD-10-CM

## 2016-05-09 MED ORDER — LISINOPRIL-HYDROCHLOROTHIAZIDE 20-12.5 MG PO TABS: 1.0000 | ORAL_TABLET | Freq: Every day | ORAL | 1 refills | Status: DC

## 2016-05-09 MED ORDER — ESCITALOPRAM OXALATE 20 MG PO TABS: 20.0000 mg | ORAL_TABLET | Freq: Every day | ORAL | 0 refills | Status: DC

## 2016-05-09 MED ORDER — CLONAZEPAM 0.5 MG PO TABS: 0.5000 mg | ORAL_TABLET | Freq: Two times a day (BID) | ORAL | 2 refills | Status: DC | PRN

## 2016-05-09 NOTE — Patient Instructions (Signed)
Stress and Stress Management Stress is a normal reaction to life events. It is what you feel when life demands more than you are used to or more than you can handle. Some stress can be useful. For example, the stress reaction can help you catch the last bus of the day, study for a test, or meet a deadline at work. But stress that occurs too often or for too long can cause problems. It can affect your emotional health and interfere with relationships and normal daily activities. Too much stress can weaken your immune system and increase your risk for physical illness. If you already have a medical problem, stress can make it worse. What are the causes? All sorts of life events may cause stress. An event that causes stress for one person may not be stressful for another person. Major life events commonly cause stress. These may be positive or negative. Examples include losing your job, moving into a new home, getting married, having a baby, or losing a loved one. Less obvious life events may also cause stress, especially if they occur day after day or in combination. Examples include working long hours, driving in traffic, caring for children, being in debt, or being in a difficult relationship. What are the signs or symptoms? Stress may cause emotional symptoms including, the following:  Anxiety. This is feeling worried, afraid, on edge, overwhelmed, or out of control.  Anger. This is feeling irritated or impatient.  Depression. This is feeling sad, down, helpless, or guilty.  Difficulty focusing, remembering, or making decisions. Stress may cause physical symptoms, including the following:  Aches and pains. These may affect your head, neck, back, stomach, or other areas of your body.  Tight muscles or clenched jaw.  Low energy or trouble sleeping. Stress may cause unhealthy behaviors, including the following:  Eating to feel better (overeating) or skipping meals.  Sleeping too little, too  much, or both.  Working too much or putting off tasks (procrastination).  Smoking, drinking alcohol, or using drugs to feel better. How is this diagnosed? Stress is diagnosed through an assessment by your health care provider. Your health care provider will ask questions about your symptoms and any stressful life events.Your health care provider will also ask about your medical history and may order blood tests or other tests. Certain medical conditions and medicine can cause physical symptoms similar to stress. Mental illness can cause emotional symptoms and unhealthy behaviors similar to stress. Your health care provider may refer you to a mental health professional for further evaluation. How is this treated? Stress management is the recommended treatment for stress.The goals of stress management are reducing stressful life events and coping with stress in healthy ways. Techniques for reducing stressful life events include the following:  Stress identification. Self-monitor for stress and identify what causes stress for you. These skills may help you to avoid some stressful events.  Time management. Set your priorities, keep a calendar of events, and learn to say "no." These tools can help you avoid making too many commitments. Techniques for coping with stress include the following:  Rethinking the problem. Try to think realistically about stressful events rather than ignoring them or overreacting. Try to find the positives in a stressful situation rather than focusing on the negatives.  Exercise. Physical exercise can release both physical and emotional tension. The key is to find a form of exercise you enjoy and do it regularly.  Relaxation techniques. These relax the body and mind. Examples include yoga,  meditation, tai chi, biofeedback, deep breathing, progressive muscle relaxation, listening to music, being out in nature, journaling, and other hobbies. Again, the key is to find one or  more that you enjoy and can do regularly.  Healthy lifestyle. Eat a balanced diet, get plenty of sleep, and do not smoke. Avoid using alcohol or drugs to relax.  Strong support network. Spend time with family, friends, or other people you enjoy being around.Express your feelings and talk things over with someone you trust. Counseling or talktherapy with a mental health professional may be helpful if you are having difficulty managing stress on your own. Medicine is typically not recommended for the treatment of stress.Talk to your health care provider if you think you need medicine for symptoms of stress. Follow these instructions at home:  Keep all follow-up visits as directed by your health care provider.  Take all medicines as directed by your health care provider. Contact a health care provider if:  Your symptoms get worse or you start having new symptoms.  You feel overwhelmed by your problems and can no longer manage them on your own. Get help right away if:  You feel like hurting yourself or someone else. This information is not intended to replace advice given to you by your health care provider. Make sure you discuss any questions you have with your health care provider. Document Released: 07/02/2000 Document Revised: 06/14/2015 Document Reviewed: 08/31/2012 Elsevier Interactive Patient Education  2017 [employer].

## 2016-05-09 NOTE — Progress Notes (Signed)
Subjective:    Patient ID: Leslie Ritter, female    DOB: July 03, 1970, 46 y.o.   MRN: [id number]  HPI  MARIANITA BOTKIN is here today for follow up of chronic medical problem.  Outpatient Encounter Prescriptions as of 05/09/2016  Medication Sig  . ALPRAZolam (XANAX) 0.25 MG tablet Take 1 tablet (0.25 mg total) by mouth at bedtime as needed for sleep.  Marland Kitchen escitalopram (LEXAPRO) 20 MG tablet Take 1 tablet (20 mg total) by mouth daily.  . fluconazole (DIFLUCAN) 100 MG tablet Take 1 tablet (100 mg total) by mouth daily. As needed  . lisinopril-hydrochlorothiazide (PRINZIDE,ZESTORETIC) 20-12.5 MG tablet Take 1 tablet by mouth daily.  . valACYclovir (VALTREX) 500 MG tablet Take 1 tablet (500 mg total) by mouth 2 (two) times daily.   No facility-administered encounter medications on file as of 05/09/2016.     1. Essential hypertension  No c/o HA,SOB,or chest pain- checksblood pressure occasionally at work- no problems  2. Recurrent major depressive disorder, in full remission ([place])  Current;y on lexapro- works well for her for depression but not helping with anxiety at all.-  No side effects Depression screen [facility] 2/9 05/09/2016 10/25/2015 04/27/2015 11/02/2014 06/09/2014  Decreased Interest 0 '1 2 1 ' 0  Down, Depressed, Hopeless 0 0 2 0 0  PHQ - 2 Score 0 '1 4 1 ' 0  Altered sleeping - - 1 - -  Tired, decreased energy - - 3 - -  Change in appetite - - 0 - -  Feeling bad or failure about yourself  - - 0 - -  Trouble concentrating - - 2 - -  Moving slowly or fidgety/restless - - 2 - -  Suicidal thoughts - - 0 - -  PHQ-9 Score - - 12 - -  Difficult doing work/chores - - Very difficult - -     3. GAD (generalized anxiety disorder)  Takes xanax on occasion GAD 7 : Generalized Anxiety Score 05/09/2016 10/25/2015 04/27/2015  Nervous, Anxious, on Edge '2 3 3  ' Control/stop worrying '2 2 3  ' Worry too much - different things '2 2 3  ' Trouble relaxing '1 2 3  ' Restless 0 2 3  Easily annoyed or irritable '2 2 3  ' Afraid  - awful might happen 2 1 0  Total GAD 7 Score '11 14 18  ' Anxiety Difficulty Very difficult Not difficult at all Very difficult          New complaints: None today     Review of Systems  Constitutional: Negative for diaphoresis.  Eyes: Negative for pain.  Respiratory: Negative for shortness of breath.   Cardiovascular: Negative for chest pain, palpitations and leg swelling.  Gastrointestinal: Negative for abdominal pain.  Endocrine: Negative for polydipsia.  Skin: Negative for rash.  Neurological: Negative for dizziness, weakness and headaches.  Hematological: Does not bruise/bleed easily.       Objective:   Physical Exam  Constitutional: She is oriented to person, place, and time. She appears well-developed and well-nourished.  HENT:  Nose: Nose normal.  Mouth/Throat: Oropharynx is clear and moist.  Eyes: EOM are normal.  Neck: Trachea normal, normal range of motion and full passive range of motion without pain. Neck supple. No JVD present. Carotid bruit is not present. No thyromegaly present.  Cardiovascular: Normal rate, regular rhythm, normal heart sounds and intact distal pulses.  Exam reveals no gallop and no friction rub.   No murmur heard. Pulmonary/Chest: Effort normal and breath sounds normal.  Abdominal: Soft. Bowel  sounds are normal. She exhibits no distension and no mass. There is no tenderness.  Musculoskeletal: Normal range of motion.  Lymphadenopathy:    She has no cervical adenopathy.  Neurological: She is alert and oriented to person, place, and time. She has normal reflexes.  Skin: Skin is warm and dry.  Psychiatric: She has a normal mood and affect. Her behavior is normal. Judgment and thought content normal.   BP 109/75   Pulse 72   Temp 98 F (36.7 C) (Oral)   Ht '5\' 1"'  (1.549 m)   Wt 144 lb (65.3 kg)   LMP 05/04/2016   BMI 27.21 kg/m         Assessment & Plan:  1. Essential hypertension Low sodium diet - CMP14+EGFR -  lisinopril-hydrochlorothiazide (PRINZIDE,ZESTORETIC) 20-12.5 MG tablet; Take 1 tablet by mouth daily.  Dispense: 90 tablet; Refill: 1  2. Recurrent major depressive disorder, in full remission ([place]) Stress management Continue lexapro as rx  3. GAD (generalized anxiety disorder) cahnged form xanax to klonopin- take prn - clonazePAM (KLONOPIN) 0.5 MG tablet; Take 1 tablet (0.5 mg total) by mouth 2 (two) times daily as needed for anxiety.  Dispense: 60 tablet; Refill: 2 - escitalopram (LEXAPRO) 20 MG tablet; Take 1 tablet (20 mg total) by mouth daily.  Dispense: 30 tablet; Refill: 0    Labs pending Health maintenance reviewed Diet and exercise encouraged Continue all meds Follow up  In 3 months  [place], FNP

## 2016-05-10 LAB — CMP14+EGFR
ALT: 11 IU/L (ref 0–32)
AST: 13 IU/L (ref 0–40)
Albumin/Globulin Ratio: 2 (ref 1.2–2.2)
Albumin: 4.6 g/dL (ref 3.5–5.5)
Alkaline Phosphatase: 45 IU/L (ref 39–117)
BUN/Creatinine Ratio: 11 (ref 9–23)
BUN: 10 mg/dL (ref 6–24)
Bilirubin Total: 0.6 mg/dL (ref 0.0–1.2)
CO2: 26 mmol/L (ref 18–29)
Calcium: 9.4 mg/dL (ref 8.7–10.2)
Chloride: 99 mmol/L (ref 96–106)
Creatinine, Ser: 0.88 mg/dL (ref 0.57–1.00)
GFR calc Af Amer: 91 mL/min/{1.73_m2} (ref >59)
GFR, EST NON AFRICAN AMERICAN: 79 mL/min/{1.73_m2} (ref >59)
GLOBULIN, TOTAL: 2.3 g/dL (ref 1.5–4.5)
Glucose: 101 mg/dL — ABNORMAL HIGH (ref 65–99)
Potassium: 3.9 mmol/L (ref 3.5–5.2)
SODIUM: 138 mmol/L (ref 134–144)
Total Protein: 6.9 g/dL (ref 6.0–8.5)

## 2016-06-04 ENCOUNTER — Encounter: Payer: Self-pay | Admitting: Gynecology

## 2016-06-25 ENCOUNTER — Other Ambulatory Visit: Payer: Self-pay | Admitting: Nurse Practitioner

## 2016-06-25 DIAGNOSIS — F411 Generalized anxiety disorder: Secondary | ICD-10-CM

## 2016-06-30 ENCOUNTER — Ambulatory Visit (INDEPENDENT_AMBULATORY_CARE_PROVIDER_SITE_OTHER): Payer: 59 | Admitting: Family Medicine

## 2016-06-30 ENCOUNTER — Encounter: Payer: Self-pay | Admitting: Family Medicine

## 2016-06-30 VITALS — BP 104/72 | HR 63 | Temp 97.8°F | Ht 61.0 in | Wt 141.0 lb

## 2016-06-30 DIAGNOSIS — L237 Allergic contact dermatitis due to plants, except food: Secondary | ICD-10-CM | POA: Diagnosis not present

## 2016-06-30 MED ORDER — METHYLPREDNISOLONE ACETATE 80 MG/ML IJ SUSP
80.0000 mg | Freq: Once | INTRAMUSCULAR | Status: AC
  Administered 2016-06-30: 80 mg via INTRAMUSCULAR

## 2016-06-30 MED ORDER — PREDNISONE 20 MG PO TABS: ORAL_TABLET | ORAL | 0 refills | Status: DC

## 2016-06-30 NOTE — Progress Notes (Signed)
BP 104/72 (BP Location: Left Arm)   Pulse 63   Temp 97.8 F (36.6 C) (Oral)   Ht 5\' 1"  (1.549 m)   Wt 141 lb (64 kg)   LMP 06/16/2016   BMI 26.64 kg/m    Subjective:    Patient ID: Leslie Ritter, female    DOB: May 29, 1970, 46 y.o.   MRN: [id number]  HPI: Leslie Ritter is a 46 y.o. female presenting on 06/30/2016 for Poison Ivy (rash - noticed friday)   HPI Rash on buttocks Patient has developed a rash that started on her buttocks and then has spread to both cheeks and she has a few spots on her anterior forearms. She does not know where she could've been exposed but it is very pruritic and it looks like what she had before of poison ivy or poison oak exposure. When talking with possible exposure she does have pets they do sleep in her bed sometime and she does sometimes sleep without underwear while she sleeps. She denies any fevers or chills or redness or drainage but just has is very pruritic rash that she just cannot stand it any further.  Relevant past medical, surgical, family and social history reviewed and updated as indicated. Interim medical history since our last visit reviewed. Allergies and medications reviewed and updated.  Review of Systems  Constitutional: Negative for chills and fever.  Respiratory: Negative for chest tightness and shortness of breath.   Cardiovascular: Negative for chest pain and leg swelling.  Musculoskeletal: Negative for back pain and gait problem.  Skin: Positive for rash.  Neurological: Negative for light-headedness and headaches.  Psychiatric/Behavioral: Negative for agitation and behavioral problems.  All other systems reviewed and are negative.   Per HPI unless specifically indicated above        Objective:    BP 104/72 (BP Location: Left Arm)   Pulse 63   Temp 97.8 F (36.6 C) (Oral)   Ht 5\' 1"  (1.549 m)   Wt 141 lb (64 kg)   LMP 06/16/2016   BMI 26.64 kg/m   Wt Readings from Last 3 Encounters:  06/30/16 141 lb (64 kg)    05/09/16 144 lb (65.3 kg)  03/28/16 140 lb (63.5 kg)    Physical Exam  Constitutional: She is oriented to person, place, and time. She appears well-developed and well-nourished. No distress.  Eyes: Conjunctivae are normal.  Cardiovascular: Normal rate, regular rhythm, normal heart sounds and intact distal pulses.   No murmur heard. Pulmonary/Chest: Effort normal and breath sounds normal. No respiratory distress. She has no wheezes. She has no rales.  Musculoskeletal: Normal range of motion.  Neurological: She is alert and oriented to person, place, and time.  Skin: Skin is warm and dry. Rash noted. Rash is maculopapular (Maculopapular rash with excoriations covering both buttocks and a few small patches on anterior forearms). She is not diaphoretic.  Psychiatric: She has a normal mood and affect. Her behavior is normal.  Nursing note and vitals reviewed.     Assessment & Plan:   Problem List Items Addressed This Visit    None    Visit Diagnoses    Poison oak dermatitis    -  Primary   On buttocks and a little bit on forearms   Relevant Medications   methylPREDNISolone acetate (DEPO-MEDROL) injection 80 mg (Start on 06/30/2016  6:30 PM)   predniSONE (DELTASONE) 20 MG tablet       Follow up plan: Return if symptoms worsen or fail  to improve.  Counseling provided for all of the vaccine components No orders of the defined types were placed in this encounter.   Caryl Pina, MD [facility] 06/30/2016, 6:23 PM

## 2016-07-04 ENCOUNTER — Encounter: Payer: Self-pay | Admitting: Family Medicine

## 2016-07-04 ENCOUNTER — Ambulatory Visit (INDEPENDENT_AMBULATORY_CARE_PROVIDER_SITE_OTHER): Payer: 59 | Admitting: Family Medicine

## 2016-07-04 DIAGNOSIS — L237 Allergic contact dermatitis due to plants, except food: Secondary | ICD-10-CM

## 2016-07-04 MED ORDER — PREDNISONE 20 MG PO TABS: ORAL_TABLET | ORAL | 0 refills | Status: DC

## 2016-07-04 MED ORDER — HALOBETASOL PROPIONATE 0.05 % EX CREA: TOPICAL_CREAM | Freq: Two times a day (BID) | CUTANEOUS | 0 refills | Status: DC

## 2016-07-04 MED ORDER — BETAMETHASONE SOD PHOS & ACET 6 (3-3) MG/ML IJ SUSP
6.0000 mg | Freq: Once | INTRAMUSCULAR | Status: AC
  Administered 2016-07-04: 6 mg via INTRAMUSCULAR

## 2016-07-04 NOTE — Addendum Note (Signed)
Addended by: Rolena Infante on: 07/04/2016 12:09 PM   Modules accepted: Orders

## 2016-07-04 NOTE — Progress Notes (Signed)
Chief Complaint  Patient presents with  . Poison Oak    Seen Monday for same thing and was given meds and shot    HPI  Patient presents today for States she has had no relief at all from the previous treatment. The rash has not spread. However, it remains pruritic. She thinks she picked up the inflammation when about a week ago she had to go to the bathroom outdoors at night and she while squatting to urinate lost her balance and fell sideways. She started having the rash the next day.  PMH: Smoking status noted ROS: Per HPI  Objective: BP 111/76   Pulse 70   Temp 97.6 F (36.4 C) (Oral)   Ht 5\' 1"  (1.549 m)   Wt 141 lb (64 kg)   LMP 06/16/2016   BMI 26.64 kg/m  Gen: NAD, alert, cooperative with exam HEENT: NCAT, EOMI Skin: There is marked erythema covering much of the buttocks bilaterally. Few scattered vesicles. No dermatomal distribution. There are a few erythematous vesicular eruptions noted on the right posterior arm and left anterior forearm. These are scant. Resp: CTABL, Ext: No edema, warm Neuro: Alert and oriented, No gross deficits  Assessment and plan:  1. Poison oak dermatitis     Meds ordered this encounter  Medications  . predniSONE (DELTASONE) 20 MG tablet    Sig: 2 po at same time daily for 5 days    Dispense:  10 tablet    Refill:  0  . betamethasone acetate-betamethasone sodium phosphate (CELESTONE) injection 6 mg  . halobetasol (ULTRAVATE) 0.05 % cream    Sig: Apply topically 2 (two) times daily.    Dispense:  50 g    Refill:  0    No orders of the defined types were placed in this encounter.   Follow up as needed.  Claretta Fraise, MD

## 2016-07-04 NOTE — Patient Instructions (Signed)
In addition to the shot and pills I have prescribed a cream he comply twice daily as needed.  You should also sit for about 10 minutes to 15 minutes up to 4 times daily in a bathtub with Aveeno colloidal oatmeal bath  according to package directions

## 2016-07-08 ENCOUNTER — Encounter: Payer: Self-pay | Admitting: Family

## 2016-07-08 ENCOUNTER — Ambulatory Visit (INDEPENDENT_AMBULATORY_CARE_PROVIDER_SITE_OTHER): Payer: 59 | Admitting: Family

## 2016-07-08 VITALS — BP 118/81 | HR 63 | Temp 98.7°F | Ht 61.0 in | Wt 140.0 lb

## 2016-07-08 DIAGNOSIS — L255 Unspecified contact dermatitis due to plants, except food: Secondary | ICD-10-CM | POA: Diagnosis not present

## 2016-07-08 MED ORDER — METHYLPREDNISOLONE ACETATE 80 MG/ML IJ SUSP
80.0000 mg | Freq: Once | INTRAMUSCULAR | Status: AC
  Administered 2016-07-08: 80 mg via INTRAMUSCULAR

## 2016-07-08 MED ORDER — HALOBETASOL PROPIONATE 0.05 % EX CREA: TOPICAL_CREAM | Freq: Two times a day (BID) | CUTANEOUS | 1 refills | Status: DC

## 2016-07-08 NOTE — Progress Notes (Signed)
   Subjective:    Patient ID: Leslie Ritter, female    DOB: 04/08/1970, 46 y.o.   MRN: [id number]  PT presents to the office today with recurrent contact dermatitis from poison oak. Pt has been treated with celestone injection, Depo-medrol 80 mg injection, halobetasol cream BID, prednisone 40 mg for 5 days.  PT states she still have 2 more days of the prednisone oral. Pt states her rash is slightly improved, but continues to be "miserable". Rash  This is a recurrent problem. The current episode started 1 to 4 weeks ago. The problem has been waxing and waning since onset. The affected locations include the left arm, right arm, back, right buttock and left buttock. The rash is characterized by redness. She was exposed to plant contact.      Review of Systems  Skin: Positive for rash.  All other systems reviewed and are negative.      Objective:   Physical Exam  Constitutional: She is oriented to person, place, and time. She appears well-developed and well-nourished. No distress.  HENT:  Head: Normocephalic and atraumatic.  Cardiovascular: Normal rate, regular rhythm, normal heart sounds and intact distal pulses.   No murmur heard. Pulmonary/Chest: Effort normal and breath sounds normal. No respiratory distress. She has no wheezes.  Musculoskeletal: Normal range of motion. She exhibits no edema or tenderness.  Neurological: She is alert and oriented to person, place, and time.  Skin: Skin is warm and dry. Rash noted.  Erythemas blister  rash on bilateral buttocks left posterior arm, several lesions opened  Psychiatric: She has a normal mood and affect. Her behavior is normal. Judgment and thought content normal.  Vitals reviewed.       BP 118/81   Pulse 63   Temp 98.7 F (37.1 C) (Oral)   Ht 5\' 1"  (1.549 m)   Wt 140 lb (63.5 kg)   LMP 06/16/2016   BMI 26.45 kg/m      Assessment & Plan:  1. Contact dermatitis due to plant Do not scratch Report any s/s of infection -Wear  protective clothing while outside- Long sleeves and long pants -Take a shower as soon as possible after being outside RTO prn  - methylPREDNISolone acetate (DEPO-MEDROL) injection 80 mg; Inject 1 mL (80 mg total) into the muscle once. - halobetasol (ULTRAVATE) 0.05 % cream; Apply topically 2 (two) times daily.  Dispense: 200 g; Refill: [place], FNP

## 2016-07-08 NOTE — Patient Instructions (Addendum)
Poison Oak Dermatitis  Poison oak dermatitis is inflammation of the skin that is caused by contact with the allergens on the leaves of the poison oak (toxicodendron) plant. The skin reaction often includes redness, swelling, blisters, and extreme itching.  What are the causes?  This condition is caused by a specific chemical (urushiol) that is found in the sap of the poison oak plant. This chemical is sticky and it can be easily spread to people, animals, and objects. You can get poison oak dermatitis by:  · Having direct contact with a poison oak plant.  · Touching animals, other people, or objects that have come in contact with poison oak and have the chemical on them.    What increases the risk?  This condition is more likely to develop in people who:  · Are outdoors often.  · Go outdoors without wearing protective clothing, such as closed shoes, long pants, and a long-sleeved shirt.    What are the signs or symptoms?  Symptoms of this condition include:  · Redness of the skin.  · A rash that may develop blisters.  · Extreme itching.  · Swelling. This may occur if the reaction is more severe.    Symptoms usually last for 1-2 weeks. However, the first time you develop this condition, symptoms may last 3-4 weeks.  How is this diagnosed?  This condition may be diagnosed based on your symptoms and a physical exam. Your health care provider may also ask you about any recent outdoor activity.  How is this treated?  Treatment for this condition will vary depending on how severe it is. Treatment may include:  · Hydrocortisone creams or calamine lotions to relieve itching.  · Oatmeal baths to soothe the skin.  · Over-the-counter antihistamine tablets.  · Oral steroid medicine for more severe outbreaks.    Follow these instructions at home:  · Take or apply over-the-counter and prescription medicines only as told by your health care provider.  · Wash exposed skin as soon as possible with soap and cold water.  · Use  hydrocortisone creams or calamine lotion as needed to soothe the skin and relieve itching.  · Take oatmeal baths as needed. Use colloidal oatmeal. You can get this at your local pharmacy or grocery store. Follow the instructions on the packaging.  · Do not scratch or rub your skin.  · While you have the rash, wash clothes right after you wear them.  How is this prevented?  · Learn to identify the poison oak plant and avoid contact with the plant. This plant can be recognized by the number of leaves. Generally, poison oak has three leaves with flowering branches on a single stem. The leaves are often a bit fuzzy and have a toothlike edge.  · If you have been exposed to poison oak, thoroughly wash with soap and water right away. You have about 30 minutes to remove the plant resin before it will cause the rash. Be sure to wash under your fingernails because any plant resin there will continue to spread the rash.  · When hiking or camping, wear clothes that will help you avoid exposure on the skin. This includes long pants, a long-sleeved shirt, tall socks, and hiking boots. You can also apply preventive lotion to your skin to help limit exposure.  · If you suspect that your clothes or outdoor gear came in contact with poison oak, rinse them off outside with a garden hose before bringing them inside your house.    Contact a health care provider if:  · You have open sores in the rash area.  · You have more redness, swelling, or pain in the affected area.  · You have redness that spreads beyond the rash area.  · You have fluid, blood, or pus coming from the affected area.  · You have a fever.  · You have a rash over a large area of your body.  · You have a rash on your eyes, mouth, or genitals.  · Your rash does not improve after a few days.  Get help right away if:  · Your face swells or your eyes swell shut.  · You have trouble breathing.  · You have trouble swallowing.  This information is not intended to replace advice  given to you by your health care provider. Make sure you discuss any questions you have with your health care provider.  Document Released: 07/13/2002 Document Revised: 06/14/2015 Document Reviewed: 06/14/2014  Elsevier Interactive Patient Education © 2018 [employer]

## 2016-08-21 ENCOUNTER — Other Ambulatory Visit: Payer: Self-pay | Admitting: Nurse Practitioner

## 2016-08-21 DIAGNOSIS — F411 Generalized anxiety disorder: Secondary | ICD-10-CM

## 2016-10-07 ENCOUNTER — Telehealth: Payer: Self-pay | Admitting: Nurse Practitioner

## 2016-10-07 DIAGNOSIS — F411 Generalized anxiety disorder: Secondary | ICD-10-CM

## 2016-10-07 MED ORDER — ESCITALOPRAM OXALATE 20 MG PO TABS: 20.0000 mg | ORAL_TABLET | Freq: Every day | ORAL | 0 refills | Status: DC

## 2016-10-07 NOTE — Telephone Encounter (Signed)
RX sent into pt's pharmacy per request Okayed per MMM

## 2016-11-24 ENCOUNTER — Encounter: Payer: Self-pay | Admitting: Nurse Practitioner

## 2016-11-24 ENCOUNTER — Ambulatory Visit (INDEPENDENT_AMBULATORY_CARE_PROVIDER_SITE_OTHER): Payer: 59 | Admitting: Nurse Practitioner

## 2016-11-24 VITALS — BP 113/84 | HR 73 | Temp 98.6°F | Ht 61.0 in | Wt 141.0 lb

## 2016-11-24 DIAGNOSIS — F411 Generalized anxiety disorder: Secondary | ICD-10-CM | POA: Diagnosis not present

## 2016-11-24 DIAGNOSIS — I10 Essential (primary) hypertension: Secondary | ICD-10-CM | POA: Diagnosis not present

## 2016-11-24 DIAGNOSIS — F3342 Major depressive disorder, recurrent, in full remission: Secondary | ICD-10-CM

## 2016-11-24 MED ORDER — LISINOPRIL-HYDROCHLOROTHIAZIDE 20-12.5 MG PO TABS: 1.0000 | ORAL_TABLET | Freq: Every day | ORAL | 1 refills | Status: DC

## 2016-11-24 MED ORDER — ESCITALOPRAM OXALATE 20 MG PO TABS: 20.0000 mg | ORAL_TABLET | Freq: Every day | ORAL | 0 refills | Status: DC

## 2016-11-24 MED ORDER — CLONAZEPAM 0.5 MG PO TABS: 0.5000 mg | ORAL_TABLET | Freq: Two times a day (BID) | ORAL | 2 refills | Status: DC | PRN

## 2016-11-24 NOTE — Progress Notes (Signed)
Subjective:    Patient ID: Leslie Ritter, female    DOB: 12-16-1970, 46 y.o.   MRN: [id number]  HPI   Leslie Ritter is here today for follow up of chronic medical problem.  Outpatient Encounter Medications as of 11/24/2016  Medication Sig  . clonazePAM (KLONOPIN) 0.5 MG tablet Take 1 tablet (0.5 mg total) by mouth 2 (two) times daily as needed for anxiety.  Marland Kitchen escitalopram (LEXAPRO) 20 MG tablet Take 1 tablet (20 mg total) by mouth daily.  . fluconazole (DIFLUCAN) 100 MG tablet Take 1 tablet (100 mg total) by mouth daily. As needed  . lisinopril-hydrochlorothiazide (PRINZIDE,ZESTORETIC) 20-12.5 MG tablet Take 1 tablet by mouth daily.  . valACYclovir (VALTREX) 500 MG tablet Take 1 tablet (500 mg total) by mouth 2 (two) times daily.    1. Essential hypertension  No c/o chest pain, SOB or headache. Occasionally checks blood pressure at work and is normal. BP Readings from Last 3 Encounters:  07/08/16 118/81  07/04/16 111/76  06/30/16 104/72     2. GAD (generalized anxiety disorder)  She is on klonopin. Bid- she has always been a nervous person. Some days she only need sto take one a day.  3. Recurrent major depressive disorder, in full remission ([place])  In on lexapro daily and is working well to keep her from feeling down.  Depression screen [facility] 2/9 11/24/2016 07/08/2016 07/04/2016  Decreased Interest 0 0 0  Down, Depressed, Hopeless 0 0 0  PHQ - 2 Score 0 0 0  Altered sleeping - - -  Tired, decreased energy - - -  Change in appetite - - -  Feeling bad or failure about yourself  - - -  Trouble concentrating - - -  Moving slowly or fidgety/restless - - -  Suicidal thoughts - - -  PHQ-9 Score - - -  Difficult doing work/chores - - -       New complaints: None today  Social history: Works at an [employer], [employer]   Review of Systems  Constitutional: Negative for activity change and appetite change.  HENT: Negative.   Eyes: Negative for pain.  Respiratory: Negative for  shortness of breath.   Cardiovascular: Negative for chest pain, palpitations and leg swelling.  Gastrointestinal: Negative for abdominal pain.  Endocrine: Negative for polydipsia.  Genitourinary: Negative.   Skin: Negative for rash.  Neurological: Negative for dizziness, weakness and headaches.  Hematological: Does not bruise/bleed easily.  Psychiatric/Behavioral: Negative.   All other systems reviewed and are negative.      Objective:   Physical Exam  Constitutional: She is oriented to person, place, and time. She appears well-developed and well-nourished.  HENT:  Nose: Nose normal.  Mouth/Throat: Oropharynx is clear and moist.  Eyes: EOM are normal.  Neck: Trachea normal, normal range of motion and full passive range of motion without pain. Neck supple. No JVD present. Carotid bruit is not present. No thyromegaly present.  Cardiovascular: Normal rate, regular rhythm, normal heart sounds and intact distal pulses. Exam reveals no gallop and no friction rub.  No murmur heard. Pulmonary/Chest: Effort normal and breath sounds normal.  Abdominal: Soft. Bowel sounds are normal. She exhibits no distension and no mass. There is no tenderness.  Musculoskeletal: Normal range of motion.  Lymphadenopathy:    She has no cervical adenopathy.  Neurological: She is alert and oriented to person, place, and time. She has normal reflexes.  Skin: Skin is warm and dry.  Psychiatric: She has a normal mood  and affect. Her behavior is normal. Judgment and thought content normal.   BP 113/84   Pulse 73   Temp 98.6 F (37 C) (Oral)   Ht '5\' 1"'  (1.549 m)   Wt 141 lb (64 kg)   BMI 26.64 kg/m       Assessment & Plan:  1. Essential hypertension Low sodium diet - lisinopril-hydrochlorothiazide (PRINZIDE,ZESTORETIC) 20-12.5 MG tablet; Take 1 tablet daily by mouth.  Dispense: 90 tablet; Refill: 1 - CMP14+EGFR - Lipid panel  2. GAD (generalized anxiety disorder) 3. Recurrent major depressive  disorder, in full remission ([place]) Stress management - clonazePAM (KLONOPIN) 0.5 MG tablet; Take 1 tablet (0.5 mg total) 2 (two) times daily as needed by mouth for anxiety.  Dispense: 60 tablet; Refill: 2 - escitalopram (LEXAPRO) 20 MG tablet; Take 1 tablet (20 mg total) daily by mouth.  Dispense: 30 tablet; Refill: 0     Labs pending Health maintenance reviewed Diet and exercise encouraged Continue all meds Follow up  In 6 months   [place], FNP

## 2016-11-24 NOTE — Patient Instructions (Signed)
Stress and Stress Management Stress is a normal reaction to life events. It is what you feel when life demands more than you are used to or more than you can handle. Some stress can be useful. For example, the stress reaction can help you catch the last bus of the day, study for a test, or meet a deadline at work. But stress that occurs too often or for too long can cause problems. It can affect your emotional health and interfere with relationships and normal daily activities. Too much stress can weaken your immune system and increase your risk for physical illness. If you already have a medical problem, stress can make it worse. What are the causes? All sorts of life events may cause stress. An event that causes stress for one person may not be stressful for another person. Major life events commonly cause stress. These may be positive or negative. Examples include losing your job, moving into a new home, getting married, having a baby, or losing a loved one. Less obvious life events may also cause stress, especially if they occur day after day or in combination. Examples include working long hours, driving in traffic, caring for children, being in debt, or being in a difficult relationship. What are the signs or symptoms? Stress may cause emotional symptoms including, the following:  Anxiety. This is feeling worried, afraid, on edge, overwhelmed, or out of control.  Anger. This is feeling irritated or impatient.  Depression. This is feeling sad, down, helpless, or guilty.  Difficulty focusing, remembering, or making decisions.  Stress may cause physical symptoms, including the following:  Aches and pains. These may affect your head, neck, back, stomach, or other areas of your body.  Tight muscles or clenched jaw.  Low energy or trouble sleeping.  Stress may cause unhealthy behaviors, including the following:  Eating to feel better (overeating) or skipping meals.  Sleeping too little,  too much, or both.  Working too much or putting off tasks (procrastination).  Smoking, drinking alcohol, or using drugs to feel better.  How is this diagnosed? Stress is diagnosed through an assessment by your health care provider. Your health care provider will ask questions about your symptoms and any stressful life events.Your health care provider will also ask about your medical history and may order blood tests or other tests. Certain medical conditions and medicine can cause physical symptoms similar to stress. Mental illness can cause emotional symptoms and unhealthy behaviors similar to stress. Your health care provider may refer you to a mental health professional for further evaluation. How is this treated? Stress management is the recommended treatment for stress.The goals of stress management are reducing stressful life events and coping with stress in healthy ways. Techniques for reducing stressful life events include the following:  Stress identification. Self-monitor for stress and identify what causes stress for you. These skills may help you to avoid some stressful events.  Time management. Set your priorities, keep a calendar of events, and learn to say "no." These tools can help you avoid making too many commitments.  Techniques for coping with stress include the following:  Rethinking the problem. Try to think realistically about stressful events rather than ignoring them or overreacting. Try to find the positives in a stressful situation rather than focusing on the negatives.  Exercise. Physical exercise can release both physical and emotional tension. The key is to find a form of exercise you enjoy and do it regularly.  Relaxation techniques. These relax the body and  mind. Examples include yoga, meditation, tai chi, biofeedback, deep breathing, progressive muscle relaxation, listening to music, being out in nature, journaling, and other hobbies. Again, the key is to find  one or more that you enjoy and can do regularly.  Healthy lifestyle. Eat a balanced diet, get plenty of sleep, and do not smoke. Avoid using alcohol or drugs to relax.  Strong support network. Spend time with family, friends, or other people you enjoy being around.Express your feelings and talk things over with someone you trust.  Counseling or talktherapy with a mental health professional may be helpful if you are having difficulty managing stress on your own. Medicine is typically not recommended for the treatment of stress.Talk to your health care provider if you think you need medicine for symptoms of stress. Follow these instructions at home:  Keep all follow-up visits as directed by your health care provider.  Take all medicines as directed by your health care provider. Contact a health care provider if:  Your symptoms get worse or you start having new symptoms.  You feel overwhelmed by your problems and can no longer manage them on your own. Get help right away if:  You feel like hurting yourself or someone else. This information is not intended to replace advice given to you by your health care provider. Make sure you discuss any questions you have with your health care provider. Document Released: 07/02/2000 Document Revised: 06/14/2015 Document Reviewed: 08/31/2012 Elsevier Interactive Patient Education  2017 Elsevier Inc.  

## 2016-11-25 LAB — CMP14+EGFR
A/G RATIO: 2 (ref 1.2–2.2)
ALK PHOS: 46 IU/L (ref 39–117)
ALT: 15 IU/L (ref 0–32)
AST: 13 IU/L (ref 0–40)
Albumin: 4.8 g/dL (ref 3.5–5.5)
BILIRUBIN TOTAL: 0.7 mg/dL (ref 0.0–1.2)
BUN/Creatinine Ratio: 18 (ref 9–23)
BUN: 14 mg/dL (ref 6–24)
CO2: 25 mmol/L (ref 20–29)
Calcium: 9.6 mg/dL (ref 8.7–10.2)
Chloride: 100 mmol/L (ref 96–106)
Creatinine, Ser: 0.77 mg/dL (ref 0.57–1.00)
GFR calc Af Amer: 107 mL/min/{1.73_m2} (ref >59)
GFR calc non Af Amer: 93 mL/min/{1.73_m2} (ref >59)
GLOBULIN, TOTAL: 2.4 g/dL (ref 1.5–4.5)
GLUCOSE: 95 mg/dL (ref 65–99)
POTASSIUM: 4.1 mmol/L (ref 3.5–5.2)
SODIUM: 140 mmol/L (ref 134–144)
Total Protein: 7.2 g/dL (ref 6.0–8.5)

## 2016-11-25 LAB — LIPID PANEL
Chol/HDL Ratio: 1.9 ratio (ref 0.0–4.4)
Cholesterol, Total: 151 mg/dL (ref 100–199)
HDL: 81 mg/dL (ref >39)
LDL Calculated: 60 mg/dL (ref 0–99)
TRIGLYCERIDES: 49 mg/dL (ref 0–149)
VLDL Cholesterol Cal: 10 mg/dL (ref 5–40)

## 2016-12-25 ENCOUNTER — Other Ambulatory Visit: Payer: Self-pay | Admitting: Nurse Practitioner

## 2016-12-25 DIAGNOSIS — F411 Generalized anxiety disorder: Secondary | ICD-10-CM

## 2017-03-03 ENCOUNTER — Other Ambulatory Visit: Payer: Self-pay | Admitting: Nurse Practitioner

## 2017-03-03 DIAGNOSIS — F411 Generalized anxiety disorder: Secondary | ICD-10-CM

## 2017-03-03 MED ORDER — ESCITALOPRAM OXALATE 20 MG PO TABS: 20.0000 mg | ORAL_TABLET | Freq: Every day | ORAL | 0 refills | Status: DC

## 2017-03-03 NOTE — Telephone Encounter (Signed)
Pt aware refill sent to pharmacy 

## 2017-03-03 NOTE — Telephone Encounter (Signed)
What is the name of the medication? Lexapro  Have you contacted your pharmacy to request a refill? yes  Which pharmacy would you like this sent to? [facility]   Patient notified that their request is being sent to the clinical staff for review and that they should receive a call once it is complete. If they do not receive a call within 24 hours they can check with their pharmacy or our office.

## 2017-03-12 ENCOUNTER — Other Ambulatory Visit: Payer: Self-pay | Admitting: Women's Health

## 2017-03-12 DIAGNOSIS — Z1231 Encounter for screening mammogram for malignant neoplasm of breast: Secondary | ICD-10-CM

## 2017-03-27 DIAGNOSIS — Z1283 Encounter for screening for malignant neoplasm of skin: Secondary | ICD-10-CM | POA: Diagnosis not present

## 2017-03-27 DIAGNOSIS — D225 Melanocytic nevi of trunk: Secondary | ICD-10-CM | POA: Diagnosis not present

## 2017-03-31 ENCOUNTER — Ambulatory Visit (INDEPENDENT_AMBULATORY_CARE_PROVIDER_SITE_OTHER): Payer: 59 | Admitting: Women's Health

## 2017-03-31 ENCOUNTER — Encounter: Payer: Self-pay | Admitting: Women's Health

## 2017-03-31 VITALS — BP 120/82 | Ht 61.0 in | Wt 140.0 lb

## 2017-03-31 DIAGNOSIS — R87610 Atypical squamous cells of undetermined significance on cytologic smear of cervix (ASC-US): Secondary | ICD-10-CM | POA: Diagnosis not present

## 2017-03-31 DIAGNOSIS — Z01419 Encounter for gynecological examination (general) (routine) without abnormal findings: Secondary | ICD-10-CM | POA: Diagnosis not present

## 2017-03-31 MED ORDER — FLUCONAZOLE 100 MG PO TABS: 100.0000 mg | ORAL_TABLET | ORAL | 0 refills | Status: DC | PRN

## 2017-03-31 NOTE — Patient Instructions (Signed)

## 2017-03-31 NOTE — Addendum Note (Signed)
Addended by: Lorine Bears on: 03/31/2017 09:09 AM   Modules accepted: Orders

## 2017-03-31 NOTE — Progress Notes (Signed)
Leslie Ritter Aug 21, 1970 [id number]    History:    Presents for annual exam. Monthly 7-8 day cycle/BTL. 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 2018 ASCUS. Primary care manages hypertension and anxiety labs and meds. HSV 1 rare outbreaks.  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 Leslie Ritter 22 doing well. Engaged no date set for marriage.  ROS:  A ROS was performed and pertinent positives and negatives are included.  Exam:  Vitals:   03/31/17 0802  Weight: 140 lb (63.5 kg)  Height: '5\' 1"'  (1.549 m)   Body mass index is 26.45 kg/m.   General appearance:  Normal Thyroid:  Symmetrical, normal in size, without palpable masses or nodularity. Respiratory  Auscultation:  Clear without wheezing or rhonchi Cardiovascular  Auscultation:  Regular rate, without rubs, murmurs or gallops  Edema/varicosities:  Not grossly evident Abdominal  Soft,nontender, without masses, guarding or rebound.  Liver/spleen:  No organomegaly noted  Hernia:  None appreciated  Skin  Inspection:  Grossly normal   Breasts: Examined lying and sitting.     Right: Without masses, retractions, discharge or axillary adenopathy.     Left: Without masses, retractions, discharge or axillary adenopathy. Gentitourinary   Inguinal/mons:  Normal without inguinal adenopathy  External genitalia:  Normal  BUS/Urethra/Skene's glands:  Normal  Vagina:  Normal  Cervix:  Normal  Uterus:  normal in size, shape and contour.  Midline and mobile  Adnexa/parametria:     Rt: Without masses or tenderness.   Lt: Without masses or tenderness.  Anus and perineum: Normal  Digital rectal exam: Normal sphincter tone without palpated masses or tenderness  Assessment/Plan:  47 y.o. engaged WF G1 P1 for annual exam with no complaints.  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 SBE's, continue annual screening mammogram, 3-D tomography reviewed and encouraged, history of dense breasts. Exercise, calcium rich foods, vitamin D 2000 daily encouraged. Diflucan 100 mg  after cycle as needed. Prescription given. Pap.    [place], 8:04 AM 03/31/2017

## 2017-04-03 LAB — PAP IG W/ RFLX HPV ASCU

## 2017-04-03 LAB — HUMAN PAPILLOMAVIRUS, HIGH RISK: HPV DNA High Risk: NOT DETECTED

## 2017-05-14 ENCOUNTER — Ambulatory Visit
Admission: RE | Admit: 2017-05-14 | Discharge: 2017-05-14 | Disposition: A | Discharge status: Home / Self Care | Payer: 59 | Source: Ambulatory Visit | Attending: Women's Health | Admitting: Women's Health

## 2017-05-14 ENCOUNTER — Ambulatory Visit: Payer: 59

## 2017-05-14 DIAGNOSIS — Z1231 Encounter for screening mammogram for malignant neoplasm of breast: Secondary | ICD-10-CM

## 2017-05-18 ENCOUNTER — Other Ambulatory Visit: Payer: Self-pay | Admitting: Nurse Practitioner

## 2017-05-18 DIAGNOSIS — I10 Essential (primary) hypertension: Secondary | ICD-10-CM

## 2017-05-26 ENCOUNTER — Encounter (INDEPENDENT_AMBULATORY_CARE_PROVIDER_SITE_OTHER): Payer: Self-pay

## 2017-05-28 ENCOUNTER — Encounter: Payer: Self-pay | Admitting: Nurse Practitioner

## 2017-05-28 ENCOUNTER — Ambulatory Visit: Payer: 59 | Admitting: Nurse Practitioner

## 2017-05-28 VITALS — BP 112/85 | Temp 97.7°F | Ht 61.0 in | Wt 140.0 lb

## 2017-05-28 DIAGNOSIS — I10 Essential (primary) hypertension: Secondary | ICD-10-CM | POA: Diagnosis not present

## 2017-05-28 DIAGNOSIS — F3342 Major depressive disorder, recurrent, in full remission: Secondary | ICD-10-CM | POA: Diagnosis not present

## 2017-05-28 DIAGNOSIS — F411 Generalized anxiety disorder: Secondary | ICD-10-CM | POA: Diagnosis not present

## 2017-05-28 LAB — CMP14+EGFR
ALBUMIN: 4.5 g/dL (ref 3.5–5.5)
ALT: 16 IU/L (ref 0–32)
AST: 16 IU/L (ref 0–40)
Albumin/Globulin Ratio: 2 (ref 1.2–2.2)
Alkaline Phosphatase: 48 IU/L (ref 39–117)
BUN/Creatinine Ratio: 23 (ref 9–23)
BUN: 17 mg/dL (ref 6–24)
Bilirubin Total: 0.7 mg/dL (ref 0.0–1.2)
CALCIUM: 9.9 mg/dL (ref 8.7–10.2)
CO2: 26 mmol/L (ref 20–29)
CREATININE: 0.74 mg/dL (ref 0.57–1.00)
Chloride: 102 mmol/L (ref 96–106)
GFR calc Af Amer: 112 mL/min/{1.73_m2} (ref >59)
GFR, EST NON AFRICAN AMERICAN: 97 mL/min/{1.73_m2} (ref >59)
GLOBULIN, TOTAL: 2.3 g/dL (ref 1.5–4.5)
Glucose: 100 mg/dL — ABNORMAL HIGH (ref 65–99)
Potassium: 5 mmol/L (ref 3.5–5.2)
SODIUM: 142 mmol/L (ref 134–144)
TOTAL PROTEIN: 6.8 g/dL (ref 6.0–8.5)

## 2017-05-28 LAB — LIPID PANEL
CHOL/HDL RATIO: 1.9 ratio (ref 0.0–4.4)
Cholesterol, Total: 150 mg/dL (ref 100–199)
HDL: 80 mg/dL (ref >39)
LDL CALC: 61 mg/dL (ref 0–99)
TRIGLYCERIDES: 47 mg/dL (ref 0–149)
VLDL Cholesterol Cal: 9 mg/dL (ref 5–40)

## 2017-05-28 MED ORDER — CLONAZEPAM 0.5 MG PO TABS: 0.5000 mg | ORAL_TABLET | Freq: Two times a day (BID) | ORAL | 2 refills | Status: DC | PRN

## 2017-05-28 MED ORDER — ESCITALOPRAM OXALATE 20 MG PO TABS: 20.0000 mg | ORAL_TABLET | Freq: Every day | ORAL | 1 refills | Status: DC

## 2017-05-28 MED ORDER — LISINOPRIL-HYDROCHLOROTHIAZIDE 20-12.5 MG PO TABS: 1.0000 | ORAL_TABLET | Freq: Every day | ORAL | 1 refills | Status: DC

## 2017-05-28 NOTE — Patient Instructions (Signed)
Stress and Stress Management Stress is a normal reaction to life events. It is what you feel when life demands more than you are used to or more than you can handle. Some stress can be useful. For example, the stress reaction can help you catch the last bus of the day, study for a test, or meet a deadline at work. But stress that occurs too often or for too long can cause problems. It can affect your emotional health and interfere with relationships and normal daily activities. Too much stress can weaken your immune system and increase your risk for physical illness. If you already have a medical problem, stress can make it worse. What are the causes? All sorts of life events may cause stress. An event that causes stress for one person may not be stressful for another person. Major life events commonly cause stress. These may be positive or negative. Examples include losing your job, moving into a new home, getting married, having a baby, or losing a loved one. Less obvious life events may also cause stress, especially if they occur day after day or in combination. Examples include working long hours, driving in traffic, caring for children, being in debt, or being in a difficult relationship. What are the signs or symptoms? Stress may cause emotional symptoms including, the following:  Anxiety. This is feeling worried, afraid, on edge, overwhelmed, or out of control.  Anger. This is feeling irritated or impatient.  Depression. This is feeling sad, down, helpless, or guilty.  Difficulty focusing, remembering, or making decisions.  Stress may cause physical symptoms, including the following:  Aches and pains. These may affect your head, neck, back, stomach, or other areas of your body.  Tight muscles or clenched jaw.  Low energy or trouble sleeping.  Stress may cause unhealthy behaviors, including the following:  Eating to feel better (overeating) or skipping meals.  Sleeping too little,  too much, or both.  Working too much or putting off tasks (procrastination).  Smoking, drinking alcohol, or using drugs to feel better.  How is this diagnosed? Stress is diagnosed through an assessment by your health care provider. Your health care provider will ask questions about your symptoms and any stressful life events.Your health care provider will also ask about your medical history and may order blood tests or other tests. Certain medical conditions and medicine can cause physical symptoms similar to stress. Mental illness can cause emotional symptoms and unhealthy behaviors similar to stress. Your health care provider may refer you to a mental health professional for further evaluation. How is this treated? Stress management is the recommended treatment for stress.The goals of stress management are reducing stressful life events and coping with stress in healthy ways. Techniques for reducing stressful life events include the following:  Stress identification. Self-monitor for stress and identify what causes stress for you. These skills may help you to avoid some stressful events.  Time management. Set your priorities, keep a calendar of events, and learn to say "no." These tools can help you avoid making too many commitments.  Techniques for coping with stress include the following:  Rethinking the problem. Try to think realistically about stressful events rather than ignoring them or overreacting. Try to find the positives in a stressful situation rather than focusing on the negatives.  Exercise. Physical exercise can release both physical and emotional tension. The key is to find a form of exercise you enjoy and do it regularly.  Relaxation techniques. These relax the body and  mind. Examples include yoga, meditation, tai chi, biofeedback, deep breathing, progressive muscle relaxation, listening to music, being out in nature, journaling, and other hobbies. Again, the key is to find  one or more that you enjoy and can do regularly.  Healthy lifestyle. Eat a balanced diet, get plenty of sleep, and do not smoke. Avoid using alcohol or drugs to relax.  Strong support network. Spend time with family, friends, or other people you enjoy being around.Express your feelings and talk things over with someone you trust.  Counseling or talktherapy with a mental health professional may be helpful if you are having difficulty managing stress on your own. Medicine is typically not recommended for the treatment of stress.Talk to your health care provider if you think you need medicine for symptoms of stress. Follow these instructions at home:  Keep all follow-up visits as directed by your health care provider.  Take all medicines as directed by your health care provider. Contact a health care provider if:  Your symptoms get worse or you start having new symptoms.  You feel overwhelmed by your problems and can no longer manage them on your own. Get help right away if:  You feel like hurting yourself or someone else. This information is not intended to replace advice given to you by your health care provider. Make sure you discuss any questions you have with your health care provider. Document Released: 07/02/2000 Document Revised: 06/14/2015 Document Reviewed: 08/31/2012 Elsevier Interactive Patient Education  2017 Elsevier Inc.  

## 2017-05-28 NOTE — Addendum Note (Signed)
Addended by: Rolena Infante on: 05/28/2017 08:44 AM   Modules accepted: Orders

## 2017-05-28 NOTE — Progress Notes (Signed)
Subjective:    Patient ID: Leslie Ritter, female    DOB: 11/11/1970, 47 y.o.   MRN: [id number]   Chief Complaint: Medical Management of Chronic Issues   HPI:  1. Essential hypertension  o c/o chest pain, sob or headache. Does not check blood pressure at home. BP Readings from Last 3 Encounters:  03/31/17 120/82  11/24/16 113/84  07/08/16 118/81     2. GAD (generalized anxiety disorder)  Patient Is currently on klonopin and only takes 3-4 times a week. Works well. She tries to not take. GAD 7 : Generalized Anxiety Score 05/28/2017 05/09/2016 10/25/2015 04/27/2015  Nervous, Anxious, on Edge 2 2 3 3   Control/stop worrying 1 2 2 3   Worry too much - different things 1 2 2 3   Trouble relaxing 2 1 2 3   Restless 2 0 2 3  Easily annoyed or irritable 1 2 2 3   Afraid - awful might happen 0 2 1 0  Total GAD 7 Score 9 11 14 18   Anxiety Difficulty Somewhat difficult Very difficult Not difficult at all Very difficult      3. Recurrent major depressive disorder, in full remission [facility])  She is on lexapro and says that it is working well. Denies any side effects. Depression screen [facility] 2/9 05/28/2017 11/24/2016 07/08/2016  Decreased Interest 0 0 0  Down, Depressed, Hopeless 0 0 0  PHQ - 2 Score 0 0 0  Altered sleeping - - -  Tired, decreased energy - - -  Change in appetite - - -  Feeling bad or failure about yourself  - - -  Trouble concentrating - - -  Moving slowly or fidgety/restless - - -  Suicidal thoughts - - -  PHQ-9 Score - - -  Difficult doing work/chores - - -       Outpatient Encounter Medications as of 05/28/2017  Medication Sig  . clonazePAM (KLONOPIN) 0.5 MG tablet Take 1 tablet (0.5 mg total) 2 (two) times daily as needed by mouth for anxiety.  Marland Kitchen escitalopram (LEXAPRO) 20 MG tablet Take 1 tablet (20 mg total) by mouth daily.  . fluconazole (DIFLUCAN) 100 MG tablet Take 1 tablet (100 mg total) by mouth as needed.  Marland Kitchen lisinopril-hydrochlorothiazide (PRINZIDE,ZESTORETIC)  20-12.5 MG tablet TAKE 1 TABLET BY MOUTH ONCE DAILY  . valACYclovir (VALTREX) 500 MG tablet Take 1 tablet (500 mg total) by mouth 2 (two) times daily.       New complaints: None today  Social history: Is engaged and is getting ready to build a new house together  Review of Systems  Constitutional: Negative for activity change and appetite change.  HENT: Negative.   Eyes: Negative for pain.  Respiratory: Negative for shortness of breath.   Cardiovascular: Negative for chest pain, palpitations and leg swelling.  Gastrointestinal: Negative for abdominal pain.  Endocrine: Negative for polydipsia.  Genitourinary: Negative.   Skin: Negative for rash.  Neurological: Negative for dizziness, weakness and headaches.  Hematological: Does not bruise/bleed easily.  Psychiatric/Behavioral: Negative.   All other systems reviewed and are negative.      Objective:   Physical Exam  Constitutional: She is oriented to person, place, and time.  HENT:  Head: Normocephalic.  Nose: Nose normal.  Mouth/Throat: Oropharynx is clear and moist.  Eyes: Pupils are equal, round, and reactive to light. EOM are normal.  Neck: Normal range of motion. Neck supple. No JVD present. Carotid bruit is not present.  Cardiovascular: Normal rate, regular rhythm, normal heart sounds  and intact distal pulses.  Pulmonary/Chest: Effort normal and breath sounds normal. No respiratory distress. She has no wheezes. She has no rales. She exhibits no tenderness.  Abdominal: Soft. Normal appearance, normal aorta and bowel sounds are normal. She exhibits no distension, no abdominal bruit, no pulsatile midline mass and no mass. There is no splenomegaly or hepatomegaly. There is no tenderness.  Musculoskeletal: Normal range of motion. She exhibits no edema.  Lymphadenopathy:    She has no cervical adenopathy.  Neurological: She is alert and oriented to person, place, and time. She has normal reflexes.  Skin: Skin is warm and  dry.  Psychiatric: Judgment normal.   BP 112/85   Temp 97.7 F (36.5 C) (Oral)   Ht 5\' 1"  (1.549 m)   Wt 140 lb (63.5 kg)   BMI 26.45 kg/m       Assessment & Plan:  LAN ENTSMINGER comes in today with chief complaint of Medical Management of Chronic Issues   Diagnosis and orders addressed:  1. Essential hypertension Low sodium diet - lisinopril-hydrochlorothiazide (PRINZIDE,ZESTORETIC) 20-12.5 MG tablet; Take 1 tablet by mouth daily.  Dispense: 90 tablet; Refill: 1  2. GAD (generalized anxiety disorder) Stress management - clonazePAM (KLONOPIN) 0.5 MG tablet; Take 1 tablet (0.5 mg total) by mouth 2 (two) times daily as needed for anxiety.  Dispense: 60 tablet; Refill: 2 3. Recurrent major depressive disorder, in full remission ([place]) - escitalopram (LEXAPRO) 20 MG tablet; Take 1 tablet (20 mg total) by mouth daily.  Dispense: 90 tablet; Refill: 1    Labs pending Health Maintenance reviewed Diet and exercise encouraged  Follow up plan: 6 months   Mary-Margaret Hassell Done, FNP

## 2017-05-29 ENCOUNTER — Ambulatory Visit: Payer: 59 | Admitting: Nurse Practitioner

## 2017-11-30 ENCOUNTER — Ambulatory Visit: Payer: 59 | Admitting: Nurse Practitioner

## 2017-11-30 ENCOUNTER — Encounter: Payer: Self-pay | Admitting: Nurse Practitioner

## 2017-11-30 VITALS — BP 110/79 | HR 76 | Temp 97.6°F | Ht 61.0 in | Wt 140.0 lb

## 2017-11-30 DIAGNOSIS — Z23 Encounter for immunization: Secondary | ICD-10-CM

## 2017-11-30 DIAGNOSIS — B009 Herpesviral infection, unspecified: Secondary | ICD-10-CM

## 2017-11-30 DIAGNOSIS — Z7689 Persons encountering health services in other specified circumstances: Secondary | ICD-10-CM | POA: Diagnosis not present

## 2017-11-30 DIAGNOSIS — F3342 Major depressive disorder, recurrent, in full remission: Secondary | ICD-10-CM

## 2017-11-30 DIAGNOSIS — F1320 Sedative, hypnotic or anxiolytic dependence, uncomplicated: Secondary | ICD-10-CM | POA: Insufficient documentation

## 2017-11-30 DIAGNOSIS — F411 Generalized anxiety disorder: Secondary | ICD-10-CM | POA: Diagnosis not present

## 2017-11-30 DIAGNOSIS — Z1321 Encounter for screening for nutritional disorder: Secondary | ICD-10-CM

## 2017-11-30 DIAGNOSIS — Z79899 Other long term (current) drug therapy: Secondary | ICD-10-CM | POA: Insufficient documentation

## 2017-11-30 DIAGNOSIS — I10 Essential (primary) hypertension: Secondary | ICD-10-CM

## 2017-11-30 MED ORDER — ESCITALOPRAM OXALATE 20 MG PO TABS: 20.0000 mg | ORAL_TABLET | Freq: Every day | ORAL | 1 refills | Status: DC

## 2017-11-30 MED ORDER — CLONAZEPAM 0.5 MG PO TABS: 0.5000 mg | ORAL_TABLET | Freq: Two times a day (BID) | ORAL | 5 refills | Status: DC | PRN

## 2017-11-30 MED ORDER — LISINOPRIL-HYDROCHLOROTHIAZIDE 20-12.5 MG PO TABS: 1.0000 | ORAL_TABLET | Freq: Every day | ORAL | 1 refills | Status: DC

## 2017-11-30 NOTE — Addendum Note (Signed)
Addended byFaylene Million C on: 11/30/2017 11:23 AM   Modules accepted: Orders

## 2017-11-30 NOTE — Patient Instructions (Signed)
Stress and Stress Management Stress is a normal reaction to life events. It is what you feel when life demands more than you are used to or more than you can handle. Some stress can be useful. For example, the stress reaction can help you catch the last bus of the day, study for a test, or meet a deadline at work. But stress that occurs too often or for too long can cause problems. It can affect your emotional health and interfere with relationships and normal daily activities. Too much stress can weaken your immune system and increase your risk for physical illness. If you already have a medical problem, stress can make it worse. What are the causes? All sorts of life events may cause stress. An event that causes stress for one person may not be stressful for another person. Major life events commonly cause stress. These may be positive or negative. Examples include losing your job, moving into a new home, getting married, having a baby, or losing a loved one. Less obvious life events may also cause stress, especially if they occur day after day or in combination. Examples include working long hours, driving in traffic, caring for children, being in debt, or being in a difficult relationship. What are the signs or symptoms? Stress may cause emotional symptoms including, the following:  Anxiety. This is feeling worried, afraid, on edge, overwhelmed, or out of control.  Anger. This is feeling irritated or impatient.  Depression. This is feeling sad, down, helpless, or guilty.  Difficulty focusing, remembering, or making decisions.  Stress may cause physical symptoms, including the following:  Aches and pains. These may affect your head, neck, back, stomach, or other areas of your body.  Tight muscles or clenched jaw.  Low energy or trouble sleeping.  Stress may cause unhealthy behaviors, including the following:  Eating to feel better (overeating) or skipping meals.  Sleeping too little,  too much, or both.  Working too much or putting off tasks (procrastination).  Smoking, drinking alcohol, or using drugs to feel better.  How is this diagnosed? Stress is diagnosed through an assessment by your health care provider. Your health care provider will ask questions about your symptoms and any stressful life events.Your health care provider will also ask about your medical history and may order blood tests or other tests. Certain medical conditions and medicine can cause physical symptoms similar to stress. Mental illness can cause emotional symptoms and unhealthy behaviors similar to stress. Your health care provider may refer you to a mental health professional for further evaluation. How is this treated? Stress management is the recommended treatment for stress.The goals of stress management are reducing stressful life events and coping with stress in healthy ways. Techniques for reducing stressful life events include the following:  Stress identification. Self-monitor for stress and identify what causes stress for you. These skills may help you to avoid some stressful events.  Time management. Set your priorities, keep a calendar of events, and learn to say "no." These tools can help you avoid making too many commitments.  Techniques for coping with stress include the following:  Rethinking the problem. Try to think realistically about stressful events rather than ignoring them or overreacting. Try to find the positives in a stressful situation rather than focusing on the negatives.  Exercise. Physical exercise can release both physical and emotional tension. The key is to find a form of exercise you enjoy and do it regularly.  Relaxation techniques. These relax the body and  mind. Examples include yoga, meditation, tai chi, biofeedback, deep breathing, progressive muscle relaxation, listening to music, being out in nature, journaling, and other hobbies. Again, the key is to find  one or more that you enjoy and can do regularly.  Healthy lifestyle. Eat a balanced diet, get plenty of sleep, and do not smoke. Avoid using alcohol or drugs to relax.  Strong support network. Spend time with family, friends, or other people you enjoy being around.Express your feelings and talk things over with someone you trust.  Counseling or talktherapy with a mental health professional may be helpful if you are having difficulty managing stress on your own. Medicine is typically not recommended for the treatment of stress.Talk to your health care provider if you think you need medicine for symptoms of stress. Follow these instructions at home:  Keep all follow-up visits as directed by your health care provider.  Take all medicines as directed by your health care provider. Contact a health care provider if:  Your symptoms get worse or you start having new symptoms.  You feel overwhelmed by your problems and can no longer manage them on your own. Get help right away if:  You feel like hurting yourself or someone else. This information is not intended to replace advice given to you by your health care provider. Make sure you discuss any questions you have with your health care provider. Document Released: 07/02/2000 Document Revised: 06/14/2015 Document Reviewed: 08/31/2012 Elsevier Interactive Patient Education  2017 Elsevier Inc.  

## 2017-11-30 NOTE — Addendum Note (Signed)
Addended byFaylene Million C on: 11/30/2017 11:20 AM   Modules accepted: Orders

## 2017-11-30 NOTE — Progress Notes (Signed)
Subjective:    Patient ID: Leslie Ritter, female    DOB: 1970-04-24, 47 y.o.   MRN: [id number]   Chief Complaint: Medication Refill   HPI:  1. Essential hypertension  -no c/o SOB/CP/HA -does not check BP at home BP Readings from Last 3 Encounters:  11/30/17 110/79  05/28/17 112/85  03/31/17 120/82    2. Recurrent major depressive disorder, in full remission [facility])    Office Visit from 11/30/2017 in [place]  PHQ-2 Total Score  0      3. GAD (generalized anxiety disorder)  -feels symptoms are controlled on Lexapro with PRN Klonopin GAD 7 : Generalized Anxiety Score 05/28/2017 05/09/2016 10/25/2015 04/27/2015  Nervous, Anxious, on Edge '2 2 3 3  ' Control/stop worrying '1 2 2 3  ' Worry too much - different things '1 2 2 3  ' Trouble relaxing '2 1 2 3  ' Restless 2 0 2 3  Easily annoyed or irritable '1 2 2 3  ' Afraid - awful might happen 0 2 1 0  Total GAD 7 Score '9 11 14 18  ' Anxiety Difficulty Somewhat difficult Very difficult Not difficult at all Very difficult     4. HSV-1 infection  -no recent cold sores -symptoms controlled with PRN Valtrex   5. Benzodiazepine dependence, continuous (HCC)  -takes Klonopin 5x/week   6. Controlled substance agreement signed      Outpatient Encounter Medications as of 11/30/2017  Medication Sig  . clonazePAM (KLONOPIN) 0.5 MG tablet Take 1 tablet (0.5 mg total) by mouth 2 (two) times daily as needed for anxiety.  Marland Kitchen escitalopram (LEXAPRO) 20 MG tablet Take 1 tablet (20 mg total) by mouth daily.  . fluconazole (DIFLUCAN) 100 MG tablet Take 1 tablet (100 mg total) by mouth as needed.  Marland Kitchen lisinopril-hydrochlorothiazide (PRINZIDE,ZESTORETIC) 20-12.5 MG tablet Take 1 tablet by mouth daily.  . valACYclovir (VALTREX) 500 MG tablet Take 1 tablet (500 mg total) by mouth 2 (two) times daily.   No facility-administered encounter medications on file as of 11/30/2017.       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  HENT: Negative for congestion, ear pain, rhinorrhea, sinus pressure, sinus pain and sore throat.   Eyes: Negative for pain, redness and visual disturbance.  Respiratory: Negative for cough, chest tightness, shortness of breath and wheezing.   Cardiovascular: Negative for chest pain, palpitations and leg swelling.  Gastrointestinal: Negative for abdominal pain, constipation, diarrhea, nausea and vomiting.  Endocrine: Negative for cold intolerance, heat intolerance, polydipsia, polyphagia and polyuria.  Genitourinary: Negative for difficulty urinating, dysuria and urgency.  Musculoskeletal: Negative for arthralgias, gait problem, joint swelling and myalgias.  Skin: Negative for rash and wound.  Allergic/Immunologic: Negative for environmental allergies and food allergies.  Neurological: Negative for dizziness, tremors, weakness and numbness.  Hematological: Does not bruise/bleed easily.  Psychiatric/Behavioral: Negative for behavioral problems, confusion, decreased concentration, sleep disturbance and suicidal ideas. The patient is not nervous/anxious.        Objective:   Physical Exam  Constitutional: She is oriented to person, place, and time. She appears well-developed and well-nourished.  HENT:  Head: Normocephalic and atraumatic.  Right Ear: External ear normal.  Left Ear: External ear normal.  Nose: Nose normal.  Mouth/Throat: Oropharynx is clear and moist. No oropharyngeal exudate.  Eyes: Pupils are equal, round, and reactive to light. Conjunctivae and EOM are normal.  Neck: Normal range of motion. Neck supple. No thyromegaly present.  Cardiovascular: Normal rate, regular rhythm, normal heart sounds and intact distal pulses.  Pulmonary/Chest: Effort normal and breath  sounds normal.  Abdominal: Soft. Bowel sounds are normal.  Musculoskeletal: Normal range of motion.  Neurological: She is alert and oriented to person, place, and time. She displays normal reflexes. No cranial nerve deficit.  Skin: Skin is warm and dry.  Psychiatric: She has a normal mood and affect. Her behavior is normal. Judgment and thought content normal.  Nursing note and vitals reviewed.   BP 110/79   Pulse 76   Temp 97.6 F (36.4 C) (Oral)   Ht '5\' 1"'  (1.549 m)   Wt 140 lb (63.5 kg)   BMI 26.45 kg/m        Assessment & Plan:  KIMERLY ROWAND comes in today with chief complaint of Medication Refill   Diagnosis and orders addressed:  1. Essential hypertension Low sodium diet - CMP14+EGFR - lisinopril-hydrochlorothiazide (PRINZIDE,ZESTORETIC) 20-12.5 MG tablet; Take 1 tablet by mouth daily.  Dispense: 90 tablet; Refill: 1  2. Recurrent major depressive disorder, in full remission ([place]) stres management  3. GAD (generalized anxiety disorder) - escitalopram (LEXAPRO) 20 MG tablet; Take 1 tablet (20 mg total) by mouth daily.  Dispense: 90 tablet; Refill: 1 - clonazePAM (KLONOPIN) 0.5 MG tablet; Take 1 tablet (0.5 mg total) by mouth 2 (two) times daily as needed for anxiety.  Dispense: 60 tablet; Refill: 5  4. HSV-1 infection  5. Benzodiazepine dependence, continuous (HCC) Refill klonopin  6. Controlled substance agreement signed Signed today   Labs pending Health Maintenance reviewed Diet and exercise encouraged  Follow up plan: 6 months   Mary-Margaret Hassell Done, FNP

## 2017-12-01 LAB — CMP14+EGFR
A/G RATIO: 1.9 (ref 1.2–2.2)
ALT: 16 IU/L (ref 0–32)
AST: 15 IU/L (ref 0–40)
Albumin: 4.6 g/dL (ref 3.5–5.5)
Alkaline Phosphatase: 49 IU/L (ref 39–117)
BILIRUBIN TOTAL: 0.9 mg/dL (ref 0.0–1.2)
BUN/Creatinine Ratio: 19 (ref 9–23)
BUN: 15 mg/dL (ref 6–24)
CALCIUM: 9.7 mg/dL (ref 8.7–10.2)
CO2: 26 mmol/L (ref 20–29)
Chloride: 99 mmol/L (ref 96–106)
Creatinine, Ser: 0.77 mg/dL (ref 0.57–1.00)
GFR calc Af Amer: 106 mL/min/{1.73_m2} (ref >59)
GFR, EST NON AFRICAN AMERICAN: 92 mL/min/{1.73_m2} (ref >59)
GLOBULIN, TOTAL: 2.4 g/dL (ref 1.5–4.5)
Glucose: 98 mg/dL (ref 65–99)
POTASSIUM: 4.9 mmol/L (ref 3.5–5.2)
SODIUM: 140 mmol/L (ref 134–144)
Total Protein: 7 g/dL (ref 6.0–8.5)

## 2017-12-03 LAB — SPECIMEN STATUS REPORT

## 2017-12-03 LAB — VITAMIN D 25 HYDROXY (VIT D DEFICIENCY, FRACTURES): Vit D, 25-Hydroxy: 53.7 ng/mL (ref 30.0–100.0)

## 2018-02-09 ENCOUNTER — Other Ambulatory Visit: Payer: Self-pay | Admitting: Women's Health

## 2018-02-09 DIAGNOSIS — Z1231 Encounter for screening mammogram for malignant neoplasm of breast: Secondary | ICD-10-CM

## 2018-04-07 ENCOUNTER — Ambulatory Visit (INDEPENDENT_AMBULATORY_CARE_PROVIDER_SITE_OTHER): Payer: 59 | Admitting: Women's Health

## 2018-04-07 ENCOUNTER — Encounter: Payer: Self-pay | Admitting: Women's Health

## 2018-04-07 ENCOUNTER — Other Ambulatory Visit: Payer: Self-pay

## 2018-04-07 VITALS — BP 118/78 | Ht 61.0 in | Wt 142.0 lb

## 2018-04-07 DIAGNOSIS — B009 Herpesviral infection, unspecified: Secondary | ICD-10-CM | POA: Diagnosis not present

## 2018-04-07 DIAGNOSIS — Z01419 Encounter for gynecological examination (general) (routine) without abnormal findings: Secondary | ICD-10-CM

## 2018-04-07 MED ORDER — VALACYCLOVIR HCL 500 MG PO TABS: 500.0000 mg | ORAL_TABLET | Freq: Two times a day (BID) | ORAL | 12 refills | Status: DC

## 2018-04-07 MED ORDER — FLUCONAZOLE 100 MG PO TABS: ORAL_TABLET | ORAL | 0 refills | Status: DC

## 2018-04-07 NOTE — Patient Instructions (Signed)

## 2018-04-07 NOTE — Progress Notes (Signed)
Leslie Ritter May 05, 1970 [id number]    History:    Presents for annual exam.  Mostly monthly cycle has had a few skipped cycles in the past year.  BTL.  Same partner.  2008 HGSIL with cryo and normal Paps until 2017 LGSIL, normal after that.  Normal mammogram history.  Primary care manages hypertension, anxiety.  Past medical history, past surgical history, family history and social history were all reviewed and documented in the EPIC chart.  Works at an [employer].  Leslie Ritter 23 doing well.  In the process of building a home.  Has reconnected with biologic father.  Mother, sister hypertension.  History of a kidney stone.  ROS:  A ROS was performed and pertinent positives and negatives are included.  Exam:  Vitals:   04/07/18 1121  BP: 118/78  Weight: 142 lb (64.4 kg)  Height: 5\' 1"  (1.549 m)   Body mass index is 26.83 kg/m.   General appearance:  Normal Thyroid:  Symmetrical, normal in size, without palpable masses or nodularity. Respiratory  Auscultation:  Clear without wheezing or rhonchi Cardiovascular  Auscultation:  Regular rate, without rubs, murmurs or gallops  Edema/varicosities:  Not grossly evident Abdominal  Soft,nontender, without masses, guarding or rebound.  Liver/spleen:  No organomegaly noted  Hernia:  None appreciated  Skin  Inspection:  Grossly normal   Breasts: Examined lying and sitting.     Right: Without masses, retractions, discharge or axillary adenopathy.     Left: Without masses, retractions, discharge or axillary adenopathy. Gentitourinary   Inguinal/mons:  Normal without inguinal adenopathy  External genitalia:  Normal  BUS/Urethra/Skene's glands:  Normal  Vagina:  Normal  Cervix:  Normal  Uterus:   normal in size, shape and contour.  Midline and mobile  Adnexa/parametria:     Rt: Without masses or tenderness.   Lt: Without masses or tenderness.  Anus and perineum: Normal  Digital rectal exam: Normal sphincter tone without palpated  masses or tenderness  Assessment/Plan:  48 y.o. engaged WF G1, P1 for annual exam with no complaints.  Mostly monthly cycle/BTL/same partner 2008 HGSIL cryo, 2017 LGSIL Hypertension/anxiety/depression-primary care manages labs and meds HSV 1 rare outbreaks  Plan: Valtrex 500 twice daily for 3 to 5 days as needed prescription, proper use given and reviewed.  SBEs, continue annual 3D screening mammogram has scheduled.  Encouraged increased regular cardio type exercise, calcium rich foods, vitamin D 1000 daily encouraged.  Menopause discussed.  UA, Pap, (Pap negative HR HPV 2019).  New screening guidelines reviewed.    [place], 12:30 PM 04/07/2018

## 2018-04-07 NOTE — Addendum Note (Signed)
Addended by: Lorine Bears on: 04/07/2018 12:41 PM   Modules accepted: Orders

## 2018-04-08 LAB — PAP IG W/ RFLX HPV ASCU

## 2018-04-08 LAB — URINALYSIS, COMPLETE W/RFL CULTURE
BILIRUBIN URINE: NEGATIVE
Bacteria, UA: NONE SEEN /HPF
Glucose, UA: NEGATIVE
Hgb urine dipstick: NEGATIVE
Hyaline Cast: NONE SEEN /LPF
Ketones, ur: NEGATIVE
Leukocyte Esterase: NEGATIVE
NITRITES URINE, INITIAL: NEGATIVE
PH: 6 (ref 5.0–8.0)
Protein, ur: NEGATIVE
RBC / HPF: NONE SEEN /HPF (ref 0–2)
SPECIFIC GRAVITY, URINE: 1.011 (ref 1.001–1.03)
Squamous Epithelial / LPF: NONE SEEN /HPF (ref <=5)
WBC, UA: NONE SEEN /HPF (ref 0–5)

## 2018-04-08 LAB — NO CULTURE INDICATED

## 2018-05-20 ENCOUNTER — Ambulatory Visit: Payer: 59

## 2018-06-04 ENCOUNTER — Encounter: Payer: Self-pay | Admitting: Nurse Practitioner

## 2018-06-04 ENCOUNTER — Ambulatory Visit (INDEPENDENT_AMBULATORY_CARE_PROVIDER_SITE_OTHER): Payer: 59 | Admitting: Nurse Practitioner

## 2018-06-04 ENCOUNTER — Other Ambulatory Visit: Payer: Self-pay

## 2018-06-04 DIAGNOSIS — F3342 Major depressive disorder, recurrent, in full remission: Secondary | ICD-10-CM | POA: Diagnosis not present

## 2018-06-04 DIAGNOSIS — F411 Generalized anxiety disorder: Secondary | ICD-10-CM | POA: Diagnosis not present

## 2018-06-04 DIAGNOSIS — Z6826 Body mass index (BMI) 26.0-26.9, adult: Secondary | ICD-10-CM

## 2018-06-04 DIAGNOSIS — I10 Essential (primary) hypertension: Secondary | ICD-10-CM

## 2018-06-04 MED ORDER — ESCITALOPRAM OXALATE 20 MG PO TABS: 20.0000 mg | ORAL_TABLET | Freq: Every day | ORAL | 1 refills | Status: DC

## 2018-06-04 MED ORDER — CLONAZEPAM 0.5 MG PO TABS: 0.5000 mg | ORAL_TABLET | Freq: Two times a day (BID) | ORAL | 5 refills | Status: DC | PRN

## 2018-06-04 MED ORDER — LISINOPRIL-HYDROCHLOROTHIAZIDE 20-12.5 MG PO TABS: 1.0000 | ORAL_TABLET | Freq: Every day | ORAL | 1 refills | Status: DC

## 2018-06-04 NOTE — Progress Notes (Signed)
Virtual Visit via telephone Note  I connected with Leslie Ritter on 06/04/18 at 2:15 pm by telephone and verified that I am speaking with the correct person using two identifiers. Leslie Ritter is currently located at [facility] and no one is currently with her during visit. The provider, Mary-Margaret Hassell Done, FNP is located in their office at time of visit.  I discussed the limitations, risks, security and privacy concerns of performing an evaluation and management service by telephone and the availability of in person appointments. I also discussed with the patient that there may be a patient responsible charge related to this service. The patient expressed understanding and agreed to proceed.   History and Present Illness:   Chief Complaint: medical management of chronic issues   HPI:  1. Essential hypertension No c/o chest pain, sob or headache. Does not check blood pressure at home. BP Readings from Last 3 Encounters:  04/07/18 118/78  11/30/17 110/79  05/28/17 112/85     2. Recurrent major depressive disorder, in full remission [facility]) She is on lexapro and is doing well.  Depression screen [facility] 2/9 06/04/2018 11/30/2017 05/28/2017  Decreased Interest 0 0 0  Down, Depressed, Hopeless 0 0 0  PHQ - 2 Score 0 0 0  Altered sleeping - 0 -  Tired, decreased energy - 0 -  Change in appetite - 0 -  Feeling bad or failure about yourself  - 0 -  Trouble concentrating - 0 -  Moving slowly or fidgety/restless - 0 -  Suicidal thoughts - 0 -  PHQ-9 Score - 0 -  Difficult doing work/chores - Not difficult at all -     3. GAD (generalized anxiety disorder) Is on klonopin bid and is doing well.  4. BMI 26.0-26.9,adult No recent weight gain    Outpatient Encounter Medications as of 06/04/2018  Medication Sig  . clonazePAM (KLONOPIN) 0.5 MG tablet Take 1 tablet (0.5 mg total) by mouth 2 (two) times daily as needed for anxiety.  Marland Kitchen escitalopram (LEXAPRO) 20 MG tablet Take 1 tablet (20 mg  total) by mouth daily.  . fluconazole (DIFLUCAN) 100 MG tablet Take 1 tablet weekly  . lisinopril-hydrochlorothiazide (PRINZIDE,ZESTORETIC) 20-12.5 MG tablet Take 1 tablet by mouth daily.  . valACYclovir (VALTREX) 500 MG tablet Take 1 tablet (500 mg total) by mouth 2 (two) times daily.    New complaints: None today  Social history: Is currently building a house with her fiancee     Review of Systems  Constitutional: Negative for diaphoresis and weight loss.  Eyes: Negative for blurred vision, double vision and pain.  Respiratory: Negative for shortness of breath.   Cardiovascular: Negative for chest pain, palpitations, orthopnea and leg swelling.  Gastrointestinal: Negative for abdominal pain.  Skin: Negative for rash.  Neurological: Negative for dizziness, sensory change, loss of consciousness, weakness and headaches.  Endo/Heme/Allergies: Negative for polydipsia. Does not bruise/bleed easily.  Psychiatric/Behavioral: Negative for memory loss. The patient does not have insomnia.   All other systems reviewed and are negative.    Observations/Objective: Alert and oriented- answers all questions appropriately No distress  Assessment and Plan: Leslie Ritter comes in today with chief complaint of Medical Management of Chronic Issues   Diagnosis and orders addressed:  1. Essential hypertension Low sodium diet - lisinopril-hydrochlorothiazide (ZESTORETIC) 20-12.5 MG tablet; Take 1 tablet by mouth daily.  Dispense: 90 tablet; Refill: 1  2. Recurrent major depressive disorder, in full remission ([place]) Continue lexapro as rx  3. GAD (  generalized anxiety disorder) Stress management - escitalopram (LEXAPRO) 20 MG tablet; Take 1 tablet (20 mg total) by mouth daily.  Dispense: 90 tablet; Refill: 1 - clonazePAM (KLONOPIN) 0.5 MG tablet; Take 1 tablet (0.5 mg total) by mouth 2 (two) times daily as needed for anxiety.  Dispense: 60 tablet; Refill: 5  4. BMI 26.0-26.9,adult Discussed  diet and exercise for person with BMI >25 Will recheck weight in 3-6 months   Previous lab resultsreviewed Health Maintenance reviewed Diet and exercise encouraged  Follow up plan: 3 months      I discussed the assessment and treatment plan with the patient. The patient was provided an opportunity to ask questions and all were answered. The patient agreed with the plan and demonstrated an understanding of the instructions.   The patient was advised to call back or seek an in-person evaluation if the symptoms worsen or if the condition fails to improve as anticipated.  The above assessment and management plan was discussed with the patient. The patient verbalized understanding of and has agreed to the management plan. Patient is aware to call the clinic if symptoms persist or worsen. Patient is aware when to return to the clinic for a follow-up visit. Patient educated on when it is appropriate to go to the emergency department.   Time call ended:  2:30  I provided 20 minutes of non-face-to-face time during this encounter.    Mary-Margaret Hassell Done, FNP

## 2018-07-01 ENCOUNTER — Other Ambulatory Visit: Payer: Self-pay

## 2018-07-01 ENCOUNTER — Ambulatory Visit
Admission: RE | Admit: 2018-07-01 | Discharge: 2018-07-01 | Disposition: A | Discharge status: Home / Self Care | Payer: 59 | Source: Ambulatory Visit | Attending: Women's Health | Admitting: Women's Health

## 2018-07-01 DIAGNOSIS — Z1231 Encounter for screening mammogram for malignant neoplasm of breast: Secondary | ICD-10-CM

## 2018-09-03 ENCOUNTER — Other Ambulatory Visit: Payer: Self-pay

## 2018-09-03 ENCOUNTER — Ambulatory Visit: Payer: 59 | Admitting: Family

## 2018-09-03 ENCOUNTER — Encounter: Payer: Self-pay | Admitting: Family

## 2018-09-03 VITALS — BP 108/77 | HR 69 | Temp 98.6°F | Ht 61.0 in | Wt 136.8 lb

## 2018-09-03 DIAGNOSIS — M7701 Medial epicondylitis, right elbow: Secondary | ICD-10-CM

## 2018-09-03 MED ORDER — PREDNISONE 10 MG (21) PO TBPK: ORAL_TABLET | ORAL | 0 refills | Status: DC

## 2018-09-03 MED ORDER — DICLOFENAC SODIUM 75 MG PO TBEC: 75.0000 mg | DELAYED_RELEASE_TABLET | Freq: Two times a day (BID) | ORAL | 0 refills | Status: DC

## 2018-09-03 NOTE — Patient Instructions (Signed)
Golfer's Elbow  Golfer's elbow, also called medial epicondylitis, is a condition that results from inflammation of the strong bands of tissue (tendons) that attach your forearm muscles to the inside of your bone at the elbow. These tendons affect the muscles that bend the palm toward the wrist (flexion). The tendons become less flexible with age. This condition is called golfer's elbow because it is more common among people who constantly bend and twist their wrists, such as golfers. This injury is usually caused by overuse. What are the causes? This condition is caused by:  Repeatedly flexing, turning, or twisting your wrist.  Constantly gripping objects with your hands. What increases the risk? This condition is more likely to develop in people who play golf, baseball, or tennis. This injury is more common among people who have jobs that require the constant use of their hands, such as:  Carpenters.  Butchers.  Musicians.  Typists. What are the signs or symptoms? This condition causes elbow pain that may spread to your forearm and upper arm. Symptoms of this condition include.  Pain at the inner elbow, forearm, or wrist.  Reduced grip strength. The pain may get worse when you bend your wrist downward. How is this diagnosed? This condition is diagnosed based on your symptoms, medical history, and a physical exam. During the exam, your health care provider may:  Test your grip strength.  Move your wrist to check for pain. You may also have an MRI to:  Confirm the diagnosis.  Look for other issues.  Check for tears in the ligaments, muscles, or tendons. How is this treated? Treatment for this condition includes:  Stopping all activities that make you bend or twist your elbow or wrist and waiting until your pain and other symptoms go away before resuming those activities.  Wearing an elbow brace or wrist splint to restrict the movements that cause symptoms.  Icing your  inner elbow, forearm, or wrist to relieve pain.  Taking NSAIDs or getting corticosteroid injections to reduce pain and swelling.  Doing stretching, range-of-motion, and strengthening exercises (physical therapy) as told by your health care provider. In rare cases, surgery may be needed if your condition does not improve. Follow these instructions at home: If you have a brace or splint:  Wear it as told by your health care provider.  Loosen it if your fingers tingle, become numb, or turn cold and blue.  Keep it clean. Managing pain, stiffness, and swelling   If directed, put ice on the injured area. ? Put ice in a plastic bag. ? Place a towel between your skin and the bag. ? Leave the ice on for 20 minutes, 2-3 times a day.  Move your fingers often to avoid stiffness.  Raise (elevate) the injured area above the level of your heart while you are sitting or lying down. Activity  Rest your injured area as told by your health care provider.  Return to your normal activities as told by your health care provider. Ask your health care provider what activities are safe for you.  Do exercises as told by your health care provider. Lifestyle  If your condition is caused by sports, work with a trainer to make sure that you: ? Have the correct technique. ? Are using the proper equipment.  If your condition is work related, talk with your employer about changes that can be made. General instructions  Take over-the-counter and prescription medicines only as told by your health care provider.  Do not   use any products that contain nicotine or tobacco, such as cigarettes, e-cigarettes, and chewing tobacco. If you need help quitting, ask your health care provider.  Keep all follow-up visits as told by your health care provider. This is important. How is this prevented?  Before and after activity: ? Warm up and stretch before being active. ? Cool down and stretch after being active. ?  Give your body time to rest between periods of activity.  During activity: ? Make sure to use equipment that fits you. ? If you play golf, slow your golf swing to reduce shock in the arm when making contact with the ball.  Maintain physical fitness, including: ? Strength. ? Flexibility. ? Cardiovascular fitness. ? Endurance.  Do exercises to strengthen the forearm muscles. Contact a health care provider if:  Your pain does not improve or it gets worse.  You notice numbness in your hand. Get help right away if:  Your pain is severe.  You cannot move your wrist. Summary  Golfer's elbow, also called medial epicondylitis, is a condition that results from inflammation of the strong bands of tissue (tendons) that attach your forearm muscles to the inside of your bone at the elbow.  This injury usually results from overuse.  Symptoms of this condition include decreased grip strength and pain at the inner elbow, forearm, or wrist.  This injury is treated with rest, ice, medicines, physical therapy, and surgery as needed. This information is not intended to replace advice given to you by your health care provider. Make sure you discuss any questions you have with your health care provider. Document Released: 01/06/2005 Document Revised: 04/29/2018 Document Reviewed: 11/12/2017 Elsevier Patient Education  2020 [employer].

## 2018-09-03 NOTE — Progress Notes (Signed)
   Subjective:    Patient ID: Leslie Ritter, female    DOB: August 22, 1970, 48 y.o.   MRN: [id number]  Chief Complaint  Patient presents with  . right lower arm pain    Arm Pain  The incident occurred more than 1 week ago. The injury mechanism was repetitive motion (she moved  last week and was moving heavy boxes and objects). Pain location: right forearm. The quality of the pain is described as aching. The pain does not radiate. The pain is at a severity of 6/10. The pain is moderate. The pain has been intermittent since the incident. The symptoms are aggravated by movement and lifting. She has tried rest and NSAIDs for the symptoms. The treatment provided mild relief.      Review of Systems  All other systems reviewed and are negative.      Objective:   Physical Exam Vitals signs reviewed.  Constitutional:      General: She is not in acute distress.    Appearance: She is well-developed.  HENT:     Head: Normocephalic and atraumatic.  Eyes:     Pupils: Pupils are equal, round, and reactive to light.  Neck:     Musculoskeletal: Normal range of motion and neck supple.     Thyroid: No thyromegaly.  Cardiovascular:     Rate and Rhythm: Normal rate and regular rhythm.     Heart sounds: Normal heart sounds. No murmur.  Pulmonary:     Effort: Pulmonary effort is normal. No respiratory distress.     Breath sounds: Normal breath sounds. No wheezing.  Abdominal:     General: Bowel sounds are normal. There is no distension.     Palpations: Abdomen is soft.     Tenderness: There is no abdominal tenderness.  Musculoskeletal:        General: Tenderness present.       Arms:     Comments: Pain in right forearm with flexion and extension  Skin:    General: Skin is warm and dry.  Neurological:     Mental Status: She is alert and oriented to person, place, and time.     Cranial Nerves: No cranial nerve deficit.     Deep Tendon Reflexes: Reflexes are normal and symmetric.   Psychiatric:        Behavior: Behavior normal.        Thought Content: Thought content normal.        Judgment: Judgment normal.     BP 108/77   Pulse 69   Temp 98.6 F (37 C) (Temporal)   Ht 5\' 1"  (1.549 m)   Wt 136 lb 12.8 oz (62.1 kg)   BMI 25.85 kg/m      Assessment & Plan:  [name] comes in today with chief complaint of right lower arm pain   Diagnosis and orders addressed:  1. Medial epicondylitis of right elbow Rest Ice  ROM exercises encouraged No other NSAID's while taking diclofenac, tylenol as needed RTO in 2 weeks if pain has not improved or worsen  - predniSONE (STERAPRED UNI-PAK 21 TAB) 10 MG (21) TBPK tablet; Use as directed  Dispense: 21 tablet; Refill: 0 - diclofenac (VOLTAREN) 75 MG EC tablet; Take 1 tablet (75 mg total) by mouth 2 (two) times daily.  Dispense: 30 tablet; Refill: 0   Evelina Dun, FNP

## 2018-09-08 ENCOUNTER — Other Ambulatory Visit: Payer: Self-pay

## 2018-09-09 ENCOUNTER — Ambulatory Visit: Payer: 59 | Admitting: Nurse Practitioner

## 2018-09-09 ENCOUNTER — Encounter: Payer: Self-pay | Admitting: Nurse Practitioner

## 2018-09-09 VITALS — BP 115/81 | HR 66 | Temp 98.9°F | Ht 61.0 in | Wt 138.0 lb

## 2018-09-09 DIAGNOSIS — F411 Generalized anxiety disorder: Secondary | ICD-10-CM | POA: Diagnosis not present

## 2018-09-09 DIAGNOSIS — F3342 Major depressive disorder, recurrent, in full remission: Secondary | ICD-10-CM

## 2018-09-09 DIAGNOSIS — I10 Essential (primary) hypertension: Secondary | ICD-10-CM

## 2018-09-09 DIAGNOSIS — Z6826 Body mass index (BMI) 26.0-26.9, adult: Secondary | ICD-10-CM | POA: Diagnosis not present

## 2018-09-09 MED ORDER — CLONAZEPAM 0.5 MG PO TABS: 0.5000 mg | ORAL_TABLET | Freq: Two times a day (BID) | ORAL | 5 refills | Status: DC | PRN

## 2018-09-09 MED ORDER — LISINOPRIL-HYDROCHLOROTHIAZIDE 20-12.5 MG PO TABS: 1.0000 | ORAL_TABLET | Freq: Every day | ORAL | 1 refills | Status: DC

## 2018-09-09 MED ORDER — ESCITALOPRAM OXALATE 20 MG PO TABS: 20.0000 mg | ORAL_TABLET | Freq: Every day | ORAL | 1 refills | Status: DC

## 2018-09-09 NOTE — Progress Notes (Signed)
Subjective:    Patient ID: Leslie Ritter, female    DOB: 01-Oct-1970, 48 y.o.   MRN: [id number]   Chief Complaint: Medical Management of Chronic Issues    HPI:  1. Essential hypertension No c/lo chest pain, sob or headache. Does not check blood pressure at home. BP Readings from Last 3 Encounters:  09/09/18 115/81  09/03/18 108/77  04/07/18 118/78     2. Recurrent major depressive disorder, in full remission ([place]) Is on lexapro daily and is doing well. Depression screen [facility] 2/9 09/09/2018 09/03/2018 06/04/2018  Decreased Interest 0 0 0  Down, Depressed, Hopeless 0 0 0  PHQ - 2 Score 0 0 0  Altered sleeping - - -  Tired, decreased energy - - -  Change in appetite - - -  Feeling bad or failure about yourself  - - -  Trouble concentrating - - -  Moving slowly or fidgety/restless - - -  Suicidal thoughts - - -  PHQ-9 Score - - -  Difficult doing work/chores - - -     3. GAD (generalized anxiety disorder) Is on klonopin bid and is doing well. Stress level better since sh ehas finished her house.  4. BMI 26.0-26.9,adult No recent weight changes    Outpatient Encounter Medications as of 09/09/2018  Medication Sig  . clonazePAM (KLONOPIN) 0.5 MG tablet Take 1 tablet (0.5 mg total) by mouth 2 (two) times daily as needed for anxiety.  . diclofenac (VOLTAREN) 75 MG EC tablet Take 1 tablet (75 mg total) by mouth 2 (two) times daily.  Marland Kitchen escitalopram (LEXAPRO) 20 MG tablet Take 1 tablet (20 mg total) by mouth daily.  Marland Kitchen lisinopril-hydrochlorothiazide (ZESTORETIC) 20-12.5 MG tablet Take 1 tablet by mouth daily.  . valACYclovir (VALTREX) 500 MG tablet Take 1 tablet (500 mg total) by mouth 2 (two) times daily.  . [DISCONTINUED] predniSONE (STERAPRED UNI-PAK 21 TAB) 10 MG (21) TBPK tablet Use as directed     Past Surgical History:  Procedure Laterality Date  . GYNECOLOGIC CRYOSURGERY  1992  . LAPAROSCOPIC TUBAL LIGATION Bilateral 03/30/2012   Procedure: LAPAROSCOPIC TUBAL  LIGATION;  Surgeon: Anastasio Auerbach, MD;  Location: [facility];  Service: Gynecology;  Laterality: Bilateral;  . mole removed     face - left cheek  . WISDOM TOOTH EXTRACTION      Family History  Problem Relation Age of Onset  . Hypertension Mother   . Hypertension Sister   . Diabetes Maternal Grandmother   . Hypertension Maternal Grandfather   . Heart disease Maternal Grandfather     New complaints: None today  Social history: Lives with her new husband in her new house  Controlled substance contract: 12/03/17     Review of Systems  Constitutional: Negative for activity change and appetite change.  HENT: Negative.   Eyes: Negative for pain.  Respiratory: Negative for shortness of breath.   Cardiovascular: Negative for chest pain, palpitations and leg swelling.  Gastrointestinal: Negative for abdominal pain.  Endocrine: Negative for polydipsia.  Genitourinary: Negative.   Skin: Negative for rash.  Neurological: Negative for dizziness, weakness and headaches.  Hematological: Does not bruise/bleed easily.  Psychiatric/Behavioral: Negative.   All other systems reviewed and are negative.      Objective:   Physical Exam Vitals signs and nursing note reviewed.  Constitutional:      General: She is not in acute distress.    Appearance: Normal appearance. She is well-developed.  HENT:     Head: Normocephalic.  Nose: Nose normal.  Eyes:     Pupils: Pupils are equal, round, and reactive to light.  Neck:     Musculoskeletal: Normal range of motion and neck supple.     Vascular: No carotid bruit or JVD.  Cardiovascular:     Rate and Rhythm: Normal rate and regular rhythm.     Heart sounds: Normal heart sounds.  Pulmonary:     Effort: Pulmonary effort is normal. No respiratory distress.     Breath sounds: Normal breath sounds. No wheezing or rales.  Chest:     Chest wall: No tenderness.  Abdominal:     General: Bowel sounds are normal. There is no distension or  abdominal bruit.     Palpations: Abdomen is soft. There is no hepatomegaly, splenomegaly, mass or pulsatile mass.     Tenderness: There is no abdominal tenderness.  Musculoskeletal: Normal range of motion.  Lymphadenopathy:     Cervical: No cervical adenopathy.  Skin:    General: Skin is warm and dry.  Neurological:     Mental Status: She is alert and oriented to person, place, and time.     Deep Tendon Reflexes: Reflexes are normal and symmetric.  Psychiatric:        Behavior: Behavior normal.        Thought Content: Thought content normal.        Judgment: Judgment normal.    BP 115/81   Pulse 66   Temp 98.9 F (37.2 C) (Oral)   Ht 5\' 1"  (1.549 m)   Wt 138 lb (62.6 kg)   BMI 26.07 kg/m         Assessment & Plan:  Leslie Ritter comes in today with chief complaint of Medical Management of Chronic Issues   Diagnosis and orders addressed:  1. Essential hypertension Low sodium diet - lisinopril-hydrochlorothiazide (ZESTORETIC) 20-12.5 MG tablet; Take 1 tablet by mouth daily.  Dispense: 90 tablet; Refill: 1  2. Recurrent major depressive disorder, in full remission ([place]) Continue stress management  3. GAD (generalized anxiety disorder) - escitalopram (LEXAPRO) 20 MG tablet; Take 1 tablet (20 mg total) by mouth daily.  Dispense: 90 tablet; Refill: 1 - clonazePAM (KLONOPIN) 0.5 MG tablet; Take 1 tablet (0.5 mg total) by mouth 2 (two) times daily as needed for anxiety.  Dispense: 60 tablet; Refill: 5  4. BMI 26.0-26.9,adult Discussed diet and exercise for person with BMI >25 Will recheck weight in 3-6 months   Labs pending Health Maintenance reviewed Diet and exercise encouraged  Follow up plan: 6 months   Mary-Margaret Hassell Done, FNP

## 2018-09-10 LAB — CMP14+EGFR
ALT: 18 IU/L (ref 0–32)
AST: 13 IU/L (ref 0–40)
Albumin/Globulin Ratio: 2 (ref 1.2–2.2)
Albumin: 4.7 g/dL (ref 3.8–4.8)
Alkaline Phosphatase: 41 IU/L (ref 39–117)
BUN/Creatinine Ratio: 26 — ABNORMAL HIGH (ref 9–23)
BUN: 19 mg/dL (ref 6–24)
Bilirubin Total: 0.6 mg/dL (ref 0.0–1.2)
CO2: 26 mmol/L (ref 20–29)
Calcium: 9.9 mg/dL (ref 8.7–10.2)
Chloride: 96 mmol/L (ref 96–106)
Creatinine, Ser: 0.72 mg/dL (ref 0.57–1.00)
GFR calc Af Amer: 115 mL/min/{1.73_m2} (ref >59)
GFR calc non Af Amer: 99 mL/min/{1.73_m2} (ref >59)
Globulin, Total: 2.4 g/dL (ref 1.5–4.5)
Glucose: 119 mg/dL — ABNORMAL HIGH (ref 65–99)
Potassium: 4.1 mmol/L (ref 3.5–5.2)
Sodium: 137 mmol/L (ref 134–144)
Total Protein: 7.1 g/dL (ref 6.0–8.5)

## 2018-09-10 LAB — LIPID PANEL
Chol/HDL Ratio: 1.6 ratio (ref 0.0–4.4)
Cholesterol, Total: 178 mg/dL (ref 100–199)
HDL: 114 mg/dL (ref >39)
LDL Calculated: 55 mg/dL (ref 0–99)
Triglycerides: 43 mg/dL (ref 0–149)
VLDL Cholesterol Cal: 9 mg/dL (ref 5–40)

## 2018-09-20 ENCOUNTER — Telehealth: Payer: Self-pay | Admitting: Family

## 2018-09-20 NOTE — Telephone Encounter (Signed)
Please advise 

## 2018-09-20 NOTE — Telephone Encounter (Signed)
Is she wearing a brace. Can take up to 6 weeks  To get better

## 2018-09-20 NOTE — Telephone Encounter (Signed)
Pt aware.

## 2018-10-12 ENCOUNTER — Encounter: Payer: Self-pay | Admitting: Gynecology

## 2018-10-19 ENCOUNTER — Telehealth: Payer: Self-pay | Admitting: Nurse Practitioner

## 2018-10-19 NOTE — Telephone Encounter (Signed)
Appointment scheduled 10/22/2018 at 10:00 am

## 2018-10-21 ENCOUNTER — Other Ambulatory Visit: Payer: Self-pay

## 2018-10-22 ENCOUNTER — Encounter: Payer: Self-pay | Admitting: Nurse Practitioner

## 2018-10-22 ENCOUNTER — Ambulatory Visit (INDEPENDENT_AMBULATORY_CARE_PROVIDER_SITE_OTHER): Payer: 59 | Admitting: Nurse Practitioner

## 2018-10-22 VITALS — BP 122/85 | HR 62 | Temp 98.4°F | Ht 61.0 in | Wt 136.0 lb

## 2018-10-22 DIAGNOSIS — M7701 Medial epicondylitis, right elbow: Secondary | ICD-10-CM | POA: Diagnosis not present

## 2018-10-22 MED ORDER — PREDNISONE 20 MG PO TABS: ORAL_TABLET | ORAL | 0 refills | Status: DC

## 2018-10-22 NOTE — Progress Notes (Signed)
   Subjective:    Patient ID: Leslie Ritter, female    DOB: 07/11/1970, 48 y.o.   MRN: [id number]   Chief Complaint: Elbow Pain (right)   HPI Patient come sin today c/o right elbow pain. Sh esaw Leslie Ritter on 09/03/18 and was dx with right epichrondylitis.  She has been wearing a elbow strap which she says has not helped. She has also been taking her voltaren bid but has ran out. Working increases pain. 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   Review of Systems  Constitutional: Negative for activity change and appetite change.  HENT: Negative.   Eyes: Negative for pain.  Respiratory: Negative for shortness of breath.   Cardiovascular: Negative for chest pain, palpitations and leg swelling.  Gastrointestinal: Negative for abdominal pain.  Endocrine: Negative for polydipsia.  Genitourinary: Negative.   Skin: Negative for rash.  Neurological: Negative for dizziness, weakness and headaches.  Hematological: Does not bruise/bleed easily.  Psychiatric/Behavioral: Negative.   All other systems reviewed and are negative.      Objective:   Physical Exam Vitals signs and nursing note reviewed.  Constitutional:      Appearance: Normal appearance.  Cardiovascular:     Rate and Rhythm: Normal rate and regular rhythm.     Heart sounds: Normal heart sounds.  Pulmonary:     Breath sounds: Normal breath sounds.  Skin:    General: Skin is warm and dry.  Neurological:     General: No focal deficit present.     Mental Status: She is alert and oriented to person, place, and time.  Psychiatric:        Mood and Affect: Mood normal.        Behavior: Behavior normal.    BP 122/85   Pulse 62   Temp 98.4 F (36.9 C) (Temporal)   Ht 5\' 1"  (1.549 m)   Wt 136 lb (61.7 kg)   SpO2 99%   BMI 25.70 kg/m         Assessment & Plan:  Leslie Ritter in today with chief complaint of Elbow Pain (right)    1. Medial epicondylitis of right elbow Meds ordered this encounter  Medications  . predniSONE (DELTASONE) 20 MG tablet    Sig: 2 po at sametime daily for 5 days    Dispense:  10 tablet    Refill:  0    Order Specific Question:   Supervising Provider    Answer:   Caryl Pina A [id number]   Wear tennis elbow straps all the time. Rest when can RTO prn  Mary-Margaret Hassell Done, FNP

## 2018-10-22 NOTE — Patient Instructions (Signed)
Tennis Elbow Tennis elbow is swelling (inflammation) in your outer forearm, near your elbow. Swelling affects the tissues that connect muscle to bone (tendons). Tennis elbow can happen in any sport or job in which you use your elbow too much. It is caused by doing the same motion over and over. Tennis elbow can cause:  Pain and tenderness in your forearm and the outer part of your elbow. You may have pain all the time, or only when using the arm.  A burning feeling. This runs from your elbow through your arm.  Weak grip in your hand. Follow these instructions at home: Activity  Rest your elbow and wrist. Avoid activities that cause problems, as told by your doctor.  If told by your doctor, wear an elbow strap to reduce stress on the area.  Do physical therapy exercises as told.  If you lift an object, lift it with your palm facing up. This is easier on your elbow. Lifestyle  If your tennis elbow is caused by sports, check your equipment and make sure that: ? You are using it correctly. ? It fits you well.  If your tennis elbow is caused by work or by using a computer, take breaks often to stretch your arm. Talk with your manager about how you can manage your condition at work. If you have a brace:  Wear the brace as told by your doctor. Remove it only as told by your doctor.  Loosen the brace if your fingers tingle, get numb, or turn cold and blue.  Keep the brace clean.  If the brace is not waterproof, ask your doctor if you may take the brace off for bathing. If you must keep the brace on while bathing: ? Do not let it get wet. ? Cover it with a watertight covering when you take a bath or a shower. General instructions   If told, put ice on the painful area: ? Put ice in a plastic bag. ? Place a towel between your skin and the bag. ? Leave the ice on for 20 minutes, 2-3 times a day.  Take over-the-counter and prescription medicines only as told by your doctor.  Keep  all follow-up visits as told by your doctor. This is important. Contact a doctor if:  Your pain does not get better with treatment.  Your pain gets worse.  You have weakness in your forearm, hand, or fingers.  You cannot feel your forearm, hand, or fingers. Summary  Tennis elbow is swelling (inflammation) in your outer forearm, near your elbow.  Tennis elbow is caused by doing the same motion over and over.  Rest your elbow and wrist. Avoid activities that cause problems, as told by your doctor.  If told, put ice on the painful area for 20 minutes, 2-3 times a day. This information is not intended to replace advice given to you by your health care provider. Make sure you discuss any questions you have with your health care provider. Document Released: 06/26/2009 Document Revised: 10/02/2017 Document Reviewed: 10/21/2016 Elsevier Patient Education  2020 [employer] Inc[employer]  

## 2019-03-13 IMAGING — MG DIGITAL SCREENING BILATERAL MAMMOGRAM WITH TOMO AND CAD
8 series · 9 of 24 positions shown · non-contrast
Comparison: Previous exam(s).

CLINICAL DATA: Screening.

EXAM:
DIGITAL SCREENING BILATERAL MAMMOGRAM WITH TOMO AND CAD

[R CC synth-2D]
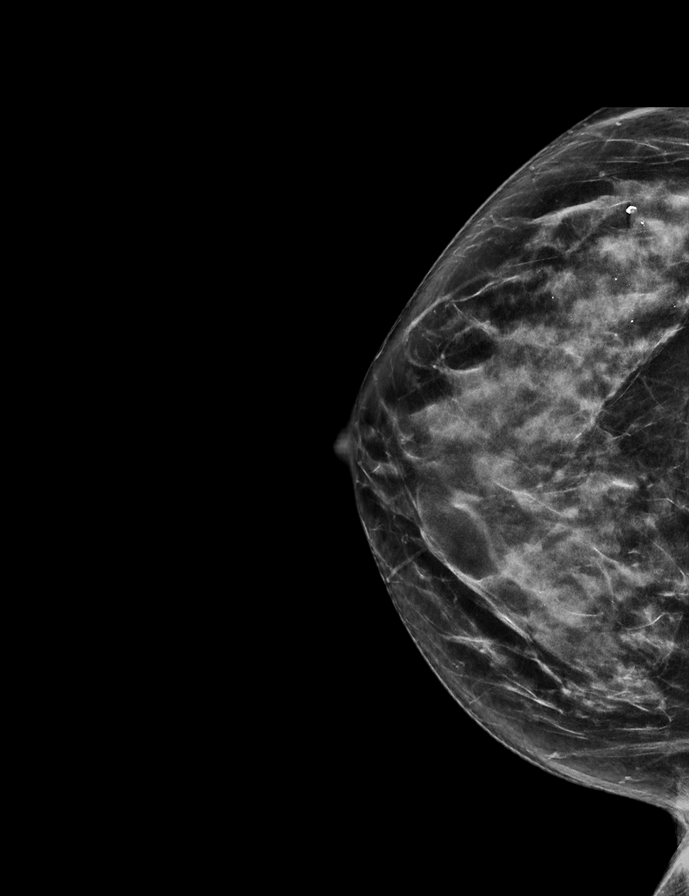

[L CC synth-2D]
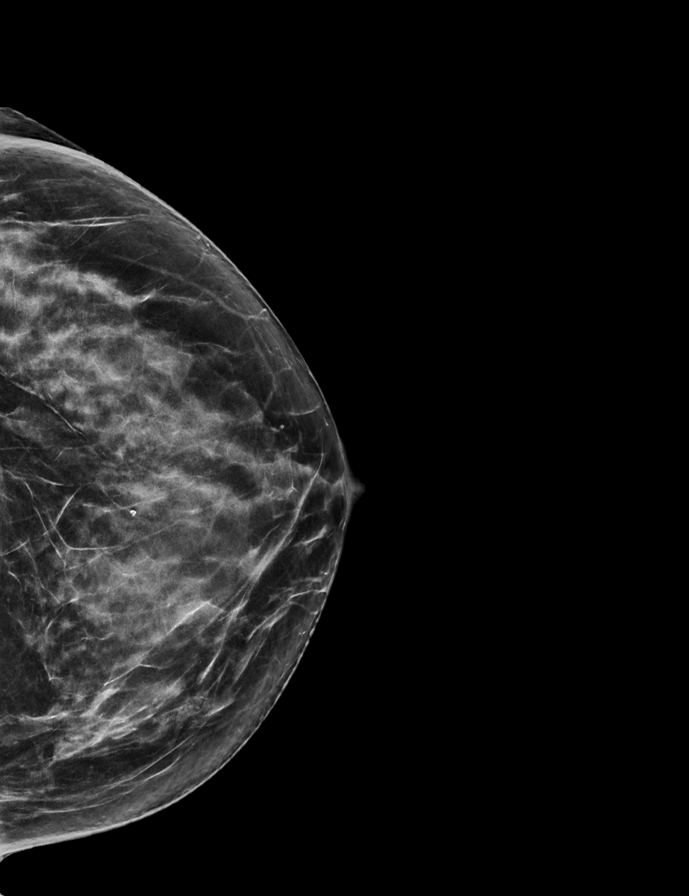

[L MLO synth-2D]
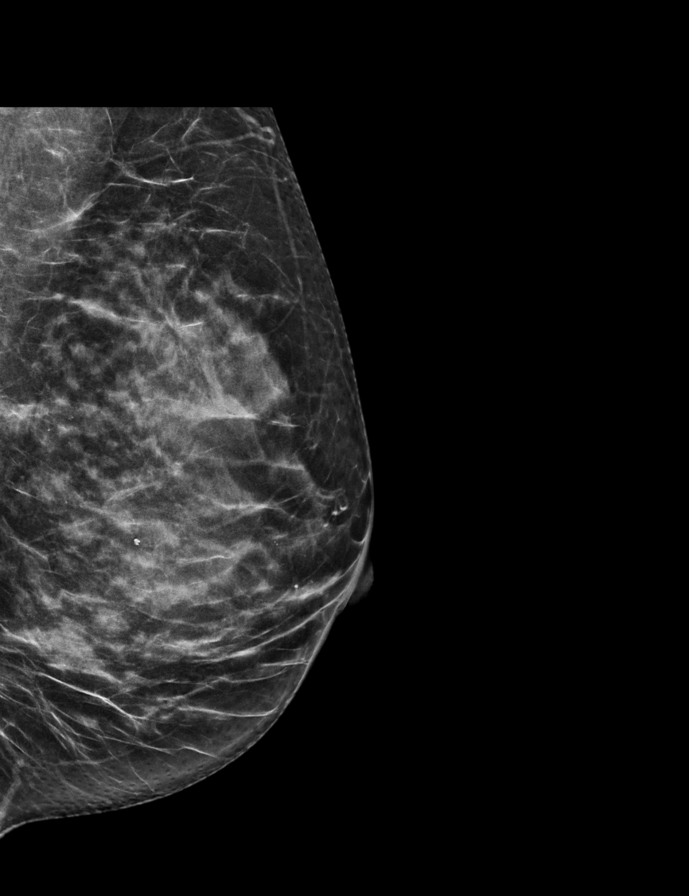

[R MLO synth-2D]
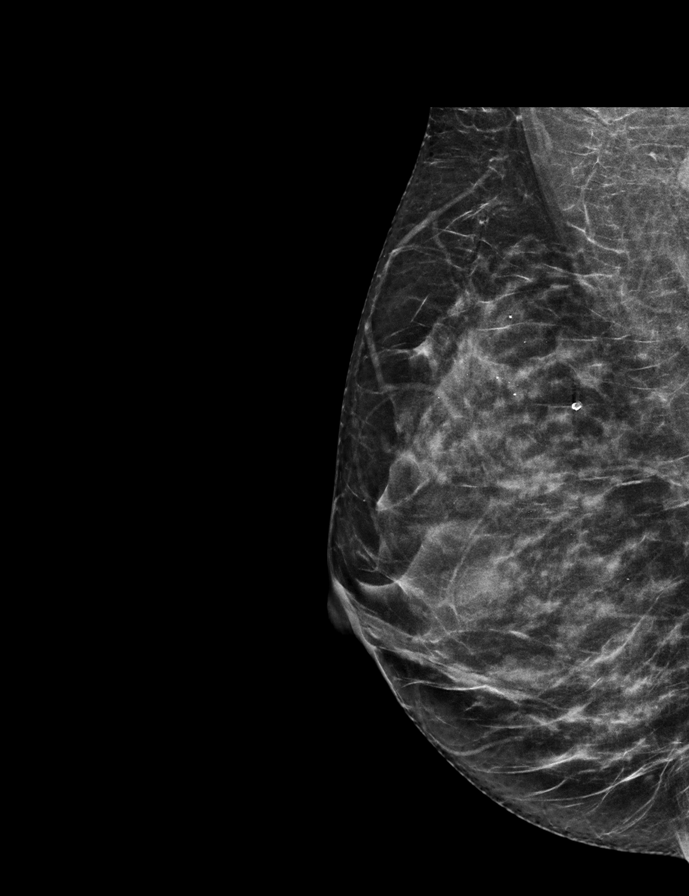

[L MLO tomo · 2 of 55 frames shown]
[frame 18/55]
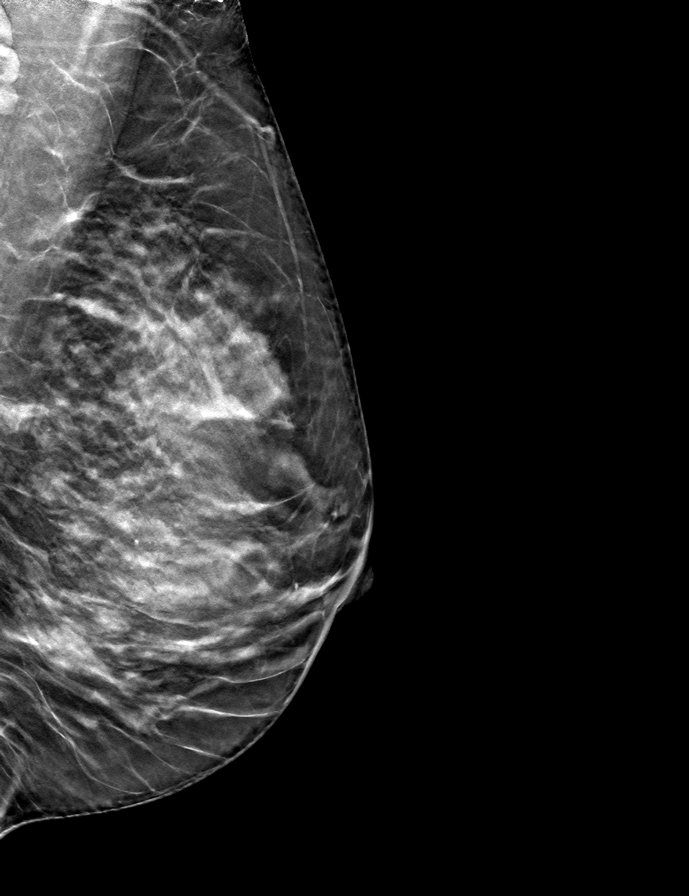
[frame 28/55]
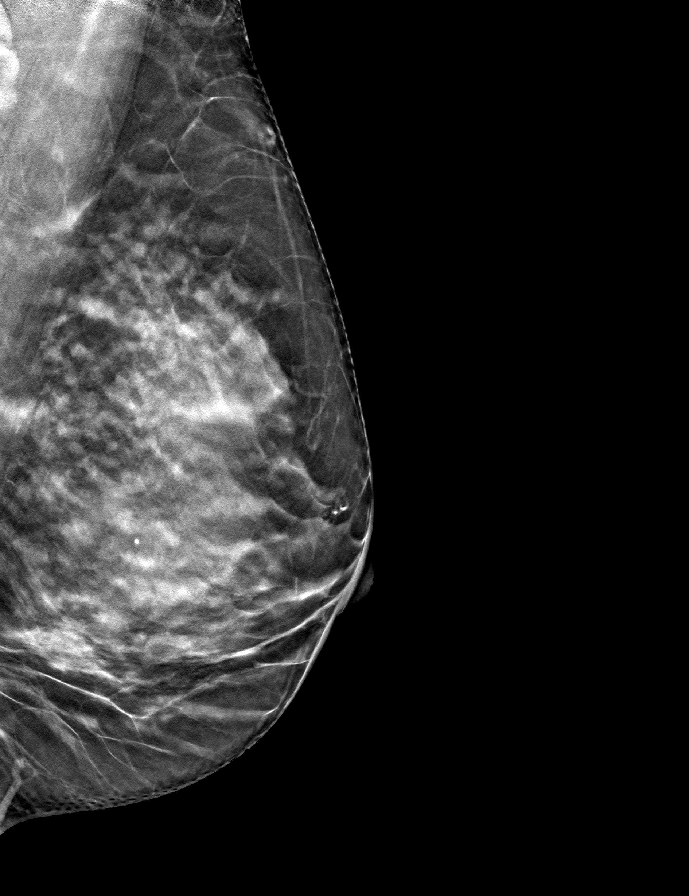

[L CC tomo · tomo slice 31/61.0]
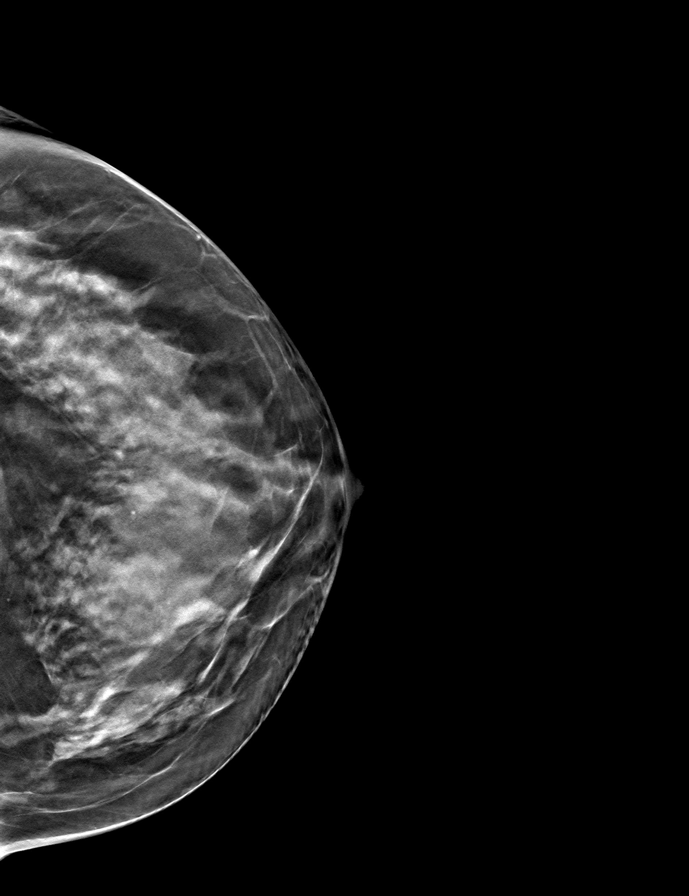

[R CC tomo · tomo slice 32/63.0]
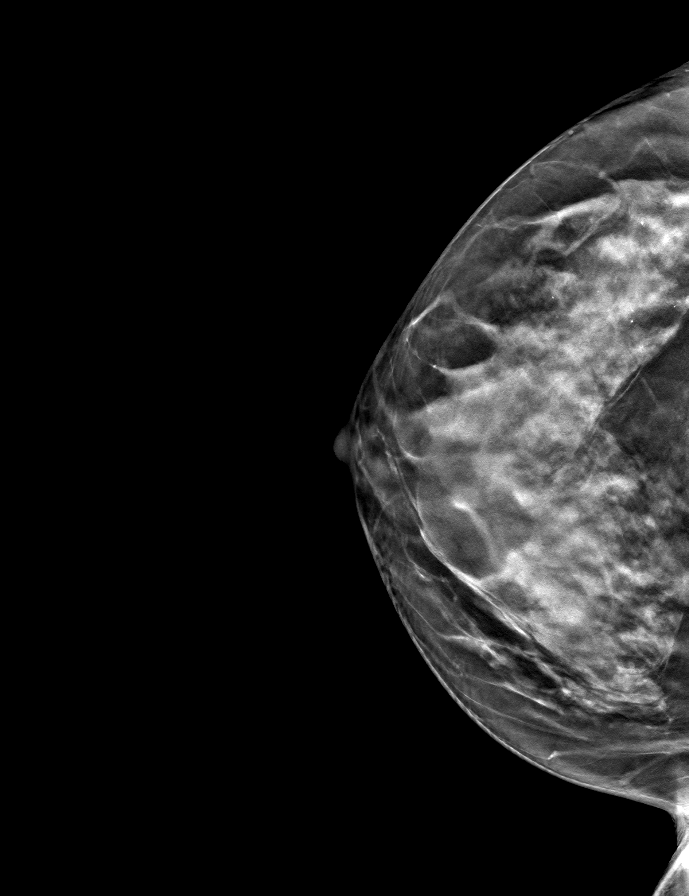

[R MLO tomo · tomo slice 30/59.0]
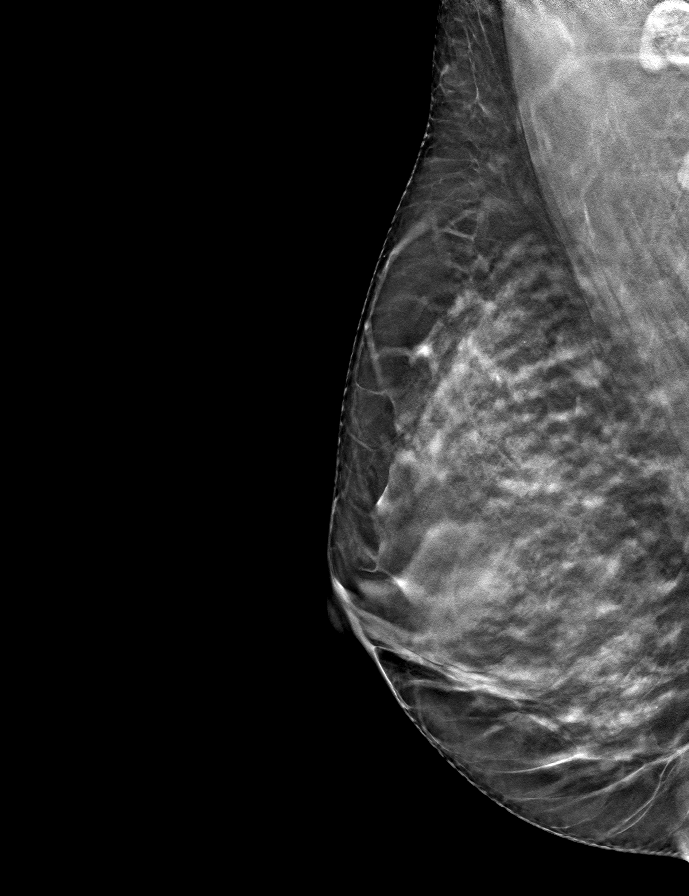

[9 of 24 positions shown; findings below may reference images not displayed]

ACR Breast Density Category c: The breast tissue is heterogeneously
dense, which may obscure small masses.
FINDINGS: There are no findings suspicious for malignancy. Images were
processed with CAD.
IMPRESSION: No mammographic evidence of malignancy. A result letter of this
screening mammogram will be mailed directly to the patient.

RECOMMENDATION:
Screening mammogram in one year. (Code:[id number])

BI-RADS CATEGORY  1: Negative.

## 2019-03-14 ENCOUNTER — Ambulatory Visit: Payer: Self-pay | Admitting: Nurse Practitioner

## 2019-03-17 ENCOUNTER — Ambulatory Visit: Payer: Self-pay | Admitting: Nurse Practitioner

## 2019-03-18 ENCOUNTER — Other Ambulatory Visit: Payer: Self-pay

## 2019-03-21 ENCOUNTER — Other Ambulatory Visit: Payer: Self-pay

## 2019-03-21 ENCOUNTER — Ambulatory Visit: Payer: 59 | Admitting: Nurse Practitioner

## 2019-03-21 ENCOUNTER — Encounter: Payer: Self-pay | Admitting: Nurse Practitioner

## 2019-03-21 VITALS — BP 118/81 | HR 70 | Temp 98.9°F | Ht 61.0 in | Wt 137.0 lb

## 2019-03-21 DIAGNOSIS — I10 Essential (primary) hypertension: Secondary | ICD-10-CM | POA: Diagnosis not present

## 2019-03-21 DIAGNOSIS — Z23 Encounter for immunization: Secondary | ICD-10-CM

## 2019-03-21 DIAGNOSIS — Z6826 Body mass index (BMI) 26.0-26.9, adult: Secondary | ICD-10-CM

## 2019-03-21 DIAGNOSIS — F3342 Major depressive disorder, recurrent, in full remission: Secondary | ICD-10-CM | POA: Diagnosis not present

## 2019-03-21 DIAGNOSIS — F411 Generalized anxiety disorder: Secondary | ICD-10-CM

## 2019-03-21 MED ORDER — ESCITALOPRAM OXALATE 20 MG PO TABS: 20.0000 mg | ORAL_TABLET | Freq: Every day | ORAL | 1 refills | Status: DC

## 2019-03-21 MED ORDER — LISINOPRIL-HYDROCHLOROTHIAZIDE 20-12.5 MG PO TABS: 1.0000 | ORAL_TABLET | Freq: Every day | ORAL | 1 refills | Status: DC

## 2019-03-21 MED ORDER — CLONAZEPAM 0.5 MG PO TABS: 0.5000 mg | ORAL_TABLET | Freq: Two times a day (BID) | ORAL | 5 refills | Status: DC | PRN

## 2019-03-21 NOTE — Progress Notes (Signed)
Subjective:    Patient ID: Leslie Ritter, female    DOB: Feb 10, 1970, 49 y.o.   MRN: [id number]   Chief Complaint: Medical Management of Chronic Issues    HPI:  1. Essential hypertension No c/o chest pain, sob or headache. Dos not check blood pressure at home. BP Readings from Last 3 Encounters:  10/22/18 122/85  09/09/18 115/81  09/03/18 108/77     2. Recurrent major depressive disorder, in full remission ([place]) Is on lexapro dialy and is doing well. No medication side effects. Depression screen [facility] 2/9 03/21/2019 10/22/2018 09/09/2018  Decreased Interest 0 0 0  Down, Depressed, Hopeless 0 0 0  PHQ - 2 Score 0 0 0  Altered sleeping - - -  Tired, decreased energy - - -  Change in appetite - - -  Feeling bad or failure about yourself  - - -  Trouble concentrating - - -  Moving slowly or fidgety/restless - - -  Suicidal thoughts - - -  PHQ-9 Score - - -  Difficult doing work/chores - - -     3. GAD (generalized anxiety disorder) Is on klonopin bid.  GAD 7 : Generalized Anxiety Score 03/21/2019 11/30/2017 05/28/2017 05/09/2016  Nervous, Anxious, on Edge _0 Control/stop worrying _1 Worry too much - different things 0 _2 Trouble relaxing 0 _3 Restless 0 1 2 0  Easily annoyed or irritable 0 _4 Afraid - awful might happen 0 0 0 2  Total GAD 7 Score _5 Anxiety Difficulty Somewhat difficult Not difficult at all Somewhat difficult Very difficult      4. BMI 26.0-26.9,adult No recent weight changes. Wt Readings from Last 3 Encounters:  10/22/18 136 lb (61.7 kg)  09/09/18 138 lb (62.6 kg)  09/03/18 136 lb 12.8 oz (62.1 kg)   BMI Readings from Last 3 Encounters:  10/22/18 25.70 kg/m  09/09/18 26.07 kg/m  09/03/18 25.85 kg/m       Outpatient Encounter Medications as of 03/21/2019  Medication Sig  . clonazePAM (KLONOPIN) 0.5 MG tablet Take 1 tablet (0.5 mg total) by mouth 2 (two) times daily as needed for anxiety.  Marland Kitchen escitalopram  (LEXAPRO) 20 MG tablet Take 1 tablet (20 mg total) by mouth daily.  Marland Kitchen lisinopril-hydrochlorothiazide (ZESTORETIC) 20-12.5 MG tablet Take 1 tablet by mouth daily.  . predniSONE (DELTASONE) 20 MG tablet 2 po at sametime daily for 5 days  . valACYclovir (VALTREX) 500 MG tablet Take 1 tablet (500 mg total) by mouth 2 (two) times daily.     Past Surgical History:  Procedure Laterality Date  . GYNECOLOGIC CRYOSURGERY  1992  . LAPAROSCOPIC TUBAL LIGATION Bilateral 03/30/2012   Procedure: LAPAROSCOPIC TUBAL LIGATION;  Surgeon: Anastasio Auerbach, MD;  Location: [facility];  Service: Gynecology;  Laterality: Bilateral;  . mole removed     face - left cheek  . WISDOM TOOTH EXTRACTION      Family History  Problem Relation Age of Onset  . Hypertension Mother   . Hypertension Sister   . Diabetes Maternal Grandmother   . Hypertension Maternal Grandfather   . Heart disease Maternal Grandfather     New complaints: None today  Social history: Had surgery on her left forearm 2 weeks ago.  Controlled substance contract: 03/21/19    Review of Systems  Constitutional: Negative for diaphoresis.  Eyes: Negative for pain.  Respiratory: Negative for shortness  of breath.   Cardiovascular: Negative for chest pain, palpitations and leg swelling.  Gastrointestinal: Negative for abdominal pain.  Endocrine: Negative for polydipsia.  Skin: Negative for rash.  Neurological: Negative for dizziness, weakness and headaches.  Hematological: Does not bruise/bleed easily.  All other systems reviewed and are negative.      Objective:   Physical Exam Vitals and nursing note reviewed.  Constitutional:      General: She is not in acute distress.    Appearance: Normal appearance. She is well-developed.  HENT:     Head: Normocephalic.     Nose: Nose normal.  Eyes:     Pupils: Pupils are equal, round, and reactive to light.  Neck:     Vascular: No carotid bruit or JVD.  Cardiovascular:     Rate and  Rhythm: Normal rate and regular rhythm.     Heart sounds: Normal heart sounds.  Pulmonary:     Effort: Pulmonary effort is normal. No respiratory distress.     Breath sounds: Normal breath sounds. No wheezing or rales.  Chest:     Chest wall: No tenderness.  Abdominal:     General: Bowel sounds are normal. There is no distension or abdominal bruit.     Palpations: Abdomen is soft. There is no hepatomegaly, splenomegaly, mass or pulsatile mass.     Tenderness: There is no abdominal tenderness.  Musculoskeletal:        General: Normal range of motion.     Cervical back: Normal range of motion and neck supple.  Lymphadenopathy:     Cervical: No cervical adenopathy.  Skin:    General: Skin is warm and dry.  Neurological:     Mental Status: She is alert and oriented to person, place, and time.     Deep Tendon Reflexes: Reflexes are normal and symmetric.  Psychiatric:        Behavior: Behavior normal.        Thought Content: Thought content normal.        Judgment: Judgment normal.    BP 118/81   Pulse 70   Temp 98.9 F (37.2 C) (Oral)   Ht _0  (1.549 m)   Wt 137 lb (62.1 kg)   BMI 25.89 kg/m        Assessment & Plan:  Leslie Ritter comes in today with chief complaint of Medical Management of Chronic Issues   Diagnosis and orders addressed:  1. Essential hypertension Low sodium diet - lisinopril-hydrochlorothiazide (ZESTORETIC) 20-12.5 MG tablet; Take 1 tablet by mouth daily.  Dispense: 90 tablet; Refill: 1 - CMP14+EGFR - Lipid panel - CBC with Differential/Platelet  2. Recurrent major depressive disorder, in full remission [facility]) Stress management  3. GAD (generalized anxiety disorder) Stress ,management - escitalopram (LEXAPRO) 20 MG tablet; Take 1 tablet (20 mg total) by mouth daily.  Dispense: 90 tablet; Refill: 1 - clonazePAM (KLONOPIN) 0.5 MG tablet; Take 1 tablet (0.5 mg total) by mouth 2 (two) times daily as needed for anxiety.  Dispense: 60 tablet;  Refill: 5  4. BMI 26.0-26.9,adult Discussed diet and exercise for person with BMI >25 Will recheck weight in 3-6 months   Labs pending Health Maintenance reviewed Diet and exercise encouraged  Follow up plan: 6 months   Mary-Margaret Hassell Done, FNP

## 2019-03-21 NOTE — Addendum Note (Signed)
Addended by: Rolena Infante on: 03/21/2019 03:29 PM   Modules accepted: Orders

## 2019-03-21 NOTE — Patient Instructions (Signed)
Stress, Adult Stress is a normal reaction to life events. Stress is what you feel when life demands more than you are used to, or more than you think you can handle. Some stress can be useful, such as studying for a test or meeting a deadline at work. Stress that occurs too often or for too long can cause problems. It can affect your emotional health and interfere with relationships and normal daily activities. Too much stress can weaken your body's defense system (immune system) and increase your risk for physical illness. If you already have a medical problem, stress can make it worse. What are the causes? All sorts of life events can cause stress. An event that causes stress for one person may not be stressful for another person. Major life events, whether positive or negative, commonly cause stress. Examples include:  Losing a job or starting a new job.  Losing a loved one.  Moving to a new town or home.  Getting married or divorced.  Having a baby.  Getting injured or sick. Less obvious life events can also cause stress, especially if they occur day after day or in combination with each other. Examples include:  Working long hours.  Driving in traffic.  Caring for children.  Being in debt.  Being in a difficult relationship. What are the signs or symptoms? Stress can cause emotional symptoms, including:  Anxiety. This is feeling worried, afraid, on edge, overwhelmed, or out of control.  Anger, including irritation or impatience.  Depression. This is feeling sad, down, helpless, or guilty.  Trouble focusing, remembering, or making decisions. Stress can cause physical symptoms, including:  Aches and pains. These may affect your head, neck, back, stomach, or other areas of your body.  Tight muscles or a clenched jaw.  Low energy.  Trouble sleeping. Stress can cause unhealthy behaviors, including:  Eating to feel better (overeating) or skipping meals.  Working too  much or putting off tasks.  Smoking, drinking alcohol, or using drugs to feel better. How is this diagnosed? Stress is diagnosed through an assessment by your health care provider. He or she may diagnose this condition based on:  Your symptoms and any stressful life events.  Your medical history.  Tests to rule out other causes of your symptoms. Depending on your condition, your health care provider may refer you to a specialist for further evaluation. How is this treated?  Stress management techniques are the recommended treatment for stress. Medicine is not typically recommended for the treatment of stress. Techniques to reduce your reaction to stressful life events include:  Stress identification. Monitor yourself for symptoms of stress and identify what causes stress for you. These skills may help you to avoid or prepare for stressful events.  Time management. Set your priorities, keep a calendar of events, and learn to say no. Taking these actions can help you avoid making too many commitments. Techniques for coping with stress include:  Rethinking the problem. Try to think realistically about stressful events rather than ignoring them or overreacting. Try to find the positives in a stressful situation rather than focusing on the negatives.  Exercise. Physical exercise can release both physical and emotional tension. The key is to find a form of exercise that you enjoy and do it regularly.  Relaxation techniques. These relax the body and mind. The key is to find one or more that you enjoy and use the techniques regularly. Examples include: ? Meditation, deep breathing, or progressive relaxation techniques. ? Yoga or   tai chi. ? Biofeedback, mindfulness techniques, or journaling. ? Listening to music, being out in nature, or participating in other hobbies.  Practicing a healthy lifestyle. Eat a balanced diet, drink plenty of water, limit or avoid caffeine, and get plenty of  sleep.  Having a strong support network. Spend time with family, friends, or other people you enjoy being around. Express your feelings and talk things over with someone you trust. Counseling or talk therapy with a mental health professional may be helpful if you are having trouble managing stress on your own. Follow these instructions at home: Lifestyle   Avoid drugs.  Do not use any products that contain nicotine or tobacco, such as cigarettes, e-cigarettes, and chewing tobacco. If you need help quitting, ask your health care provider.  Limit alcohol intake to no more than 1 drink a day for nonpregnant women and 2 drinks a day for men. One drink equals 12 oz of beer, 5 oz of wine, or 1 oz of hard liquor  Do not use alcohol or drugs to relax.  Eat a balanced diet that includes fresh fruits and vegetables, whole grains, lean meats, fish, eggs, and beans, and low-fat dairy. Avoid processed foods and foods high in added fat, sugar, and salt.  Exercise at least 30 minutes on 5 or more days each week.  Get 7-8 hours of sleep each night. General instructions   Practice stress management techniques as discussed with your health care provider.  Drink enough fluid to keep your urine clear or pale yellow.  Take over-the-counter and prescription medicines only as told by your health care provider.  Keep all follow-up visits as told by your health care provider. This is important. Contact a health care provider if:  Your symptoms get worse.  You have new symptoms.  You feel overwhelmed by your problems and can no longer manage them on your own. Get help right away if:  You have thoughts of hurting yourself or others. If you ever feel like you may hurt yourself or others, or have thoughts about taking your own life, get help right away. You can go to your nearest emergency department or call:  Your local emergency services (911 in the [place]  A suicide crisis helpline, such as the  [place] at (316) 250-6172. This is open 24 hours a day. Summary  Stress is a normal reaction to life events. It can cause problems if it happens too often or for too long.  Practicing stress management techniques is the best way to treat stress.  Counseling or talk therapy with a mental health professional may be helpful if you are having trouble managing stress on your own. This information is not intended to replace advice given to you by your health care provider. Make sure you discuss any questions you have with your health care provider. Document Revised: 08/06/2018 Document Reviewed: 02/27/2016 Elsevier Patient Education  [place].

## 2019-03-22 LAB — CMP14+EGFR
ALT: 16 IU/L (ref 0–32)
AST: 18 IU/L (ref 0–40)
Albumin/Globulin Ratio: 2 (ref 1.2–2.2)
Albumin: 4.7 g/dL (ref 3.8–4.8)
Alkaline Phosphatase: 47 IU/L (ref 39–117)
BUN/Creatinine Ratio: 22 (ref 9–23)
BUN: 13 mg/dL (ref 6–24)
Bilirubin Total: 0.4 mg/dL (ref 0.0–1.2)
CO2: 24 mmol/L (ref 20–29)
Calcium: 9.5 mg/dL (ref 8.7–10.2)
Chloride: 101 mmol/L (ref 96–106)
Creatinine, Ser: 0.58 mg/dL (ref 0.57–1.00)
GFR calc Af Amer: 125 mL/min/{1.73_m2} (ref >59)
GFR calc non Af Amer: 109 mL/min/{1.73_m2} (ref >59)
Globulin, Total: 2.4 g/dL (ref 1.5–4.5)
Glucose: 95 mg/dL (ref 65–99)
Potassium: 4.3 mmol/L (ref 3.5–5.2)
Sodium: 142 mmol/L (ref 134–144)
Total Protein: 7.1 g/dL (ref 6.0–8.5)

## 2019-03-22 LAB — CBC WITH DIFFERENTIAL/PLATELET
Basophils Absolute: 0 10*3/uL (ref 0.0–0.2)
Basos: 0 %
EOS (ABSOLUTE): 0.1 10*3/uL (ref 0.0–0.4)
Eos: 2 %
Hematocrit: 41.2 % (ref 34.0–46.6)
Hemoglobin: 14.5 g/dL (ref 11.1–15.9)
Immature Grans (Abs): 0 10*3/uL (ref 0.0–0.1)
Immature Granulocytes: 0 %
Lymphocytes Absolute: 1.7 10*3/uL (ref 0.7–3.1)
Lymphs: 25 %
MCH: 32.7 pg (ref 26.6–33.0)
MCHC: 35.2 g/dL (ref 31.5–35.7)
MCV: 93 fL (ref 79–97)
Monocytes Absolute: 0.7 10*3/uL (ref 0.1–0.9)
Monocytes: 10 %
Neutrophils Absolute: 4.3 10*3/uL (ref 1.4–7.0)
Neutrophils: 63 %
Platelets: 314 10*3/uL (ref 150–450)
RBC: 4.44 x10E6/uL (ref 3.77–5.28)
RDW: 12.3 % (ref 11.7–15.4)
WBC: 6.8 10*3/uL (ref 3.4–10.8)

## 2019-03-22 LAB — LIPID PANEL
Chol/HDL Ratio: 1.9 ratio (ref 0.0–4.4)
Cholesterol, Total: 189 mg/dL (ref 100–199)
HDL: 100 mg/dL (ref >39)
LDL Chol Calc (NIH): 78 mg/dL (ref 0–99)
Triglycerides: 55 mg/dL (ref 0–149)
VLDL Cholesterol Cal: 11 mg/dL (ref 5–40)

## 2019-03-30 ENCOUNTER — Other Ambulatory Visit: Payer: Self-pay | Admitting: Women's Health

## 2019-03-30 DIAGNOSIS — Z1231 Encounter for screening mammogram for malignant neoplasm of breast: Secondary | ICD-10-CM

## 2019-04-12 ENCOUNTER — Encounter: Payer: 59 | Admitting: Women's Health

## 2019-04-12 ENCOUNTER — Other Ambulatory Visit: Payer: Self-pay

## 2019-04-13 ENCOUNTER — Encounter: Payer: Self-pay | Admitting: Women's Health

## 2019-04-13 ENCOUNTER — Ambulatory Visit (INDEPENDENT_AMBULATORY_CARE_PROVIDER_SITE_OTHER): Payer: 59 | Admitting: Women's Health

## 2019-04-13 VITALS — BP 110/78 | Ht 61.0 in | Wt 134.0 lb

## 2019-04-13 DIAGNOSIS — B009 Herpesviral infection, unspecified: Secondary | ICD-10-CM | POA: Diagnosis not present

## 2019-04-13 DIAGNOSIS — Z01419 Encounter for gynecological examination (general) (routine) without abnormal findings: Secondary | ICD-10-CM

## 2019-04-13 MED ORDER — VALACYCLOVIR HCL 500 MG PO TABS: ORAL_TABLET | ORAL | 12 refills | Status: DC

## 2019-04-13 NOTE — Addendum Note (Signed)
Addended by: Lorine Bears on: 04/13/2019 01:56 PM   Modules accepted: Orders

## 2019-04-13 NOTE — Patient Instructions (Addendum)
Vit D 2000 It has been a pleasure knowing you!!  Health Maintenance, Female Adopting a healthy lifestyle and getting preventive care are important in promoting health and wellness. Ask your health care provider about:  The right schedule for you to have regular tests and exams.  Things you can do on your own to prevent diseases and keep yourself healthy. What should I know about diet, weight, and exercise? Eat a healthy diet   Eat a diet that includes plenty of vegetables, fruits, low-fat dairy products, and lean protein.  Do not eat a lot of foods that are high in solid fats, added sugars, or sodium. Maintain a healthy weight Body mass index (BMI) is used to identify weight problems. It estimates body fat based on height and weight. Your health care provider can help determine your BMI and help you achieve or maintain a healthy weight. Get regular exercise Get regular exercise. This is one of the most important things you can do for your health. Most adults should:  Exercise for at least 150 minutes each week. The exercise should increase your heart rate and make you sweat (moderate-intensity exercise).  Do strengthening exercises at least twice a week. This is in addition to the moderate-intensity exercise.  Spend less time sitting. Even light physical activity can be beneficial. Watch cholesterol and blood lipids Have your blood tested for lipids and cholesterol at 50 years of age, then have this test every 5 years. Have your cholesterol levels checked more often if:  Your lipid or cholesterol levels are high.  You are older than 50 years of age.  You are at high risk for heart disease. What should I know about cancer screening? Depending on your health history and family history, you may need to have cancer screening at various ages. This may include screening for:  Breast cancer.  Cervical cancer.  Colorectal cancer.  Skin cancer.  Lung cancer. What should I know  about heart disease, diabetes, and high blood pressure? Blood pressure and heart disease  High blood pressure causes heart disease and increases the risk of stroke. This is more likely to develop in people who have high blood pressure readings, are of African descent, or are overweight.  Have your blood pressure checked: ? Every 3-5 years if you are 88-69 years of age. ? Every year if you are 14 years old or older. Diabetes Have regular diabetes screenings. This checks your fasting blood sugar level. Have the screening done:  Once every three years after age 28 if you are at a normal weight and have a low risk for diabetes.  More often and at a younger age if you are overweight or have a high risk for diabetes. What should I know about preventing infection? Hepatitis B If you have a higher risk for hepatitis B, you should be screened for this virus. Talk with your health care provider to find out if you are at risk for hepatitis B infection. Hepatitis C Testing is recommended for:  Everyone born from [birth year] through 1966.  Anyone with known risk factors for hepatitis C. Sexually transmitted infections (STIs)  Get screened for STIs, including gonorrhea and chlamydia, if: ? You are sexually active and are younger than 50 years of age. ? You are older than 50 years of age and your health care provider tells you that you are at risk for this type of infection. ? Your sexual activity has changed since you were last screened, and you are at increased  risk for chlamydia or gonorrhea. Ask your health care provider if you are at risk.  Ask your health care provider about whether you are at high risk for HIV. Your health care provider may recommend a prescription medicine to help prevent HIV infection. If you choose to take medicine to prevent HIV, you should first get tested for HIV. You should then be tested every 3 months for as long as you are taking the medicine. Pregnancy  If you are about  to stop having your period (premenopausal) and you may become pregnant, seek counseling before you get pregnant.  Take 400 to 800 micrograms (mcg) of folic acid every day if you become pregnant.  Ask for birth control (contraception) if you want to prevent pregnancy. Osteoporosis and menopause Osteoporosis is a disease in which the bones lose minerals and strength with aging. This can result in bone fractures. If you are 67 years old or older, or if you are at risk for osteoporosis and fractures, ask your health care provider if you should:  Be screened for bone loss.  Take a calcium or vitamin D supplement to lower your risk of fractures.  Be given hormone replacement therapy (HRT) to treat symptoms of menopause. Follow these instructions at home: Lifestyle  Do not use any products that contain nicotine or tobacco, such as cigarettes, e-cigarettes, and chewing tobacco. If you need help quitting, ask your health care provider.  Do not use street drugs.  Do not share needles.  Ask your health care provider for help if you need support or information about quitting drugs. Alcohol use  Do not drink alcohol if: ? Your health care provider tells you not to drink. ? You are pregnant, may be pregnant, or are planning to become pregnant.  If you drink alcohol: ? Limit how much you use to 0-1 drink a day. ? Limit intake if you are breastfeeding.  Be aware of how much alcohol is in your drink. In the [place] one drink equals one 12 oz bottle of beer (355 mL), one 5 oz glass of wine (148 mL), or one 1 oz glass of hard liquor (44 mL). General instructions  Schedule regular health, dental, and eye exams.  Stay current with your vaccines.  Tell your health care provider if: ? You often feel depressed. ? You have ever been abused or do not feel safe at home. Summary  Adopting a healthy lifestyle and getting preventive care are important in promoting health and wellness.  Follow your  health care provider's instructions about healthy diet, exercising, and getting tested or screened for diseases.  Follow your health care provider's instructions on monitoring your cholesterol and blood pressure. This information is not intended to replace advice given to you by your health care provider. Make sure you discuss any questions you have with your health care provider. Document Revised: 12/30/2017 Document Reviewed: 12/30/2017 Elsevier Patient Education  2020 [employer].

## 2019-04-13 NOTE — Progress Notes (Signed)
Leslie Ritter 05/17/1970 [id number]    History:    Presents for annual exam.  Monthly cycle/BTL.  Has had an occasional midcycle this past year.  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  Primary care manages hypertension and anxiety stable on Lexapro.  Normal mammogram history.  Had right radial nerve surgery this past year.  HSV-1 rare outbreaks.  Past medical history, past surgical history, family history and social history were all reviewed and documented in the EPIC chart.  Works at an Production [employer], [employer].  Leslie Ritter 24 married this past year.  Stepdaughter pregnant.  Mother, sister hypertension.  ROS:  A ROS was performed and pertinent positives and negatives are included.  Exam:  Vitals:   04/13/19 1214  BP: 110/78  Weight: 134 lb (60.8 kg)  Height: 5\' 1"  (1.549 m)   Body mass index is 25.32 kg/m.   General appearance:  Normal Thyroid:  Symmetrical, normal in size, without palpable masses or nodularity. Respiratory  Auscultation:  Clear without wheezing or rhonchi Cardiovascular  Auscultation:  Regular rate, without rubs, murmurs or gallops  Edema/varicosities:  Not grossly evident Abdominal  Soft,nontender, without masses, guarding or rebound.  Liver/spleen:  No organomegaly noted  Hernia:  None appreciated  Skin  Inspection:  Grossly normal   Breasts: Examined lying and sitting.     Right: Without masses, retractions, discharge or axillary adenopathy.     Left: Without masses, retractions, discharge or axillary adenopathy. Gentitourinary   Inguinal/mons:  Normal without inguinal adenopathy  External genitalia:  Normal  BUS/Urethra/Skene's glands:  Normal  Vagina:  Normal  Cervix:  Normal  Uterus:  normal in size, shape and contour.  Midline and mobile  Adnexa/parametria:     Rt: Without masses or tenderness.   Lt: Without masses or tenderness.  Anus and perineum: Normal  Digital rectal exam: Normal sphincter tone without palpated masses or  tenderness  Assessment/Plan:  49 y.o. engaged WF G1 P1 +2 stepchildren for annual exam with no complaints.  Monthly cycle/BTL 2008 LGSIL cryo, normal Paps after that Hypertension, anxiety/depression-primary care manages labs and meds  Plan: SBEs, continue annual screening mammogram has scheduled in June.  Aware of importance of exercise, calcium rich foods, vitamin D 2000 IUs daily encouraged.  Rare menopausal symptoms, menopause discussed.  Valtrex 500 twice daily for 3 to 5 days as needed prescription, proper use given and reviewed.  Aware of need for self-care, leisure activities, counseling as needed.  Congratulated on 10 pound weight loss with diet and exercise.  Pap per request, Pap normal 2020.    Leslie Ritter [facility], 12:31 PM 04/13/2019

## 2019-04-15 LAB — PAP IG W/ RFLX HPV ASCU

## 2019-04-19 ENCOUNTER — Telehealth: Payer: Self-pay | Admitting: *Deleted

## 2019-04-19 MED ORDER — SULFAMETHOXAZOLE-TRIMETHOPRIM 800-160 MG PO TABS: 1.0000 | ORAL_TABLET | Freq: Two times a day (BID) | ORAL | 0 refills | Status: DC

## 2019-04-19 NOTE — Telephone Encounter (Signed)
patient informed. Rx sent.

## 2019-04-19 NOTE — Telephone Encounter (Signed)
Please call and review I am sorry that the UA was overlooked.  Septra twice daily for 3 days office visit for UA if no relief.

## 2019-04-19 NOTE — Telephone Encounter (Signed)
Patient called requesting u/a results from annual visit on 04/13/19, I explained no urine was ordered at this visit. I explained if no urinary complaints u/a will not be checked. Patient c/o pain at the end of stream and pressure, taking azo this am to help with discomfort and drinking cranberry juice. She is working today asked if Rx could be sent to pharmacy? Aware OV best. Please advise

## 2019-04-21 ENCOUNTER — Other Ambulatory Visit: Payer: Self-pay | Admitting: Women's Health

## 2019-04-21 ENCOUNTER — Telehealth: Payer: Self-pay | Admitting: Nurse Practitioner

## 2019-04-21 NOTE — Telephone Encounter (Signed)
Pt called stating that she was recently seen for UTI and was prescribed an antibiotic to take. Says she has taken 2 days worth of the antibiotic already and says she felt good yesterday but this morning she is feeling pressure and pain again. Wants to know if she needs to make another appt or if a refill of the antibiotic can be sent in for her in case she needs it for the weekend.

## 2019-04-21 NOTE — Telephone Encounter (Signed)
Aware. Finish medication and drink lots of water.  Follow up with GYN doctor , who prescribed medicine.

## 2019-07-08 ENCOUNTER — Ambulatory Visit
Admission: RE | Admit: 2019-07-08 | Discharge: 2019-07-08 | Disposition: A | Discharge status: Home / Self Care | Payer: 59 | Source: Ambulatory Visit | Attending: Women's Health | Admitting: Women's Health

## 2019-07-08 ENCOUNTER — Other Ambulatory Visit: Payer: Self-pay

## 2019-07-08 DIAGNOSIS — Z1231 Encounter for screening mammogram for malignant neoplasm of breast: Secondary | ICD-10-CM

## 2019-09-22 ENCOUNTER — Ambulatory Visit: Payer: Self-pay | Admitting: Nurse Practitioner

## 2019-09-30 ENCOUNTER — Ambulatory Visit: Payer: Self-pay | Admitting: Nurse Practitioner

## 2019-10-13 ENCOUNTER — Other Ambulatory Visit: Payer: Self-pay

## 2019-10-13 ENCOUNTER — Encounter: Payer: Self-pay | Admitting: Nurse Practitioner

## 2019-10-13 ENCOUNTER — Ambulatory Visit: Payer: 59 | Admitting: Nurse Practitioner

## 2019-10-13 VITALS — BP 131/88 | HR 62 | Temp 97.8°F | Resp 20 | Ht 61.0 in | Wt 136.0 lb

## 2019-10-13 DIAGNOSIS — I10 Essential (primary) hypertension: Secondary | ICD-10-CM

## 2019-10-13 DIAGNOSIS — F411 Generalized anxiety disorder: Secondary | ICD-10-CM

## 2019-10-13 DIAGNOSIS — Z23 Encounter for immunization: Secondary | ICD-10-CM

## 2019-10-13 DIAGNOSIS — F3342 Major depressive disorder, recurrent, in full remission: Secondary | ICD-10-CM | POA: Diagnosis not present

## 2019-10-13 DIAGNOSIS — B009 Herpesviral infection, unspecified: Secondary | ICD-10-CM | POA: Diagnosis not present

## 2019-10-13 MED ORDER — CLONAZEPAM 0.5 MG PO TABS: 0.5000 mg | ORAL_TABLET | Freq: Two times a day (BID) | ORAL | 5 refills | Status: DC | PRN

## 2019-10-13 MED ORDER — VALACYCLOVIR HCL 500 MG PO TABS: ORAL_TABLET | ORAL | 12 refills | Status: DC

## 2019-10-13 MED ORDER — ESCITALOPRAM OXALATE 20 MG PO TABS: 20.0000 mg | ORAL_TABLET | Freq: Every day | ORAL | 1 refills | Status: DC

## 2019-10-13 MED ORDER — LISINOPRIL-HYDROCHLOROTHIAZIDE 20-12.5 MG PO TABS: 1.0000 | ORAL_TABLET | Freq: Every day | ORAL | 1 refills | Status: DC

## 2019-10-13 NOTE — Progress Notes (Signed)
Subjective:    Patient ID: Leslie Ritter, female    DOB: January 01, 1971, 49 y.o.   MRN: [id number]   Chief Complaint: Medical Management of Chronic Issues    HPI:  1. Essential hypertension No c/o chest pain, sob or headache. Does not check blood pressure at home. BP Readings from Last 3 Encounters:  04/13/19 110/78  03/21/19 118/81  10/22/18 122/85    2. Recurrent major depressive disorder, in full remission ([place]) Is currently on lexapro and is doing well. denies any medication side efects Depression screen [facility] 2/9 10/13/2019 03/21/2019 10/22/2018  Decreased Interest 0 0 0  Down, Depressed, Hopeless 0 0 0  PHQ - 2 Score 0 0 0  Altered sleeping 1 - -  Tired, decreased energy 1 - -  Change in appetite 0 - -  Feeling bad or failure about yourself  0 - -  Trouble concentrating 0 - -  Moving slowly or fidgety/restless 0 - -  Suicidal thoughts 0 - -  PHQ-9 Score 2 - -  Difficult doing work/chores Not difficult at all - -     3. GAD (generalized anxiety disorder) Is on klonopin 0.68m 2 times daily GAD 7 : Generalized Anxiety Score 10/13/2019 03/21/2019 11/30/2017 05/28/2017  Nervous, Anxious, on Edge _0 Control/stop worrying _1 Worry too much - different things 1 0 1 1  Trouble relaxing 1 0 1 2  Restless 1 0 1 2  Easily annoyed or irritable 0 0 1 1  Afraid - awful might happen 0 0 0 0  Total GAD 7 Score _2 Anxiety Difficulty Somewhat difficult Somewhat difficult Not difficult at all Somewhat difficult      4 HSV I Uses valtrex when needed and that will usually stop outbreak.    Outpatient Encounter Medications as of 10/13/2019  Medication Sig   clonazePAM (KLONOPIN) 0.5 MG tablet Take 1 tablet (0.5 mg total) by mouth 2 (two) times daily as needed for anxiety.   escitalopram (LEXAPRO) 20 MG tablet Take 1 tablet (20 mg total) by mouth daily.   lisinopril-hydrochlorothiazide (ZESTORETIC) 20-12.5 MG tablet Take 1 tablet by mouth daily.    sulfamethoxazole-trimethoprim (BACTRIM DS) 800-160 MG tablet Take 1 tablet by mouth 2 (two) times daily.   valACYclovir (VALTREX) 500 MG tablet Take twice daily for 3 to 5 days as needed   Past Surgical History:  Procedure Laterality Date   GYNECOLOGIC CRYOSURGERY  1992   LAPAROSCOPIC TUBAL LIGATION Bilateral 03/30/2012   Procedure: LAPAROSCOPIC TUBAL LIGATION;  Surgeon: TAnastasio Auerbach MD;  [facility];  Service: Gynecology;  Laterality: Bilateral;   mole removed     face - left cheek   WISDOM TOOTH EXTRACTION      Family History  Problem Relation Age of Onset   Hypertension Mother    Hypertension Sister    Diabetes Maternal Grandmother    Hypertension Maternal Grandfather    Heart disease Maternal Grandfather     New complaints: None today  Social history: Lives with her fiance  Controlled substance contract: 03/23/19    Review of Systems  Constitutional: Negative for diaphoresis.  Eyes: Negative for pain.  Respiratory: Negative for shortness of breath.   Cardiovascular: Negative for chest pain, palpitations and leg swelling.  Gastrointestinal: Negative for abdominal pain.  Endocrine: Negative for polydipsia.  Skin: Negative for rash.  Neurological: Negative for dizziness, weakness and headaches.  Hematological: Does not bruise/bleed easily.  All  other systems reviewed and are negative.      Objective:   Physical Exam Vitals and nursing note reviewed.  Constitutional:      General: She is not in acute distress.    Appearance: Normal appearance. She is well-developed.  HENT:     Head: Normocephalic.     Nose: Nose normal.  Eyes:     Pupils: Pupils are equal, round, and reactive to light.  Neck:     Vascular: No carotid bruit or JVD.  Cardiovascular:     Rate and Rhythm: Normal rate and regular rhythm.     Heart sounds: Normal heart sounds.  Pulmonary:     Effort: Pulmonary effort is normal. No respiratory distress.     Breath sounds:  Normal breath sounds. No wheezing or rales.  Chest:     Chest wall: No tenderness.  Abdominal:     General: Bowel sounds are normal. There is no distension or abdominal bruit.     Palpations: Abdomen is soft. There is no hepatomegaly, splenomegaly, mass or pulsatile mass.     Tenderness: There is no abdominal tenderness.  Musculoskeletal:        General: Normal range of motion.     Cervical back: Normal range of motion and neck supple.  Lymphadenopathy:     Cervical: No cervical adenopathy.  Skin:    General: Skin is warm and dry.  Neurological:     Mental Status: She is alert and oriented to person, place, and time.     Deep Tendon Reflexes: Reflexes are normal and symmetric.  Psychiatric:        Behavior: Behavior normal.        Thought Content: Thought content normal.        Judgment: Judgment normal.    BP 131/88    Pulse 62    Temp 97.8 F (36.6 C) (Temporal)    Resp 20    Ht _0  (1.549 m)    Wt 136 lb (61.7 kg)    SpO2 97%    BMI 25.70 kg/m         Assessment & Plan:  Leslie Ritter comes in today with chief complaint of Medical Management of Chronic Issues   Diagnosis and orders addressed:  1. Essential hypertension Low sodium diet - lisinopril-hydrochlorothiazide (ZESTORETIC) 20-12.5 MG tablet; Take 1 tablet by mouth daily.  Dispense: 90 tablet; Refill: 1 - CMP14+EGFR - CBC with Differential/Platelet - Lipid panel  2. Recurrent major depressive disorder, in full remission [facility]) Stress management  3. GAD (generalized anxiety disorder) Stress management - escitalopram (LEXAPRO) 20 MG tablet; Take 1 tablet (20 mg total) by mouth daily.  Dispense: 90 tablet; Refill: 1 - clonazePAM (KLONOPIN) 0.5 MG tablet; Take 1 tablet (0.5 mg total) by mouth 2 (two) times daily as needed for anxiety.  Dispense: 60 tablet; Refill: 5  4. HSV infection - valACYclovir (VALTREX) 500 MG tablet; Take twice daily for 3 to 5 days as needed  Dispense: 30 tablet; Refill:  12   Labs pending Health Maintenance reviewed Diet and exercise encouraged  Follow up plan: 6 months   Leslie Hassell Done, FNP

## 2019-10-13 NOTE — Patient Instructions (Signed)
Stress, Adult Stress is a normal reaction to life events. Stress is what you feel when life demands more than you are used to, or more than you think you can handle. Some stress can be useful, such as studying for a test or meeting a deadline at work. Stress that occurs too often or for too long can cause problems. It can affect your emotional health and interfere with relationships and normal daily activities. Too much stress can weaken your body's defense system (immune system) and increase your risk for physical illness. If you already have a medical problem, stress can make it worse. What are the causes? All sorts of life events can cause stress. An event that causes stress for one person may not be stressful for another person. Major life events, whether positive or negative, commonly cause stress. Examples include:  Losing a job or starting a new job.  Losing a loved one.  Moving to a new town or home.  Getting married or divorced.  Having a baby.  Getting injured or sick. Less obvious life events can also cause stress, especially if they occur day after day or in combination with each other. Examples include:  Working long hours.  Driving in traffic.  Caring for children.  Being in debt.  Being in a difficult relationship. What are the signs or symptoms? Stress can cause emotional symptoms, including:  Anxiety. This is feeling worried, afraid, on edge, overwhelmed, or out of control.  Anger, including irritation or impatience.  Depression. This is feeling sad, down, helpless, or guilty.  Trouble focusing, remembering, or making decisions. Stress can cause physical symptoms, including:  Aches and pains. These may affect your head, neck, back, stomach, or other areas of your body.  Tight muscles or a clenched jaw.  Low energy.  Trouble sleeping. Stress can cause unhealthy behaviors, including:  Eating to feel better (overeating) or skipping meals.  Working too  much or putting off tasks.  Smoking, drinking alcohol, or using drugs to feel better. How is this diagnosed? Stress is diagnosed through an assessment by your health care provider. He or she may diagnose this condition based on:  Your symptoms and any stressful life events.  Your medical history.  Tests to rule out other causes of your symptoms. Depending on your condition, your health care provider may refer you to a specialist for further evaluation. How is this treated?  Stress management techniques are the recommended treatment for stress. Medicine is not typically recommended for the treatment of stress. Techniques to reduce your reaction to stressful life events include:  Stress identification. Monitor yourself for symptoms of stress and identify what causes stress for you. These skills may help you to avoid or prepare for stressful events.  Time management. Set your priorities, keep a calendar of events, and learn to say no. Taking these actions can help you avoid making too many commitments. Techniques for coping with stress include:  Rethinking the problem. Try to think realistically about stressful events rather than ignoring them or overreacting. Try to find the positives in a stressful situation rather than focusing on the negatives.  Exercise. Physical exercise can release both physical and emotional tension. The key is to find a form of exercise that you enjoy and do it regularly.  Relaxation techniques. These relax the body and mind. The key is to find one or more that you enjoy and use the techniques regularly. Examples include: ? Meditation, deep breathing, or progressive relaxation techniques. ? Yoga or   tai chi. ? Biofeedback, mindfulness techniques, or journaling. ? Listening to music, being out in nature, or participating in other hobbies.  Practicing a healthy lifestyle. Eat a balanced diet, drink plenty of water, limit or avoid caffeine, and get plenty of  sleep.  Having a strong support network. Spend time with family, friends, or other people you enjoy being around. Express your feelings and talk things over with someone you trust. Counseling or talk therapy with a mental health professional may be helpful if you are having trouble managing stress on your own. Follow these instructions at home: Lifestyle   Avoid drugs.  Do not use any products that contain nicotine or tobacco, such as cigarettes, e-cigarettes, and chewing tobacco. If you need help quitting, ask your health care provider.  Limit alcohol intake to no more than 1 drink a day for nonpregnant women and 2 drinks a day for men. One drink equals 12 oz of beer, 5 oz of wine, or 1 oz of hard liquor  Do not use alcohol or drugs to relax.  Eat a balanced diet that includes fresh fruits and vegetables, whole grains, lean meats, fish, eggs, and beans, and low-fat dairy. Avoid processed foods and foods high in added fat, sugar, and salt.  Exercise at least 30 minutes on 5 or more days each week.  Get 7-8 hours of sleep each night. General instructions   Practice stress management techniques as discussed with your health care provider.  Drink enough fluid to keep your urine clear or pale yellow.  Take over-the-counter and prescription medicines only as told by your health care provider.  Keep all follow-up visits as told by your health care provider. This is important. Contact a health care provider if:  Your symptoms get worse.  You have new symptoms.  You feel overwhelmed by your problems and can no longer manage them on your own. Get help right away if:  You have thoughts of hurting yourself or others. If you ever feel like you may hurt yourself or others, or have thoughts about taking your own life, get help right away. You can go to your nearest emergency department or call:  Your local emergency services (911 in the [place]  A suicide crisis helpline, such as the  [place] at (316) 250-6172. This is open 24 hours a day. Summary  Stress is a normal reaction to life events. It can cause problems if it happens too often or for too long.  Practicing stress management techniques is the best way to treat stress.  Counseling or talk therapy with a mental health professional may be helpful if you are having trouble managing stress on your own. This information is not intended to replace advice given to you by your health care provider. Make sure you discuss any questions you have with your health care provider. Document Revised: 08/06/2018 Document Reviewed: 02/27/2016 Elsevier Patient Education  [place].

## 2019-10-14 LAB — CBC WITH DIFFERENTIAL/PLATELET
Basophils Absolute: 0 10*3/uL (ref 0.0–0.2)
Basos: 1 %
EOS (ABSOLUTE): 0.1 10*3/uL (ref 0.0–0.4)
Eos: 1 %
Hematocrit: 39.6 % (ref 34.0–46.6)
Hemoglobin: 13.4 g/dL (ref 11.1–15.9)
Immature Grans (Abs): 0 10*3/uL (ref 0.0–0.1)
Immature Granulocytes: 0 %
Lymphocytes Absolute: 2.2 10*3/uL (ref 0.7–3.1)
Lymphs: 27 %
MCH: 31.8 pg (ref 26.6–33.0)
MCHC: 33.8 g/dL (ref 31.5–35.7)
MCV: 94 fL (ref 79–97)
Monocytes Absolute: 0.7 10*3/uL (ref 0.1–0.9)
Monocytes: 9 %
Neutrophils Absolute: 5.1 10*3/uL (ref 1.4–7.0)
Neutrophils: 62 %
Platelets: 313 10*3/uL (ref 150–450)
RBC: 4.22 x10E6/uL (ref 3.77–5.28)
RDW: 11.9 % (ref 11.7–15.4)
WBC: 8.2 10*3/uL (ref 3.4–10.8)

## 2019-10-14 LAB — LIPID PANEL
Chol/HDL Ratio: 1.7 ratio (ref 0.0–4.4)
Cholesterol, Total: 170 mg/dL (ref 100–199)
HDL: 100 mg/dL (ref >39)
LDL Chol Calc (NIH): 59 mg/dL (ref 0–99)
Triglycerides: 52 mg/dL (ref 0–149)
VLDL Cholesterol Cal: 11 mg/dL (ref 5–40)

## 2019-10-14 LAB — CMP14+EGFR
ALT: 13 IU/L (ref 0–32)
AST: 11 IU/L (ref 0–40)
Albumin/Globulin Ratio: 2.2 (ref 1.2–2.2)
Albumin: 4.7 g/dL (ref 3.8–4.8)
Alkaline Phosphatase: 49 IU/L (ref 44–121)
BUN/Creatinine Ratio: 32 — ABNORMAL HIGH (ref 9–23)
BUN: 21 mg/dL (ref 6–24)
Bilirubin Total: 0.6 mg/dL (ref 0.0–1.2)
CO2: 26 mmol/L (ref 20–29)
Calcium: 9.4 mg/dL (ref 8.7–10.2)
Chloride: 101 mmol/L (ref 96–106)
Creatinine, Ser: 0.65 mg/dL (ref 0.57–1.00)
GFR calc Af Amer: 121 mL/min/{1.73_m2} (ref >59)
GFR calc non Af Amer: 105 mL/min/{1.73_m2} (ref >59)
Globulin, Total: 2.1 g/dL (ref 1.5–4.5)
Glucose: 93 mg/dL (ref 65–99)
Potassium: 3.9 mmol/L (ref 3.5–5.2)
Sodium: 139 mmol/L (ref 134–144)
Total Protein: 6.8 g/dL (ref 6.0–8.5)

## 2020-03-07 ENCOUNTER — Other Ambulatory Visit: Payer: Self-pay | Admitting: Nurse Practitioner

## 2020-03-07 DIAGNOSIS — Z1231 Encounter for screening mammogram for malignant neoplasm of breast: Secondary | ICD-10-CM

## 2020-04-11 ENCOUNTER — Other Ambulatory Visit: Payer: Self-pay

## 2020-04-11 ENCOUNTER — Encounter: Payer: Self-pay | Admitting: Nurse Practitioner

## 2020-04-11 ENCOUNTER — Ambulatory Visit: Payer: 59 | Admitting: Nurse Practitioner

## 2020-04-11 VITALS — BP 112/77 | HR 76 | Temp 98.1°F | Resp 20 | Ht 61.0 in | Wt 141.0 lb

## 2020-04-11 DIAGNOSIS — Z6826 Body mass index (BMI) 26.0-26.9, adult: Secondary | ICD-10-CM

## 2020-04-11 DIAGNOSIS — F411 Generalized anxiety disorder: Secondary | ICD-10-CM

## 2020-04-11 DIAGNOSIS — F3342 Major depressive disorder, recurrent, in full remission: Secondary | ICD-10-CM | POA: Diagnosis not present

## 2020-04-11 DIAGNOSIS — I10 Essential (primary) hypertension: Secondary | ICD-10-CM | POA: Diagnosis not present

## 2020-04-11 DIAGNOSIS — Z1211 Encounter for screening for malignant neoplasm of colon: Secondary | ICD-10-CM

## 2020-04-11 DIAGNOSIS — B009 Herpesviral infection, unspecified: Secondary | ICD-10-CM

## 2020-04-11 DIAGNOSIS — Z1212 Encounter for screening for malignant neoplasm of rectum: Secondary | ICD-10-CM

## 2020-04-11 MED ORDER — CLONAZEPAM 0.5 MG PO TABS: 0.5000 mg | ORAL_TABLET | Freq: Two times a day (BID) | ORAL | 5 refills | Status: DC | PRN

## 2020-04-11 MED ORDER — LISINOPRIL-HYDROCHLOROTHIAZIDE 20-12.5 MG PO TABS: 1.0000 | ORAL_TABLET | Freq: Every day | ORAL | 1 refills | Status: DC

## 2020-04-11 MED ORDER — VALACYCLOVIR HCL 500 MG PO TABS: ORAL_TABLET | ORAL | 12 refills | Status: DC

## 2020-04-11 MED ORDER — ESCITALOPRAM OXALATE 20 MG PO TABS: 20.0000 mg | ORAL_TABLET | Freq: Every day | ORAL | 1 refills | Status: DC

## 2020-04-11 NOTE — Progress Notes (Signed)
Subjective:    Patient ID: Leslie Ritter, female    DOB: Feb 12, 1970, 50 y.o.   MRN: [id number]   Chief Complaint: medical management of chronic issues     HPI:  1. Primary hypertension No c/o chest pain, sob or headache. Does not check blood pressure at home. BP Readings from Last 3 Encounters:  10/13/19 131/88  04/13/19 110/78  03/21/19 118/81     2. Recurrent major depressive disorder, in full remission ([place]) Is currently on lexapro 43m daily and is doing well. [facility] 04/11/2020 10/13/2019 03/21/2019  Decreased Interest 0 0 0  Down, Depressed, Hopeless 0 0 0  PHQ - 2 Score 0 0 0  Altered sleeping 0 1 -  Tired, decreased energy 1 1 -  Change in appetite 0 0 -  Feeling bad or failure about yourself  0 0 -  Trouble concentrating 0 0 -  Moving slowly or fidgety/restless 0 0 -  Suicidal thoughts 0 0 -  PHQ-9 Score 1 2 -  Difficult doing work/chores Not difficult at all Not difficult at all -     3. GAD (generalized anxiety disorder) Takes klonopin BID. Gets very anxious when she does not take GAD 7 : Generalized Anxiety Score 04/11/2020 10/13/2019 03/21/2019 11/30/2017  Nervous, Anxious, on Edge _0 Control/stop worrying _1 Worry too much - different things 1 1 0 1  Trouble relaxing 0 1 0 1  Restless 1 1 0 1  Easily annoyed or irritable 1 0 0 1  Afraid - awful might happen 0 0 0 0  Total GAD 7 Score _2 Anxiety Difficulty Not difficult at all Somewhat difficult Somewhat difficult Not difficult at all     4. BMI 26.0-26.9,adult Wt Readings from Last 3 Encounters:  04/11/20 141 lb (64 kg)  10/13/19 136 lb (61.7 kg)  04/13/19 134 lb (60.8 kg)   BMI Readings from Last 3 Encounters:  04/11/20 26.64 kg/m  10/13/19 25.70 kg/m  04/13/19 25.32 kg/m    5.HSV1- Takes valtrex as needed   Outpatient Encounter Medications as of 04/11/2020  Medication Sig  . clonazePAM (KLONOPIN) 0.5 MG tablet Take 1 tablet (0.5 mg total) by  mouth 2 (two) times daily as needed for anxiety.  .Marland Kitchenescitalopram (LEXAPRO) 20 MG tablet Take 1 tablet (20 mg total) by mouth daily.  .Marland Kitchenlisinopril-hydrochlorothiazide (ZESTORETIC) 20-12.5 MG tablet Take 1 tablet by mouth daily.  . valACYclovir (VALTREX) 500 MG tablet Take twice daily for 3 to 5 days as needed   No facility-administered encounter medications on file as of 04/11/2020.    Past Surgical History:  Procedure Laterality Date  . GYNECOLOGIC CRYOSURGERY  1992  . LAPAROSCOPIC TUBAL LIGATION Bilateral 03/30/2012   Procedure: LAPAROSCOPIC TUBAL LIGATION;  Surgeon: TAnastasio Auerbach MD;  [facility];  Service: Gynecology;  Laterality: Bilateral;  . mole removed     face - left cheek  . WISDOM TOOTH EXTRACTION      Family History  Problem Relation Age of Onset  . Hypertension Mother   . Hypertension Sister   . Diabetes Maternal Grandmother   . Hypertension Maternal Grandfather   . Heart disease Maternal Grandfather     New complaints: None today  Social history: Lives with her fiance  Controlled substance contract: 04/11/20    Review of Systems  Constitutional: Negative for diaphoresis.  Eyes: Negative for pain.  Respiratory: Negative for shortness of breath.  Cardiovascular: Negative for chest pain, palpitations and leg swelling.  Gastrointestinal: Negative for abdominal pain.  Endocrine: Negative for polydipsia.  Skin: Negative for rash.  Neurological: Negative for dizziness, weakness and headaches.  Hematological: Does not bruise/bleed easily.  All other systems reviewed and are negative.      Objective:   Physical Exam Vitals and nursing note reviewed.  Constitutional:      General: She is not in acute distress.    Appearance: Normal appearance. She is well-developed.  HENT:     Head: Normocephalic.     Nose: Nose normal.  Eyes:     Pupils: Pupils are equal, round, and reactive to light.  Neck:     Vascular: No carotid bruit or JVD.   Cardiovascular:     Rate and Rhythm: Normal rate and regular rhythm.     Heart sounds: Normal heart sounds.  Pulmonary:     Effort: Pulmonary effort is normal. No respiratory distress.     Breath sounds: Normal breath sounds. No wheezing or rales.  Chest:     Chest wall: No tenderness.  Abdominal:     General: Bowel sounds are normal. There is no distension or abdominal bruit.     Palpations: Abdomen is soft. There is no hepatomegaly, splenomegaly, mass or pulsatile mass.     Tenderness: There is no abdominal tenderness.  Musculoskeletal:        General: Normal range of motion.     Cervical back: Normal range of motion and neck supple.  Lymphadenopathy:     Cervical: No cervical adenopathy.  Skin:    General: Skin is warm and dry.  Neurological:     Mental Status: She is alert and oriented to person, place, and time.     Deep Tendon Reflexes: Reflexes are normal and symmetric.  Psychiatric:        Behavior: Behavior normal.        Thought Content: Thought content normal.        Judgment: Judgment normal.    BP 112/77   Pulse 76   Temp 98.1 F (36.7 C) (Temporal)   Resp 20   Ht _0  (1.549 m)   Wt 141 lb (64 kg)   SpO2 98%   BMI 26.64 kg/m        Assessment & Plan:  Trish Mage Jafri comes in today with chief complaint of Medical Management of Chronic Issues   Diagnosis and orders addressed:  1. Primary hypertension Low sodium diet - lisinopril-hydrochlorothiazide (ZESTORETIC) 20-12.5 MG tablet; Take 1 tablet by mouth daily.  Dispense: 90 tablet; Refill: 1 - CBC with Differential/Platelet - CMP14+EGFR - Lipid panel  2. Recurrent major depressive disorder, in full remission [facility]) Stress management  3. GAD (generalized anxiety disorder) - escitalopram (LEXAPRO) 20 MG tablet; Take 1 tablet (20 mg total) by mouth daily.  Dispense: 90 tablet; Refill: 1 - clonazePAM (KLONOPIN) 0.5 MG tablet; Take 1 tablet (0.5 mg total) by mouth 2 (two) times daily as needed  for anxiety.  Dispense: 60 tablet; Refill: 5  4. BMI 26.0-26.9,adult Discussed diet and exercise for person with BMI >25 Will recheck weight in 3-6 months  5. HSV infection - valACYclovir (VALTREX) 500 MG tablet; Take twice daily for 3 to 5 days as needed  Dispense: 30 tablet; Refill: 12   Labs pending Health Maintenance reviewed Diet and exercise encouraged  Follow up plan: 6 months   Mary-Margaret Hassell Done, FNP

## 2020-04-12 LAB — CMP14+EGFR
ALT: 11 IU/L (ref 0–32)
AST: 10 IU/L (ref 0–40)
Albumin/Globulin Ratio: 1.9 (ref 1.2–2.2)
Albumin: 4.7 g/dL (ref 3.8–4.8)
Alkaline Phosphatase: 47 IU/L (ref 44–121)
BUN/Creatinine Ratio: 22 (ref 9–23)
BUN: 16 mg/dL (ref 6–24)
Bilirubin Total: 0.4 mg/dL (ref 0.0–1.2)
CO2: 25 mmol/L (ref 20–29)
Calcium: 9.5 mg/dL (ref 8.7–10.2)
Chloride: 96 mmol/L (ref 96–106)
Creatinine, Ser: 0.72 mg/dL (ref 0.57–1.00)
Globulin, Total: 2.5 g/dL (ref 1.5–4.5)
Glucose: 108 mg/dL — ABNORMAL HIGH (ref 65–99)
Potassium: 4 mmol/L (ref 3.5–5.2)
Sodium: 138 mmol/L (ref 134–144)
Total Protein: 7.2 g/dL (ref 6.0–8.5)
eGFR: 102 mL/min/{1.73_m2} (ref >59)

## 2020-04-12 LAB — CBC WITH DIFFERENTIAL/PLATELET
Basophils Absolute: 0 10*3/uL (ref 0.0–0.2)
Basos: 0 %
EOS (ABSOLUTE): 0.1 10*3/uL (ref 0.0–0.4)
Eos: 1 %
Hematocrit: 38.8 % (ref 34.0–46.6)
Hemoglobin: 13.4 g/dL (ref 11.1–15.9)
Immature Grans (Abs): 0 10*3/uL (ref 0.0–0.1)
Immature Granulocytes: 0 %
Lymphocytes Absolute: 2.3 10*3/uL (ref 0.7–3.1)
Lymphs: 27 %
MCH: 32.4 pg (ref 26.6–33.0)
MCHC: 34.5 g/dL (ref 31.5–35.7)
MCV: 94 fL (ref 79–97)
Monocytes Absolute: 0.7 10*3/uL (ref 0.1–0.9)
Monocytes: 8 %
Neutrophils Absolute: 5.5 10*3/uL (ref 1.4–7.0)
Neutrophils: 64 %
Platelets: 303 10*3/uL (ref 150–450)
RBC: 4.13 x10E6/uL (ref 3.77–5.28)
RDW: 12.1 % (ref 11.7–15.4)
WBC: 8.7 10*3/uL (ref 3.4–10.8)

## 2020-04-12 LAB — LIPID PANEL
Chol/HDL Ratio: 1.8 ratio (ref 0.0–4.4)
Cholesterol, Total: 175 mg/dL (ref 100–199)
HDL: 95 mg/dL (ref >39)
LDL Chol Calc (NIH): 61 mg/dL (ref 0–99)
Triglycerides: 113 mg/dL (ref 0–149)
VLDL Cholesterol Cal: 19 mg/dL (ref 5–40)

## 2020-04-18 ENCOUNTER — Encounter: Payer: Self-pay | Admitting: Nurse Practitioner

## 2020-04-19 ENCOUNTER — Other Ambulatory Visit: Payer: Self-pay

## 2020-04-19 ENCOUNTER — Encounter: Payer: Self-pay | Admitting: Nurse Practitioner

## 2020-04-19 ENCOUNTER — Ambulatory Visit: Payer: 59 | Admitting: Nurse Practitioner

## 2020-04-19 ENCOUNTER — Other Ambulatory Visit (HOSPITAL_COMMUNITY)
Admission: RE | Admit: 2020-04-19 | Discharge: 2020-04-19 | Disposition: A | Discharge status: Home / Self Care | Payer: 59 | Source: Ambulatory Visit | Attending: Nurse Practitioner | Admitting: Nurse Practitioner

## 2020-04-19 VITALS — BP 120/76 | Ht 61.0 in | Wt 143.0 lb

## 2020-04-19 DIAGNOSIS — N951 Menopausal and female climacteric states: Secondary | ICD-10-CM | POA: Diagnosis not present

## 2020-04-19 DIAGNOSIS — Z01419 Encounter for gynecological examination (general) (routine) without abnormal findings: Secondary | ICD-10-CM

## 2020-04-19 DIAGNOSIS — F411 Generalized anxiety disorder: Secondary | ICD-10-CM | POA: Diagnosis not present

## 2020-04-19 MED ORDER — ESCITALOPRAM OXALATE 10 MG PO TABS: 10.0000 mg | ORAL_TABLET | Freq: Every day | ORAL | 1 refills | Status: DC

## 2020-04-19 NOTE — Patient Instructions (Signed)
Health Maintenance, Female Adopting a healthy lifestyle and getting preventive care are important in promoting health and wellness. Ask your health care provider about:  The right schedule for you to have regular tests and exams.  Things you can do on your own to prevent diseases and keep yourself healthy. What should I know about diet, weight, and exercise? Eat a healthy diet  Eat a diet that includes plenty of vegetables, fruits, low-fat dairy products, and lean protein.  Do not eat a lot of foods that are high in solid fats, added sugars, or sodium.   Maintain a healthy weight Body mass index (BMI) is used to identify weight problems. It estimates body fat based on height and weight. Your health care provider can help determine your BMI and help you achieve or maintain a healthy weight. Get regular exercise Get regular exercise. This is one of the most important things you can do for your health. Most adults should:  Exercise for at least 150 minutes each week. The exercise should increase your heart rate and make you sweat (moderate-intensity exercise).  Do strengthening exercises at least twice a week. This is in addition to the moderate-intensity exercise.  Spend less time sitting. Even light physical activity can be beneficial. Watch cholesterol and blood lipids Have your blood tested for lipids and cholesterol at 51 years of age, then have this test every 5 years. Have your cholesterol levels checked more often if:  Your lipid or cholesterol levels are high.  You are older than 51 years of age.  You are at high risk for heart disease. What should I know about cancer screening? Depending on your health history and family history, you may need to have cancer screening at various ages. This may include screening for:  Breast cancer.  Cervical cancer.  Colorectal cancer.  Skin cancer.  Lung cancer. What should I know about heart disease, diabetes, and high blood  pressure? Blood pressure and heart disease  High blood pressure causes heart disease and increases the risk of stroke. This is more likely to develop in people who have high blood pressure readings, are of African descent, or are overweight.  Have your blood pressure checked: ? Every 3-5 years if you are 18-39 years of age. ? Every year if you are 40 years old or older. Diabetes Have regular diabetes screenings. This checks your fasting blood sugar level. Have the screening done:  Once every three years after age 40 if you are at a normal weight and have a low risk for diabetes.  More often and at a younger age if you are overweight or have a high risk for diabetes. What should I know about preventing infection? Hepatitis B If you have a higher risk for hepatitis B, you should be screened for this virus. Talk with your health care provider to find out if you are at risk for hepatitis B infection. Hepatitis C Testing is recommended for:  Everyone born from 1945 through 1965.  Anyone with known risk factors for hepatitis C. Sexually transmitted infections (STIs)  Get screened for STIs, including gonorrhea and chlamydia, if: ? You are sexually active and are younger than 51 years of age. ? You are older than 51 years of age and your health care provider tells you that you are at risk for this type of infection. ? Your sexual activity has changed since you were last screened, and you are at increased risk for chlamydia or gonorrhea. Ask your health care provider   if you are at risk.  Ask your health care provider about whether you are at high risk for HIV. Your health care provider may recommend a prescription medicine to help prevent HIV infection. If you choose to take medicine to prevent HIV, you should first get tested for HIV. You should then be tested every 3 months for as long as you are taking the medicine. Pregnancy  If you are about to stop having your period (premenopausal) and  you may become pregnant, seek counseling before you get pregnant.  Take 400 to 800 micrograms (mcg) of folic acid every day if you become pregnant.  Ask for birth control (contraception) if you want to prevent pregnancy. Osteoporosis and menopause Osteoporosis is a disease in which the bones lose minerals and strength with aging. This can result in bone fractures. If you are 65 years old or older, or if you are at risk for osteoporosis and fractures, ask your health care provider if you should:  Be screened for bone loss.  Take a calcium or vitamin D supplement to lower your risk of fractures.  Be given hormone replacement therapy (HRT) to treat symptoms of menopause. Follow these instructions at home: Lifestyle  Do not use any products that contain nicotine or tobacco, such as cigarettes, e-cigarettes, and chewing tobacco. If you need help quitting, ask your health care provider.  Do not use street drugs.  Do not share needles.  Ask your health care provider for help if you need support or information about quitting drugs. Alcohol use  Do not drink alcohol if: ? Your health care provider tells you not to drink. ? You are pregnant, may be pregnant, or are planning to become pregnant.  If you drink alcohol: ? Limit how much you use to 0-1 drink a day. ? Limit intake if you are breastfeeding.  Be aware of how much alcohol is in your drink. In the [place] one drink equals one 12 oz bottle of beer (355 mL), one 5 oz glass of wine (148 mL), or one 1 oz glass of hard liquor (44 mL). General instructions  Schedule regular health, dental, and eye exams.  Stay current with your vaccines.  Tell your health care provider if: ? You often feel depressed. ? You have ever been abused or do not feel safe at home. Summary  Adopting a healthy lifestyle and getting preventive care are important in promoting health and wellness.  Follow your health care provider's instructions about healthy  diet, exercising, and getting tested or screened for diseases.  Follow your health care provider's instructions on monitoring your cholesterol and blood pressure. This information is not intended to replace advice given to you by your health care provider. Make sure you discuss any questions you have with your health care provider. Document Revised: 12/30/2017 Document Reviewed: 12/30/2017 Elsevier Patient Education  2021 [employer]  

## 2020-04-19 NOTE — Progress Notes (Signed)
   Leslie Ritter 08-17-1970 [id number]   History:  50 y.o. G1P1 presents for annual exam. Has started having irregular cycles ranging from 1-3 months. She has noticed worsening anxiety and feels much more emotional. She takes Lexapro 20 mg daily for GAD. She is also having occasional hot flashes. BTL. 1992 cryo, 2009 LGSIL, subsequent paps normal. Normal mammogram history. HTN and anxiety managed by PCP.   Gynecologic History Patient's last menstrual period was 04/05/2020. Period Duration (Days): 6 Period Pattern: (!) Irregular Menstrual Flow: Light Dysmenorrhea: None Contraception/Family planning: tubal ligation  Health Maintenance Last Pap: 04/13/2019. Results were: normal Last mammogram: 07/08/2019. Results were: normal Last colonoscopy: Never Last Dexa: N/A  Past medical history, past surgical history, family history and social history were all reviewed and documented in the EPIC chart. Married. 1 yo son. 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   ROS:  A ROS was performed and pertinent positives and negatives are included.  Exam:  Vitals:   04/19/20 1554  BP: 120/76  Weight: 143 lb (64.9 kg)  Height: 5\' 1"  (1.549 m)   Body mass index is 27.02 kg/m.  General appearance:  Normal Thyroid:  Symmetrical, normal in size, without palpable masses or nodularity. Respiratory  Auscultation:  Clear without wheezing or rhonchi Cardiovascular  Auscultation:  Regular rate, without rubs, murmurs or gallops  Edema/varicosities:  Not grossly evident Abdominal  Soft,nontender, without masses, guarding or rebound.  Liver/spleen:  No organomegaly noted  Hernia:  None appreciated  Skin  Inspection:  Grossly normal   Breasts: Examined lying and sitting.   Right: Without masses, retractions, discharge or axillary adenopathy.   Left: Without masses, retractions, discharge or axillary adenopathy. Gentitourinary   Inguinal/mons:  Normal without inguinal adenopathy  External genitalia:   Normal  BUS/Urethra/Skene's glands:  Normal  Vagina:  Normal  Cervix:  Normal  Uterus:  Normal in size, shape and contour.  Midline and mobile  Adnexa/parametria:     Rt: Without masses or tenderness.   Lt: Without masses or tenderness.  Anus and perineum: Normal  Digital rectal exam: Normal sphincter tone without palpated masses or tenderness  Assessment/Plan:  50 y.o. G1P1 for annual exam.   Well female exam with routine gynecological exam - Education provided on SBEs, importance of preventative screenings, current guidelines, high calcium diet, regular exercise, and multivitamin daily. Labs with PCP.   Generalized anxiety disorder - Increase Lexapro from 20 mg to 30 mg daily. She is aware it will take 6-8 weeks for full effectiveness.   Perimenopause - Has started having irregular cycles ranging from 1-3 months. She has noticed worsening anxiety and feels much more emotional. She takes Lexapro 20 mg daily for GAD. She has occasional hot flashes. We discussed management of menopausal symptoms to include OTC supplements, hormone replacement therapy, or increasing Lexapro. Her main concern is her mood changes so she would like to increase Lexapro first. We will increase from 20 mg to 30 mg If ineffective she will return to discuss HRT in more detail.   Screening for cervical cancer - 1992 cryo, 2009 LGSIL, subsequent paps normal. We discussed current guidelines. She requests annual paps.   Screening for breast cancer - Normal mammogram history.  Continue annual screenings.  Normal breast exam today.  Screening for colon cancer - Has not had screening colonoscopy. Discussed current guidelines and importance of preventative screenings. She plans to schedule this soon.   Return in 1 year for annual.   Tamela Gammon DNP, 4:03 PM 04/19/2020

## 2020-04-23 LAB — CYTOLOGY - PAP: Diagnosis: NEGATIVE

## 2020-05-16 ENCOUNTER — Other Ambulatory Visit: Payer: Self-pay

## 2020-06-13 ENCOUNTER — Encounter: Payer: Self-pay | Admitting: Internal Medicine

## 2020-07-12 ENCOUNTER — Other Ambulatory Visit: Payer: Self-pay

## 2020-07-12 ENCOUNTER — Ambulatory Visit
Admission: RE | Admit: 2020-07-12 | Discharge: 2020-07-12 | Disposition: A | Discharge status: Home / Self Care | Payer: 59 | Source: Ambulatory Visit | Attending: Nurse Practitioner | Admitting: Nurse Practitioner

## 2020-07-12 DIAGNOSIS — Z1231 Encounter for screening mammogram for malignant neoplasm of breast: Secondary | ICD-10-CM

## 2020-07-24 ENCOUNTER — Ambulatory Visit: Payer: 59 | Admitting: Gastroenterology

## 2020-08-02 ENCOUNTER — Encounter: Payer: Self-pay | Admitting: Internal Medicine

## 2020-08-09 ENCOUNTER — Ambulatory Visit: Payer: 59 | Admitting: Internal Medicine

## 2020-08-16 ENCOUNTER — Other Ambulatory Visit: Payer: Self-pay

## 2020-08-16 ENCOUNTER — Encounter: Payer: Self-pay | Admitting: Nurse Practitioner

## 2020-08-16 ENCOUNTER — Ambulatory Visit: Payer: 59 | Admitting: Nurse Practitioner

## 2020-08-16 VITALS — BP 118/78

## 2020-08-16 DIAGNOSIS — N951 Menopausal and female climacteric states: Secondary | ICD-10-CM

## 2020-08-16 DIAGNOSIS — Z79890 Hormone replacement therapy (postmenopausal): Secondary | ICD-10-CM

## 2020-08-16 MED ORDER — ESTRADIOL 0.5 MG PO TABS: 0.5000 mg | ORAL_TABLET | Freq: Every day | ORAL | 2 refills | Status: DC

## 2020-08-16 MED ORDER — PROGESTERONE MICRONIZED 100 MG PO CAPS: 100.0000 mg | ORAL_CAPSULE | Freq: Every day | ORAL | 2 refills | Status: DC

## 2020-08-16 NOTE — Progress Notes (Signed)
   Acute Office Visit  Subjective:    Patient ID: Leslie Ritter, female    DOB: [birth year], 50 y.o.   MRN: [id number]   HPI 50 y.o. presents today for mood swings, worsening anxiety, and vasomotor symptoms. She has had anxiety for many years that has been managed with Lexapro. When seen March 2022 for an annual visit she was struggling with worsening anxiety that appeared to be related to perimenopause. At that time we discussed option to increase Lexapro versus HRT. She decided to increase Lexapro but she has not noticed any improvement. It is affecting her daily life. She is also experiencing hot flashes and night sweats. Cycles are becoming less frequent and ranging from 3-4 months.    Review of Systems  Constitutional: Negative.   Endocrine: Positive for heat intolerance.  Genitourinary:  Positive for menstrual problem.  Psychiatric/Behavioral:  Positive for sleep disturbance. The patient is nervous/anxious.       Objective:    Physical Exam Constitutional:      Appearance: Normal appearance.  Psychiatric:        Attention and Perception: Attention normal.        Mood and Affect: Affect is tearful.    BP 118/78   LMP 05/16/2020  Wt Readings from Last 3 Encounters:  04/19/20 143 lb (64.9 kg)  04/11/20 141 lb (64 kg)  10/13/19 136 lb (61.7 kg)        Assessment & Plan:   Problem List Items Addressed This Visit   None Visit Diagnoses     Menopausal symptoms    -  Primary   Relevant Medications   estradiol (ESTRACE) 0.5 MG tablet   progesterone (PROMETRIUM) 100 MG capsule   Hormone replacement therapy (HRT)       Relevant Medications   estradiol (ESTRACE) 0.5 MG tablet   progesterone (PROMETRIUM) 100 MG capsule      Plan: We discussed perimenopause and what to expect. She would like to try HRT at this time. We discussed the benefits of symptom management, cardiac and bone health, as well as the risks of blood clots, heart attack, stroke, and breast cancer.  Decrease Lexapro back to 20 mg (may take 30 mg every other day for a week end then continue with 20 mg). History of tubal ligation, no contraception needed. She does have well-controlled hypertension and will monitor BP at home. All questions answered.      [place], 1:51 PM 08/16/2020

## 2020-08-23 ENCOUNTER — Ambulatory Visit (AMBULATORY_SURGERY_CENTER): Payer: 59

## 2020-08-23 ENCOUNTER — Other Ambulatory Visit: Payer: Self-pay

## 2020-08-23 VITALS — Ht 61.0 in | Wt 138.0 lb

## 2020-08-23 DIAGNOSIS — Z1211 Encounter for screening for malignant neoplasm of colon: Secondary | ICD-10-CM

## 2020-08-23 NOTE — Progress Notes (Signed)
Patient's pre-visit was done today over the phone with the patient Name,DOB and address verified. Patient denies any allergies to Eggs and Soy. Patient denies any problems with anesthesia/sedation. Patient denies taking diet pills or blood thinners. No home Oxygen. Packet of Prep instructions mailed to patient including a copy of a consent form-pt is aware. Patient understands to call us back with any questions or concerns. Patient is aware of our care-partner policy and 0000000 safety protocol.   EMMI education assigned to the patient for the procedure, sent to [place].   The patient is COVID-19 vaccinated.

## 2020-08-31 ENCOUNTER — Other Ambulatory Visit: Payer: Self-pay

## 2020-08-31 ENCOUNTER — Encounter: Payer: Self-pay | Admitting: Internal Medicine

## 2020-08-31 ENCOUNTER — Ambulatory Visit (AMBULATORY_SURGERY_CENTER): Payer: 59 | Admitting: Internal Medicine

## 2020-08-31 VITALS — BP 109/74 | HR 61 | Temp 98.6°F | Resp 14 | Ht 61.0 in | Wt 138.0 lb

## 2020-08-31 DIAGNOSIS — Z1211 Encounter for screening for malignant neoplasm of colon: Secondary | ICD-10-CM

## 2020-08-31 MED ORDER — SODIUM CHLORIDE 0.9 % IV SOLN
500.0000 mL | Freq: Once | INTRAVENOUS | Status: DC
Start: 2020-08-31 — End: 2020-08-31

## 2020-08-31 NOTE — Progress Notes (Signed)
A/ox3, pleased with MAC, report to RN 

## 2020-08-31 NOTE — Op Note (Signed)
[facility] Patient Name: Leslie Ritter Procedure Date: 08/31/2020 2:03 PM MRN: [id number] Endoscopist: Gatha Mayer , MD Age: 50 Referring MD:  Date of Birth: 02/05/1970 Gender: Female Account #: [id number] Procedure:                Colonoscopy Indications:              Screening for colorectal malignant neoplasm, This                            is the patient's first colonoscopy Medicines:                Propofol per Anesthesia, Monitored Anesthesia Care Procedure:                Pre-Anesthesia Assessment:                           - Prior to the procedure, a History and Physical                            was performed, and patient medications and                            allergies were reviewed. The patient's tolerance of                            previous anesthesia was also reviewed. The risks                            and benefits of the procedure and the sedation                            options and risks were discussed with the patient.                            All questions were answered, and informed consent                            was obtained. Prior Anticoagulants: The patient has                            taken no previous anticoagulant or antiplatelet                            agents. ASA Grade Assessment: II - A patient with                            mild systemic disease. After reviewing the risks                            and benefits, the patient was deemed in                            satisfactory condition to undergo the procedure.  After obtaining informed consent, the colonoscope                            was passed under direct vision. Throughout the                            procedure, the patient's blood pressure, pulse, and                            oxygen saturations were monitored continuously. The                            PCF-HQ190L Colonoscope was introduced through the                             anus and advanced to the the cecum, identified by                            appendiceal orifice and ileocecal valve. The                            colonoscopy was performed without difficulty. The                            patient tolerated the procedure well. The quality                            of the bowel preparation was good. The ileocecal                            valve, appendiceal orifice, and rectum were                            photographed. The bowel preparation used was                            Miralax via split dose instruction. Scope In: 2:20:21 PM Scope Out: 2:32:36 PM Scope Withdrawal Time: 0 hours 9 minutes 11 seconds  Total Procedure Duration: 0 hours 12 minutes 15 seconds  Findings:                 The perianal and digital rectal examinations were                            normal.                           A few diverticula were found in the sigmoid colon.                           The exam was otherwise without abnormality on                            direct and retroflexion views. Complications:  No immediate complications. Estimated Blood Loss:     Estimated blood loss: none. Impression:               - Diverticulosis in the sigmoid colon.                           - The examination was otherwise normal on direct                            and retroflexion views.                           - No specimens collected. Recommendation:           - Patient has a contact number available for                            emergencies. The signs and symptoms of potential                            delayed complications were discussed with the                            patient. Return to normal activities tomorrow.                            Written discharge instructions were provided to the                            patient.                           - Resume previous diet.                           - Continue present medications.                            - Repeat colonoscopy in 10 years for screening                            purposes. Gatha Mayer, MD 08/31/2020 2:41:34 PM This report has been signed electronically.

## 2020-08-31 NOTE — Progress Notes (Signed)
Pt's states no medical or surgical changes since previsit or office visit.  BC IV.

## 2020-08-31 NOTE — Progress Notes (Signed)
[facility] History and Physical   Primary Care Physician:  Chevis Pretty, FNP   Reason for Procedure:   Colon cancer screening  Plan:    colonoscopy     HPI: Leslie Ritter is a 50 y.o. 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 Authorizing Provider  Ascorbic Acid (VITA-C PO) Take by mouth.   Yes [provider]  Biotin w/ Vitamins C & E (HAIR/SKIN/NAILS PO) Take by mouth.   Yes [provider]  clonazePAM (KLONOPIN) 0.5 MG tablet Take 1 tablet (0.5 mg total) by mouth 2 (two) times daily as needed for anxiety. 04/11/20  Yes Martin, Mary-Margaret, FNP  escitalopram (LEXAPRO) 20 MG tablet Take 1 tablet (20 mg total) by mouth daily. 04/11/20  Yes Martin, Mary-Margaret, FNP  estradiol (ESTRACE) 0.5 MG tablet Take 1 tablet (0.5 mg total) by mouth daily. 08/16/20  Yes Marny Lowenstein A, NP  lisinopril-hydrochlorothiazide (ZESTORETIC) 20-12.5 MG tablet Take 1 tablet by mouth daily. 04/11/20  Yes Hassell Done, Mary-Margaret, FNP  Multiple Vitamins-Minerals (MULTIVITAL PO) Take by mouth.   Yes [provider]  progesterone (PROMETRIUM) 100 MG capsule Take 1 capsule (100 mg total) by mouth daily. 08/16/20  Yes Tamela Gammon, NP  valACYclovir (VALTREX) 500 MG tablet Take twice daily for 3 to 5 days as needed 04/11/20   Chevis Pretty, FNP    Current Outpatient Medications  Medication Sig Dispense Refill   Ascorbic Acid (VITA-C PO) Take by mouth.     Biotin w/ Vitamins C & E (HAIR/SKIN/NAILS PO) Take by mouth.     clonazePAM (KLONOPIN) 0.5 MG tablet Take 1 tablet (0.5 mg total) by mouth 2 (two) times daily as needed for anxiety. 60 tablet 5   escitalopram (LEXAPRO) 20 MG tablet Take 1 tablet (20 mg total) by mouth daily. 90 tablet 1   estradiol (ESTRACE) 0.5 MG tablet Take 1 tablet (0.5 mg total) by mouth daily. 90 tablet 2   lisinopril-hydrochlorothiazide (ZESTORETIC) 20-12.5 MG tablet Take 1 tablet by mouth daily. 90 tablet 1   Multiple Vitamins-Minerals (MULTIVITAL PO) Take by mouth.     progesterone (PROMETRIUM) 100 MG capsule Take 1 capsule (100 mg total) by mouth daily. 90 capsule 2   valACYclovir (VALTREX) 500 MG tablet Take twice daily for 3 to 5 days as needed 30 tablet 12   Current Facility-Administered Medications  Medication Dose Route Frequency Provider Last Rate Last Admin   0.9 %  sodium chloride infusion  500 mL Intravenous Once Gatha Mayer, MD        Allergies as of 08/31/2020   (No Known Allergies)    Family History  Problem Relation Age of  Onset   Hypertension Mother    Hypertension Sister    Diabetes Maternal Grandmother    Hypertension Maternal Grandfather    Heart disease Maternal Grandfather    Colon cancer Neg Hx    Colon polyps Neg Hx    Esophageal cancer Neg Hx    Stomach cancer Neg Hx    Rectal cancer Neg Hx     Social History   Socioeconomic History   Marital status: Divorced    Spouse name: Not on file   Number of children: Not on file   Years of education: Not on file   Highest education level: Not on file  Occupational History   Not on file  Tobacco Use   Smoking status: Never   Smokeless tobacco: Never  Vaping Use   Vaping Use: Never  used  Substance and Sexual Activity   Alcohol use: Yes    Alcohol/week: 0.0 standard drinks    Comment: weekends WINE   Drug use: No   Sexual activity: Yes    Birth control/protection: Surgical    Comment: Tubal lig, INTERCOURSE AGE 54,SEXUAL PARTNERS MORE THAN 5  Other Topics Concern   Not on file  Social History Narrative   Not on file   Social Determinants of Health   Financial Resource Strain: Not on file  Food Insecurity: Not on file  Transportation Needs: Not on file  Physical Activity: Not on file  Stress: Not on file  Social Connections: Not on file  Intimate Partner Violence: Not on file    Review of Systems:  All other review of systems negative except as mentioned in the HPI.  Physical Exam: Vital signs BP 115/89   Pulse 79   Temp 98.6 F (37 C)   Ht '5\' 1"'$  (1.549 m)   Wt 138 lb (62.6 kg)   LMP 08/31/2020   SpO2 98%   BMI 26.07 kg/m   General:   Alert,  Well-developed, well-nourished, pleasant and cooperative in NAD Lungs:  Clear throughout to auscultation.   Heart:  Regular rate and rhythm; no murmurs, clicks, rubs,  or gallops. Abdomen:  Soft, nontender and nondistended. Normal bowel sounds.   Neuro/Psych:  Alert and cooperative. Normal mood and affect. A and O x 3   '@Chanti Golubski'$  Simonne Maffucci, MD, [facility] [phone] (pager) 08/31/2020 2:17 PM@

## 2020-08-31 NOTE — Patient Instructions (Addendum)
Good news - no polyps or cancer.   You do have mild diverticulosis - thickened muscle rings and pouches in the colon wall. Please read the handout about this condition.   I appreciate the opportunity to care for you. Gatha Mayer, MD, [facility]  Please read handouts provided. Continue present medications. Repeat colonoscopy in 10 years for screening.   YOU HAD AN ENDOSCOPIC PROCEDURE TODAY AT [facility]:   Refer to the procedure report that was given to you for any specific questions about what was found during the examination.  If the procedure report does not answer your questions, please call your gastroenterologist to clarify.  If you requested that your care partner not be given the details of your procedure findings, then the procedure report has been included in a sealed envelope for you to review at your convenience later.  YOU SHOULD EXPECT: Some feelings of bloating in the abdomen. Passage of more gas than usual.  Walking can help get rid of the air that was put into your GI tract during the procedure and reduce the bloating. If you had a lower endoscopy (such as a colonoscopy or flexible sigmoidoscopy) you may notice spotting of blood in your stool or on the toilet paper. If you underwent a bowel prep for your procedure, you may not have a normal bowel movement for a few days.  Please Note:  You might notice some irritation and congestion in your nose or some drainage.  This is from the oxygen used during your procedure.  There is no need for concern and it should clear up in a day or so.  SYMPTOMS TO REPORT IMMEDIATELY:  Following lower endoscopy (colonoscopy or flexible sigmoidoscopy):  Excessive amounts of blood in the stool  Significant tenderness or worsening of abdominal pains  Swelling of the abdomen that is new, acute  Fever of 100F or higher   For urgent or emergent issues, a gastroenterologist can be reached at any hour by calling [phone]. Do  not use MyChart messaging for urgent concerns.    DIET:  We do recommend a small meal at first, but then you may proceed to your regular diet.  Drink plenty of fluids but you should avoid alcoholic beverages for 24 hours.  ACTIVITY:  You should plan to take it easy for the rest of today and you should NOT DRIVE or use heavy machinery until tomorrow (because of the sedation medicines used during the test).    FOLLOW UP: Our staff will call the number listed on your records 48-72 hours following your procedure to check on you and address any questions or concerns that you may have regarding the information given to you following your procedure. If we do not reach you, we will leave a message.  We will attempt to reach you two times.  During this call, we will ask if you have developed any symptoms of COVID 19. If you develop any symptoms (ie: fever, flu-like symptoms, shortness of breath, cough etc.) before then, please call [phone].  If you test positive for Covid 19 in the 2 weeks post procedure, please call and report this information to [place].    If any biopsies were taken you will be contacted by phone or by letter within the next 1-3 weeks.  Please call us at [phone] if you have not heard about the biopsies in 3 weeks.    SIGNATURES/CONFIDENTIALITY: You and/or your care partner have signed paperwork which will be entered  into your electronic medical record.  These signatures attest to the fact that that the information above on your After Visit Summary has been reviewed and is understood.  Full responsibility of the confidentiality of this discharge information lies with you and/or your care-partner.

## 2020-09-04 ENCOUNTER — Telehealth: Payer: Self-pay | Admitting: *Deleted

## 2020-09-04 NOTE — Telephone Encounter (Signed)
  Follow up Call-  Call back number 08/31/2020  Post procedure Call Back phone  # [phone]  Permission to leave phone message Yes  Some recent data might be hidden   [facility]

## 2020-09-04 NOTE — Telephone Encounter (Signed)
  Follow up Call-  Call back number 08/31/2020  Post procedure Call Back phone  # [phone]  Permission to leave phone message Yes  Some recent data might be hidden     Patient questions: Message left to call us if necessary. Second call.

## 2020-10-05 ENCOUNTER — Telehealth: Payer: Self-pay | Admitting: *Deleted

## 2020-10-05 ENCOUNTER — Other Ambulatory Visit: Payer: Self-pay | Admitting: Nurse Practitioner

## 2020-10-05 DIAGNOSIS — I10 Essential (primary) hypertension: Secondary | ICD-10-CM

## 2020-10-05 DIAGNOSIS — F411 Generalized anxiety disorder: Secondary | ICD-10-CM

## 2020-10-05 NOTE — Telephone Encounter (Signed)
Patient called requesting a higher dose in her Hormone replacement therapy. Patient complains of Anxiety. I will forward this message to Provider for recommendations.

## 2020-10-08 ENCOUNTER — Other Ambulatory Visit: Payer: Self-pay | Admitting: Nurse Practitioner

## 2020-10-08 DIAGNOSIS — Z79890 Hormone replacement therapy (postmenopausal): Secondary | ICD-10-CM

## 2020-10-08 MED ORDER — ESTRADIOL 1 MG PO TABS: 1.0000 mg | ORAL_TABLET | Freq: Every day | ORAL | 0 refills | Status: DC

## 2020-10-08 NOTE — Telephone Encounter (Signed)
Patient aware of recommendations.  

## 2020-10-08 NOTE — Telephone Encounter (Signed)
Please let her know I have increased to Estradiol 1 mg tablets. Make sure she continues to check her BP routinely. Thanks.

## 2020-10-12 ENCOUNTER — Other Ambulatory Visit: Payer: Self-pay

## 2020-10-12 ENCOUNTER — Ambulatory Visit: Payer: 59 | Admitting: Nurse Practitioner

## 2020-10-12 ENCOUNTER — Encounter: Payer: Self-pay | Admitting: Nurse Practitioner

## 2020-10-12 VITALS — BP 120/82 | HR 68 | Temp 98.3°F | Ht 61.0 in | Wt 139.2 lb

## 2020-10-12 DIAGNOSIS — I10 Essential (primary) hypertension: Secondary | ICD-10-CM

## 2020-10-12 DIAGNOSIS — Z23 Encounter for immunization: Secondary | ICD-10-CM | POA: Diagnosis not present

## 2020-10-12 DIAGNOSIS — Z6826 Body mass index (BMI) 26.0-26.9, adult: Secondary | ICD-10-CM | POA: Diagnosis not present

## 2020-10-12 DIAGNOSIS — F3342 Major depressive disorder, recurrent, in full remission: Secondary | ICD-10-CM

## 2020-10-12 DIAGNOSIS — F411 Generalized anxiety disorder: Secondary | ICD-10-CM | POA: Diagnosis not present

## 2020-10-12 MED ORDER — LISINOPRIL-HYDROCHLOROTHIAZIDE 20-12.5 MG PO TABS: 1.0000 | ORAL_TABLET | Freq: Every day | ORAL | 1 refills | Status: DC

## 2020-10-12 MED ORDER — ESCITALOPRAM OXALATE 20 MG PO TABS: 20.0000 mg | ORAL_TABLET | Freq: Every day | ORAL | 1 refills | Status: DC

## 2020-10-12 MED ORDER — CLONAZEPAM 0.5 MG PO TABS: 0.5000 mg | ORAL_TABLET | Freq: Two times a day (BID) | ORAL | 5 refills | Status: DC | PRN

## 2020-10-12 NOTE — Progress Notes (Signed)
Subjective:    Patient ID: Leslie Ritter, female    DOB: [birth year], 50 y.o.   MRN: [id number] medical management of chronic issues     HPI:  1. Primary hypertension No c/o chest pain, sob or headache. Does check blood pressure occasionally. BP Readings from Last 3 Encounters:  10/12/20 120/82  08/31/20 109/74  08/16/20 118/78    2. GAD (generalized anxiety disorder) Is on klonopin and usually takes 2x a day GAD 7 : Generalized Anxiety Score 10/12/2020 04/11/2020 10/13/2019 03/21/2019  Nervous, Anxious, on Edge '2 1 1 1  ' Control/stop worrying '1 1 1 1  ' Worry too much - different things '1 1 1 ' 0  Trouble relaxing 2 0 1 0  Restless '1 1 1 ' 0  Easily annoyed or irritable 2 1 0 0  Afraid - awful might happen 1 0 0 0  Total GAD 7 Score '10 5 5 2  ' Anxiety Difficulty Somewhat difficult Not difficult at all Somewhat difficult Somewhat difficult      3. Recurrent major depressive disorder, in full remission ([place]) Is on lexapro and is doing well. Depression screen [facility] 2/9 10/12/2020 04/11/2020 10/13/2019  Decreased Interest 1 0 0  Down, Depressed, Hopeless 1 0 0  PHQ - 2 Score 2 0 0  Altered sleeping 1 0 1  Tired, decreased energy '1 1 1  ' Change in appetite 0 0 0  Feeling bad or failure about yourself  2 0 0  Trouble concentrating 1 0 0  Moving slowly or fidgety/restless 0 0 0  Suicidal thoughts 0 0 0  PHQ-9 Score '7 1 2  ' Difficult doing work/chores Somewhat difficult Not difficult at all Not difficult at all  Some recent data might be hidden     4. BMI 26.0-26.9,adult No recent weight changes Wt Readings from Last 3 Encounters:  10/12/20 139 lb 3.2 oz (63.1 kg)  08/31/20 138 lb (62.6 kg)  08/23/20 138 lb (62.6 kg)   BMI Readings from Last 3 Encounters:  10/12/20 26.30 kg/m  08/31/20 26.07 kg/m  08/23/20 26.07 kg/m      Outpatient Encounter Medications as of 10/12/2020  Medication Sig   Ascorbic Acid (VITA-C PO) Take by mouth.   Biotin w/ Vitamins C & E  (HAIR/SKIN/NAILS PO) Take by mouth.   clonazePAM (KLONOPIN) 0.5 MG tablet Take 1 tablet (0.5 mg total) by mouth 2 (two) times daily as needed for anxiety.   escitalopram (LEXAPRO) 20 MG tablet Take 1 tablet by mouth once daily   estradiol (ESTRACE) 1 MG tablet Take 1 tablet (1 mg total) by mouth daily.   lisinopril-hydrochlorothiazide (ZESTORETIC) 20-12.5 MG tablet Take 1 tablet by mouth once daily   Multiple Vitamins-Minerals (MULTIVITAL PO) Take by mouth.   progesterone (PROMETRIUM) 100 MG capsule Take 1 capsule (100 mg total) by mouth daily.   valACYclovir (VALTREX) 500 MG tablet Take twice daily for 3 to 5 days as needed   No facility-administered encounter medications on file as of 10/12/2020.    Past Surgical History:  Procedure Laterality Date   Arm surg-Radial nerve     GYNECOLOGIC CRYOSURGERY  1992   LAPAROSCOPIC TUBAL LIGATION Bilateral 03/30/2012   Procedure: LAPAROSCOPIC TUBAL LIGATION;  Surgeon: Anastasio Auerbach, MD;  Location: [facility];  Service: Gynecology;  Laterality: Bilateral;   mole removed     face - left cheek   TENDON RECONSTRUCTION Right    WISDOM TOOTH EXTRACTION      Family History  Problem Relation Age of Onset  Hypertension Mother    Hypertension Sister    Diabetes Maternal Grandmother    Hypertension Maternal Grandfather    Heart disease Maternal Grandfather    Colon cancer Neg Hx    Colon polyps Neg Hx    Esophageal cancer Neg Hx    Stomach cancer Neg Hx    Rectal cancer Neg Hx     New complaints: None today  Social history: Lives with her fiance  Controlled substance contract: 04/18/20     Review of Systems  Constitutional:  Negative for diaphoresis.  Eyes:  Negative for pain.  Respiratory:  Negative for shortness of breath.   Cardiovascular:  Negative for chest pain, palpitations and leg swelling.  Gastrointestinal:  Negative for abdominal pain.  Endocrine: Negative for polydipsia.  Skin:  Negative for rash.  Neurological:   Negative for dizziness, weakness and headaches.  Hematological:  Does not bruise/bleed easily.  All other systems reviewed and are negative.     Objective:   Physical Exam Vitals and nursing note reviewed.  Constitutional:      General: She is not in acute distress.    Appearance: Normal appearance. She is well-developed.  HENT:     Head: Normocephalic.     Right Ear: Tympanic membrane normal.     Left Ear: Tympanic membrane normal.     Nose: Nose normal.     Mouth/Throat:     Mouth: Mucous membranes are moist.  Eyes:     Pupils: Pupils are equal, round, and reactive to light.  Neck:     Vascular: No carotid bruit or JVD.  Cardiovascular:     Rate and Rhythm: Normal rate and regular rhythm.     Heart sounds: Normal heart sounds.  Pulmonary:     Effort: Pulmonary effort is normal. No respiratory distress.     Breath sounds: Normal breath sounds. No wheezing or rales.  Chest:     Chest wall: No tenderness.  Abdominal:     General: Bowel sounds are normal. There is no distension or abdominal bruit.     Palpations: Abdomen is soft. There is no hepatomegaly, splenomegaly, mass or pulsatile mass.     Tenderness: There is no abdominal tenderness.  Musculoskeletal:        General: Normal range of motion.     Cervical back: Normal range of motion and neck supple.  Lymphadenopathy:     Cervical: No cervical adenopathy.  Skin:    General: Skin is warm and dry.  Neurological:     Mental Status: She is alert and oriented to person, place, and time.     Deep Tendon Reflexes: Reflexes are normal and symmetric.  Psychiatric:        Behavior: Behavior normal.        Thought Content: Thought content normal.        Judgment: Judgment normal.    BP 120/82   Pulse 68   Temp 98.3 F (36.8 C) (Temporal)   Ht '5\' 1"'  (1.549 m)   Wt 139 lb 3.2 oz (63.1 kg)   SpO2 95%   BMI 26.30 kg/m        Assessment & Plan:   Trish Mage Creveling comes in today with chief complaint of Medical  Management of Chronic Issues   Diagnosis and orders addressed:  1. Primary hypertension Low sodium diet - lisinopril-hydrochlorothiazide (ZESTORETIC) 20-12.5 MG tablet; Take 1 tablet by mouth daily.  Dispense: 90 tablet; Refill: 1 - CBC with Differential/Platelet - CMP14+EGFR - Lipid  panel  2. GAD (generalized anxiety disorder) Stress management - escitalopram (LEXAPRO) 20 MG tablet; Take 1 tablet (20 mg total) by mouth daily.  Dispense: 90 tablet; Refill: 1 - clonazePAM (KLONOPIN) 0.5 MG tablet; Take 1 tablet (0.5 mg total) by mouth 2 (two) times daily as needed for anxiety.  Dispense: 60 tablet; Refill: 5  3. Recurrent major depressive disorder, in full remission ([place])  4. BMI 26.0-26.9,adult Discussed diet and exercise for person with BMI >25 Will recheck weight in 3-6 months     Labs pending Health Maintenance reviewed Diet and exercise encouraged  Follow up plan: 6 months   Mary-Margaret Hassell Done, FNP

## 2020-10-12 NOTE — Patient Instructions (Signed)

## 2020-10-13 LAB — CBC WITH DIFFERENTIAL/PLATELET
Basophils Absolute: 0 10*3/uL (ref 0.0–0.2)
Basos: 0 %
EOS (ABSOLUTE): 0.1 10*3/uL (ref 0.0–0.4)
Eos: 1 %
Hematocrit: 41 % (ref 34.0–46.6)
Hemoglobin: 14.2 g/dL (ref 11.1–15.9)
Immature Grans (Abs): 0 10*3/uL (ref 0.0–0.1)
Immature Granulocytes: 0 %
Lymphocytes Absolute: 2 10*3/uL (ref 0.7–3.1)
Lymphs: 38 %
MCH: 32.4 pg (ref 26.6–33.0)
MCHC: 34.6 g/dL (ref 31.5–35.7)
MCV: 94 fL (ref 79–97)
Monocytes Absolute: 0.5 10*3/uL (ref 0.1–0.9)
Monocytes: 10 %
Neutrophils Absolute: 2.6 10*3/uL (ref 1.4–7.0)
Neutrophils: 51 %
Platelets: 312 10*3/uL (ref 150–450)
RBC: 4.38 x10E6/uL (ref 3.77–5.28)
RDW: 11.6 % — ABNORMAL LOW (ref 11.7–15.4)
WBC: 5.2 10*3/uL (ref 3.4–10.8)

## 2020-10-13 LAB — CMP14+EGFR
ALT: 13 IU/L (ref 0–32)
AST: 14 IU/L (ref 0–40)
Albumin/Globulin Ratio: 2 (ref 1.2–2.2)
Albumin: 4.7 g/dL (ref 3.8–4.8)
Alkaline Phosphatase: 47 IU/L (ref 44–121)
BUN/Creatinine Ratio: 16 (ref 9–23)
BUN: 12 mg/dL (ref 6–24)
Bilirubin Total: 0.6 mg/dL (ref 0.0–1.2)
CO2: 23 mmol/L (ref 20–29)
Calcium: 9.3 mg/dL (ref 8.7–10.2)
Chloride: 99 mmol/L (ref 96–106)
Creatinine, Ser: 0.75 mg/dL (ref 0.57–1.00)
Globulin, Total: 2.4 g/dL (ref 1.5–4.5)
Glucose: 93 mg/dL (ref 65–99)
Potassium: 4.3 mmol/L (ref 3.5–5.2)
Sodium: 140 mmol/L (ref 134–144)
Total Protein: 7.1 g/dL (ref 6.0–8.5)
eGFR: 97 mL/min/{1.73_m2} (ref >59)

## 2020-10-13 LAB — LIPID PANEL
Chol/HDL Ratio: 2 ratio (ref 0.0–4.4)
Cholesterol, Total: 167 mg/dL (ref 100–199)
HDL: 84 mg/dL (ref >39)
LDL Chol Calc (NIH): 72 mg/dL (ref 0–99)
Triglycerides: 57 mg/dL (ref 0–149)
VLDL Cholesterol Cal: 11 mg/dL (ref 5–40)

## 2021-01-03 ENCOUNTER — Other Ambulatory Visit: Payer: Self-pay | Admitting: Nurse Practitioner

## 2021-01-03 DIAGNOSIS — Z79890 Hormone replacement therapy (postmenopausal): Secondary | ICD-10-CM

## 2021-01-03 NOTE — Telephone Encounter (Signed)
AEX 04/19/2020

## 2021-03-05 ENCOUNTER — Other Ambulatory Visit: Payer: Self-pay

## 2021-03-05 DIAGNOSIS — Z79890 Hormone replacement therapy (postmenopausal): Secondary | ICD-10-CM

## 2021-03-05 NOTE — Telephone Encounter (Signed)
Last AEX 04/19/20. Scheduled for 05/02/21. Mammo UTD.

## 2021-03-06 MED ORDER — ESTRADIOL 1 MG PO TABS: 1.0000 mg | ORAL_TABLET | Freq: Every day | ORAL | 0 refills | Status: DC

## 2021-04-11 ENCOUNTER — Ambulatory Visit: Payer: 59 | Admitting: Nurse Practitioner

## 2021-04-11 ENCOUNTER — Encounter: Payer: Self-pay | Admitting: Nurse Practitioner

## 2021-04-11 VITALS — BP 101/71 | HR 84 | Temp 98.3°F | Resp 20 | Ht 61.0 in | Wt 144.0 lb

## 2021-04-11 DIAGNOSIS — F3342 Major depressive disorder, recurrent, in full remission: Secondary | ICD-10-CM | POA: Diagnosis not present

## 2021-04-11 DIAGNOSIS — Z23 Encounter for immunization: Secondary | ICD-10-CM

## 2021-04-11 DIAGNOSIS — B009 Herpesviral infection, unspecified: Secondary | ICD-10-CM

## 2021-04-11 DIAGNOSIS — F411 Generalized anxiety disorder: Secondary | ICD-10-CM

## 2021-04-11 DIAGNOSIS — Z6827 Body mass index (BMI) 27.0-27.9, adult: Secondary | ICD-10-CM

## 2021-04-11 DIAGNOSIS — I10 Essential (primary) hypertension: Secondary | ICD-10-CM

## 2021-04-11 MED ORDER — VALACYCLOVIR HCL 500 MG PO TABS: ORAL_TABLET | ORAL | 12 refills | Status: DC

## 2021-04-11 MED ORDER — LISINOPRIL-HYDROCHLOROTHIAZIDE 20-12.5 MG PO TABS: 1.0000 | ORAL_TABLET | Freq: Every day | ORAL | 1 refills | Status: DC

## 2021-04-11 MED ORDER — ESCITALOPRAM OXALATE 20 MG PO TABS: 20.0000 mg | ORAL_TABLET | Freq: Every day | ORAL | 1 refills | Status: DC

## 2021-04-11 MED ORDER — CLONAZEPAM 0.5 MG PO TABS: 0.5000 mg | ORAL_TABLET | Freq: Two times a day (BID) | ORAL | 5 refills | Status: DC | PRN

## 2021-04-11 NOTE — Addendum Note (Signed)
Addended by: Rolena Infante on: 04/11/2021 03:45 PM ? ? Modules accepted: Orders ? ?

## 2021-04-11 NOTE — Progress Notes (Signed)
? ?Subjective:  ? ? Patient ID: Leslie Ritter, female    DOB: 10/19/1970, 51 y.o.   MRN: [id number] ? ? ?Chief Complaint: medical management of chronic issues  ?  ? ?HPI: ? ?Leslie Ritter is a 51 y.o. who identifies as a female who was assigned female at birth.  ? ?Social history: ?Lives with: her fiance ?Work history: works in Clinical biochemist office ? ? ?Comes in today for follow up of the following chronic medical issues: ? ?1. Primary hypertension ?No c/o chest pain, sob or headache. Does not check blood pressure at home. ?BP Readings from Last 3 Encounters:  ?10/12/20 120/82  ?08/31/20 109/74  ?08/16/20 118/78  ? ? ? ?2. Recurrent major depressive disorder, in full remission ([place]) ?Is on lexapro daily and is doing well. No medication side effects. She is under a lot of stress, because her daughter in law will not let her see her grandchild. It is really getting the best of her. ? ?  04/11/2021  ?  2:35 PM 10/12/2020  ? 12:19 PM 04/11/2020  ?  3:53 PM  ?Depression screen PHQ 2/9  ?Decreased Interest 2 1 0  ?Down, Depressed, Hopeless 2 1 0  ?PHQ - 2 Score 4 2 0  ?Altered sleeping 2 1 0  ?Tired, decreased energy '2 1 1  '$ ?Change in appetite 2 0 0  ?Feeling bad or failure about yourself  3 2 0  ?Trouble concentrating 2 1 0  ?Moving slowly or fidgety/restless 1 0 0  ?Suicidal thoughts 0 0 0  ?PHQ-9 Score '16 7 1  '$ ?Difficult doing work/chores Somewhat difficult Somewhat difficult Not difficult at all  ? ? ? ?3. GAD (generalized anxiety disorder) ?Is on klonopin BID. Gets very anxious without medication. ? ?  04/11/2021  ?  2:35 PM 10/12/2020  ? 12:19 PM 04/11/2020  ?  3:53 PM 10/13/2019  ?  3:48 PM  ?GAD 7 : Generalized Anxiety Score  ?Nervous, Anxious, on Edge '3 2 1 1  '$ ?Control/stop worrying '3 1 1 1  '$ ?Worry too much - different things '3 1 1 1  '$ ?Trouble relaxing 3 2 0 1  ?Restless '2 1 1 1  '$ ?Easily annoyed or irritable '3 2 1 '$ 0  ?Afraid - awful might happen 2 1 0 0  ?Total GAD 7 Score '19 10 5 5  '$ ?Anxiety Difficulty  Somewhat difficult Somewhat difficult Not difficult at all Somewhat difficult  ? ? ? ? ?4. BMI 26.0-26.9,adult ?No recent weight changes ?Wt Readings from Last 3 Encounters:  ?04/11/21 144 lb (65.3 kg)  ?10/12/20 139 lb 3.2 oz (63.1 kg)  ?08/31/20 138 lb (62.6 kg)  ? ?BMI Readings from Last 3 Encounters:  ?04/11/21 27.21 kg/m?  ?10/12/20 26.30 kg/m?  ?08/31/20 26.07 kg/m?  ? ? ? ? ?New complaints: ?None today ? ?No Known Allergies ?Outpatient Encounter Medications as of 04/11/2021  ?Medication Sig  ? Ascorbic Acid (VITA-C PO) Take by mouth.  ? Biotin w/ Vitamins C & E (HAIR/SKIN/NAILS PO) Take by mouth.  ? clonazePAM (KLONOPIN) 0.5 MG tablet Take 1 tablet (0.5 mg total) by mouth 2 (two) times daily as needed for anxiety.  ? escitalopram (LEXAPRO) 20 MG tablet Take 1 tablet (20 mg total) by mouth daily.  ? estradiol (ESTRACE) 1 MG tablet Take 1 tablet (1 mg total) by mouth daily.  ? lisinopril-hydrochlorothiazide (ZESTORETIC) 20-12.5 MG tablet Take 1 tablet by mouth daily.  ? Multiple Vitamins-Minerals (MULTIVITAL PO) Take by mouth.  ? progesterone (PROMETRIUM) 100  MG capsule Take 1 capsule (100 mg total) by mouth daily.  ? valACYclovir (VALTREX) 500 MG tablet Take twice daily for 3 to 5 days as needed  ? ?No facility-administered encounter medications on file as of 04/11/2021.  ? ? ?Past Surgical History:  ?Procedure Laterality Date  ? Arm surg-Radial nerve    ? GYNECOLOGIC CRYOSURGERY  1992  ? LAPAROSCOPIC TUBAL LIGATION Bilateral 03/30/2012  ? Procedure: LAPAROSCOPIC TUBAL LIGATION;  Surgeon: Anastasio Auerbach, MD;  Location: [facility];  Service: Gynecology;  Laterality: Bilateral;  ? mole removed    ? face - left cheek  ? TENDON RECONSTRUCTION Right   ? WISDOM TOOTH EXTRACTION    ? ? ?Family History  ?Problem Relation Age of Onset  ? Hypertension Mother   ? Hypertension Sister   ? Diabetes Maternal Grandmother   ? Hypertension Maternal Grandfather   ? Heart disease Maternal Grandfather   ? Colon cancer Neg Hx   ?  Colon polyps Neg Hx   ? Esophageal cancer Neg Hx   ? Stomach cancer Neg Hx   ? Rectal cancer Neg Hx   ? ? ? ? ?Controlled substance contract: n/a ? ? ? ? ?Review of Systems  ?Constitutional:  Negative for diaphoresis.  ?Eyes:  Negative for pain.  ?Respiratory:  Negative for shortness of breath.   ?Cardiovascular:  Negative for chest pain, palpitations and leg swelling.  ?Gastrointestinal:  Negative for abdominal pain.  ?Endocrine: Negative for polydipsia.  ?Skin:  Negative for rash.  ?Neurological:  Negative for dizziness, weakness and headaches.  ?Hematological:  Does not bruise/bleed easily.  ?All other systems reviewed and are negative. ? ?   ?Objective:  ? Physical Exam ?Vitals and nursing note reviewed.  ?Constitutional:   ?   General: She is not in acute distress. ?   Appearance: Normal appearance. She is well-developed.  ?HENT:  ?   Head: Normocephalic.  ?   Right Ear: Tympanic membrane normal.  ?   Left Ear: Tympanic membrane normal.  ?   Nose: Nose normal.  ?   Mouth/Throat:  ?   Mouth: Mucous membranes are moist.  ?Eyes:  ?   Pupils: Pupils are equal, round, and reactive to light.  ?Neck:  ?   Vascular: No carotid bruit or JVD.  ?Cardiovascular:  ?   Rate and Rhythm: Normal rate and regular rhythm.  ?   Heart sounds: Normal heart sounds.  ?Pulmonary:  ?   Effort: Pulmonary effort is normal. No respiratory distress.  ?   Breath sounds: Normal breath sounds. No wheezing or rales.  ?Chest:  ?   Chest wall: No tenderness.  ?Abdominal:  ?   General: Bowel sounds are normal. There is no distension or abdominal bruit.  ?   Palpations: Abdomen is soft. There is no hepatomegaly, splenomegaly, mass or pulsatile mass.  ?   Tenderness: There is no abdominal tenderness.  ?Musculoskeletal:     ?   General: Normal range of motion.  ?   Cervical back: Normal range of motion and neck supple.  ?Lymphadenopathy:  ?   Cervical: No cervical adenopathy.  ?Skin: ?   General: Skin is warm and dry.  ?Neurological:  ?   Mental  Status: She is alert and oriented to person, place, and time.  ?   Deep Tendon Reflexes: Reflexes are normal and symmetric.  ?Psychiatric:     ?   Behavior: Behavior normal.     ?   Thought Content: Thought content  normal.     ?   Judgment: Judgment normal.  ? ? ?BP 101/71   Pulse 84   Temp 98.3 ?F (36.8 ?C) (Temporal)   Resp 20   Ht '5\' 1"'$  (1.549 m)   Wt 144 lb (65.3 kg)   SpO2 100%   BMI 27.21 kg/m?  ? ? ? ?   ?Assessment & Plan:  ?Leslie Ritter in today with chief complaint of Medical Management of Chronic Issues ? ? ?1. Primary hypertension ?Low sodium diet ?- lisinopril-hydrochlorothiazide (ZESTORETIC) 20-12.5 MG tablet; Take 1 tablet by mouth daily.  Dispense: 90 tablet; Refill: 1 ? ?2. Recurrent major depressive disorder, in full remission ([place]) ?Stress management ?Continue lexapro ? ?3. GAD (generalized anxiety disorder) ?Stress management ?- escitalopram (LEXAPRO) 20 MG tablet; Take 1 tablet (20 mg total) by mouth daily.  Dispense: 90 tablet; Refill: 1 ?- clonazePAM (KLONOPIN) 0.5 MG tablet; Take 1 tablet (0.5 mg total) by mouth 2 (two) times daily as needed for anxiety.  Dispense: 60 tablet; Refill: 5 ? ?4. BMI 26.0-26.9,adult ?Discussed diet and exercise for person with BMI >25 ?Will recheck weight in 3-6 months ? ? ?5. HSV infection ?- valACYclovir (VALTREX) 500 MG tablet; Take twice daily for 3 to 5 days as needed  Dispense: 30 tablet; Refill: 12 ? ? ? ?The above assessment and management plan was discussed with the patient. The patient verbalized understanding of and has agreed to the management plan. Patient is aware to call the clinic if symptoms persist or worsen. Patient is aware when to return to the clinic for a follow-up visit. Patient educated on when it is appropriate to go to the emergency department.  ? ?Mary-Margaret Hassell Done, FNP ? ? ? ?

## 2021-04-11 NOTE — Patient Instructions (Signed)
Stress, Adult °Stress is a normal reaction to life events. Stress is what you feel when life demands more than you are used to, or more than you think you can handle. °Some stress can be useful, such as studying for a test or meeting a deadline at work. Stress that occurs too often or for too long can cause problems. Long-lasting stress is called chronic stress. Chronic stress can affect your emotional health and interfere with relationships and normal daily activities. °Too much stress can weaken your body's defense system (immune system) and increase your risk for physical illness. If you already have a medical problem, stress can make it worse. °What are the causes? °All sorts of life events can cause stress. An event that causes stress for one person may not be stressful for someone else. Major life events, whether positive or negative, commonly cause stress. Examples include: °Losing a job or starting a new job. °Losing a loved one. °Moving to a new town or home. °Getting married or divorced. °Having a baby. °Getting injured or sick. °Less obvious life events can also cause stress, especially if they occur day after day or in combination with each other. Examples include: °Working long hours. °Driving in traffic. °Caring for children. °Being in debt. °Being in a difficult relationship. °What are the signs or symptoms? °Stress can cause emotional and physical symptoms and can lead to unhealthy behaviors. These include the following: °Emotional symptoms °Anxiety. This is feeling worried, afraid, on edge, overwhelmed, or out of control. °Anger, including irritation or impatience. °Depression. This is feeling sad, down, helpless, or guilty. °Trouble focusing, remembering, or making decisions. °Physical symptoms °Aches and pains. These may affect your head, neck, back, stomach, or other areas of your body. °Tight muscles or a clenched jaw. °Low energy. °Trouble sleeping. °Unhealthy behaviors °Eating to feel better  (overeating) or skipping meals. °Working too much or putting off tasks. °Smoking, drinking alcohol, or using drugs to feel better. °How is this diagnosed? °A stress disorder is diagnosed through an assessment by your health care provider. A stress disorder may be diagnosed based on: °Your symptoms and any stressful life events. °Your medical history. °Tests to rule out other causes of your symptoms. °Depending on your condition, your health care provider may refer you to a specialist for further evaluation. °How is this treated? °Stress management techniques are the recommended treatment for stress. Medicine is not typically recommended for treating stress. °Techniques to reduce your reaction to stressful life events include: °Identifying stress. Monitor yourself for symptoms of stress and notice what causes stress for you. These skills may help you to avoid or prepare for stressful events. °Managing time. Set your priorities, keep a calendar of events, and learn to say no. These actions can help you avoid taking on too much. °Techniques for dealing with stress include: °Rethinking the problem. Try to think realistically about stressful events rather than ignoring them or overreacting. Try to find the positives in a stressful situation rather than focusing on the negatives. °Exercise. Physical exercise can release both physical and emotional tension. The key is to find a form of exercise that you enjoy and do it regularly. °Relaxation techniques. These relax the body and mind. Find one or more that you enjoy and use the techniques regularly. Examples include: °Meditation, deep breathing, or progressive relaxation techniques. °Yoga or tai chi. °Biofeedback, mindfulness techniques, or journaling. °Listening to music, being in nature, or taking part in other hobbies. °Practicing a healthy lifestyle. Eat a balanced diet,   drink plenty of water, limit or avoid caffeine, and get plenty of sleep. °Having a strong support  network. Spend time with family, friends, or other people you enjoy being around. Express your feelings and talk things over with someone you trust. °Counseling or talk therapy with a mental health provider may help if you are having trouble managing stress by yourself. °Follow these instructions at home: °Lifestyle ° °Avoid drugs. °Do not use any products that contain nicotine or tobacco. These products include cigarettes, chewing tobacco, and vaping devices, such as e-cigarettes. If you need help quitting, ask your health care provider. °If you drink alcohol: °Limit how much you have to: °0-1 drink a day for women who are not pregnant. °0-2 drinks a day for men. °Know how much alcohol is in a drink. In the [place] one drink equals one 12 oz bottle of beer (355 mL), one 5 oz glass of wine (148 mL), or one 1½ oz glass of hard liquor (44 mL). °Do not use alcohol or drugs to relax. °Eat a balanced diet that includes fresh fruits and vegetables, whole grains, lean meats, fish, eggs, beans, and low-fat dairy. Avoid processed foods and foods high in added fat, sugar, and salt. °Exercise at least 30 minutes on 5 or more days each week. °Get 7-8 hours of sleep each night. °General instructions ° °Practice stress management techniques as told by your health care provider. °Drink enough fluid to keep your urine pale yellow. °Take over-the-counter and prescription medicines only as told by your health care provider. °Keep all follow-up visits. This is important. °Contact a health care provider if: °Your symptoms get worse. °You have new symptoms. °You feel overwhelmed by your problems and can no longer manage them by yourself. °Get help right away if: °You have thoughts of hurting yourself or others. °Get help right awayif you feel like you may hurt yourself or others, or have thoughts about taking your own life. Go to your nearest emergency room or: °Call 911. °Call the National Suicide Prevention Lifeline at 1-800-273-8255 or  988. This is open 24 hours a day. °Text the Crisis Text Line at 741741. °Summary °Stress is a normal reaction to life events. It can cause problems if it happens too often or for too long. °Practicing stress management techniques is the best way to treat stress. °Counseling or talk therapy with a mental health provider may help if you are having trouble managing stress by yourself. °This information is not intended to replace advice given to you by your health care provider. Make sure you discuss any questions you have with your health care provider. °Document Revised: 08/16/2020 Document Reviewed: 08/16/2020 °Elsevier Patient Education © 2022 [employer] ° °

## 2021-05-02 ENCOUNTER — Encounter: Payer: Self-pay | Admitting: Nurse Practitioner

## 2021-05-02 ENCOUNTER — Ambulatory Visit (INDEPENDENT_AMBULATORY_CARE_PROVIDER_SITE_OTHER): Payer: 59 | Admitting: Nurse Practitioner

## 2021-05-02 ENCOUNTER — Other Ambulatory Visit (HOSPITAL_COMMUNITY)
Admission: RE | Admit: 2021-05-02 | Discharge: 2021-05-02 | Disposition: A | Discharge status: Home / Self Care | Payer: 59 | Source: Ambulatory Visit | Attending: Nurse Practitioner | Admitting: Nurse Practitioner

## 2021-05-02 VITALS — BP 122/76 | Ht 60.0 in | Wt 146.0 lb

## 2021-05-02 DIAGNOSIS — N951 Menopausal and female climacteric states: Secondary | ICD-10-CM | POA: Diagnosis not present

## 2021-05-02 DIAGNOSIS — Z01419 Encounter for gynecological examination (general) (routine) without abnormal findings: Secondary | ICD-10-CM | POA: Insufficient documentation

## 2021-05-02 DIAGNOSIS — F418 Other specified anxiety disorders: Secondary | ICD-10-CM | POA: Diagnosis not present

## 2021-05-02 MED ORDER — ESCITALOPRAM OXALATE 10 MG PO TABS: 10.0000 mg | ORAL_TABLET | Freq: Every day | ORAL | 0 refills | Status: DC

## 2021-05-02 NOTE — Progress Notes (Signed)
? ?Leslie Ritter 1970-09-20 [id number] ? ? ?History:  51 y.o. G1P1 presents for annual exam. Perimenopausal. Started on HRT 09/2020 for mood changes, hot flashes, and night sweats. She has had situational anxiety. She stopped HRT and did not notice any difference without it. She takes Lexapro 20 mg daily for GAD. 1992 cryo, 2009 LGSIL, 2017/2018 CIN-1, 2019 ASCUS neg HPV, normal 2020/2021/2022. Normal mammogram history. HTN and anxiety managed by PCP.  ? ?Gynecologic History ?Patient's last menstrual period was 03/22/2021 (approximate). ?Period Duration (Days):  (Prolonged spotting) ?Period Pattern: (!) Irregular ?Menstrual Flow: Light ?Dysmenorrhea: None ?Contraception/Family planning: tubal ligation ?Sexually active: Yes ? ?Health Maintenance ?Last Pap: 04/19/2020. Results were: Normal, 3-year repeat ?Last mammogram: 07/12/2020 Results were: Normal ?Last colonoscopy: 08/31/2020. Results were: Normal, 10-year recall ?Last Dexa: N/A ? ?Past medical history, past surgical history, family history and social history were all reviewed and documented in the EPIC chart. Married. 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 92 yo son. 9 mo granddaughter. ? ?ROS:  A ROS was performed and pertinent positives and negatives are included. ? ?Exam: ? ?Vitals:  ? 05/02/21 1516  ?BP: 122/76  ?Weight: 146 lb (66.2 kg)  ?Height: 5' (1.524 m)  ? ? ?Body mass index is 28.51 kg/m?. ? ?General appearance:  Normal ?Thyroid:  Symmetrical, normal in size, without palpable masses or nodularity. ?Respiratory ? Auscultation:  Clear without wheezing or rhonchi ?Cardiovascular ? Auscultation:  Regular rate, without rubs, murmurs or gallops ? Edema/varicosities:  Not grossly evident ?Abdominal ? Soft,nontender, without masses, guarding or rebound. ? Liver/spleen:  No organomegaly noted ? Hernia:  None appreciated ? Skin ? Inspection:  Grossly normal ?  ?Breasts: Examined lying and sitting.  ? Right: Without masses, retractions, discharge or axillary  adenopathy. ? ? Left: Without masses, retractions, discharge or axillary adenopathy. ?Genitourinary  ? Inguinal/mons:  Normal without inguinal adenopathy ? External genitalia:  Normal appearing vulva with no masses, tenderness, or lesions ? BUS/Urethra/Skene's glands:  Normal ? Vagina:  Normal appearing with normal color and discharge, no lesions ? Cervix:  Normal appearing without discharge or lesions ? Uterus:  Normal in size, shape and contour.  Midline and mobile, nontender ? Adnexa/parametria:   ?  Rt: Normal in size, without masses or tenderness. ?  Lt: Normal in size, without masses or tenderness. ? Anus and perineum: Normal ? Digital rectal exam: Normal sphincter tone without palpated masses or tenderness ? ?Patient informed chaperone available to be present for breast and pelvic exam. Patient has requested no chaperone to be present. Patient has been advised what will be completed during breast and pelvic exam.  ? ?Assessment/Plan:  51 y.o. G1P1 for annual exam.  ? ?Well female exam with routine gynecological exam - Plan: Cytology - PAP( [facility]). Education provided on SBEs, importance of preventative screenings, current guidelines, high calcium diet, regular exercise, and multivitamin daily.  Labs with PCP.  ? ?Situational anxiety - Plan: escitalopram (LEXAPRO) 10 MG tablet daily with 20 mg tablet for total of 30 mg daily. She has increased situational anxiety. She is tearful during visit. She has had hair loss due to stress. Recommend increasing Lexapro to 30 mg daily. If no improvement she will discuss alternative with PCP. Takes Klonopin up to twice daily. She saw therapy a few times but did not feel she benefited from sessions. Recommend finding someone else.  ? ?Perimenopausal - Having irregular menses. No longer having menopausal symptoms. Stopped HRT and doing fine.  ? ?Screening for cervical cancer - 1992 cryo, 2009  LGSIL, 2017/2018 CIN-1, 2019 ASCUS neg HPV, normal 2020/2021/2022. We discussed  current guidelines. She requests annual paps. Pap with reflex today.  ? ?Screening for breast cancer - Normal mammogram history.  Continue annual screenings.  Normal breast exam today. ? ?Screening for colon cancer - Colonoscopy 08/2020. Will repeat at 10-year interval per GI recommendation.  ? ?Screening for osteoporosis - Average risk. Will plan DXA at age 70.  ? ?Return in 1 year for annual. ? ? ? ? ?Royal, 3:41 PM 05/02/2021 ? ?

## 2021-05-07 ENCOUNTER — Other Ambulatory Visit: Payer: Self-pay | Admitting: Nurse Practitioner

## 2021-05-07 DIAGNOSIS — F411 Generalized anxiety disorder: Secondary | ICD-10-CM

## 2021-05-07 DIAGNOSIS — I10 Essential (primary) hypertension: Secondary | ICD-10-CM

## 2021-05-07 LAB — CYTOLOGY - PAP: Diagnosis: NEGATIVE

## 2021-06-07 ENCOUNTER — Other Ambulatory Visit: Payer: Self-pay | Admitting: Nurse Practitioner

## 2021-06-07 DIAGNOSIS — Z79890 Hormone replacement therapy (postmenopausal): Secondary | ICD-10-CM

## 2021-07-02 ENCOUNTER — Other Ambulatory Visit: Payer: Self-pay | Admitting: Nurse Practitioner

## 2021-07-02 DIAGNOSIS — Z79890 Hormone replacement therapy (postmenopausal): Secondary | ICD-10-CM

## 2021-07-02 DIAGNOSIS — Z1231 Encounter for screening mammogram for malignant neoplasm of breast: Secondary | ICD-10-CM

## 2021-08-01 ENCOUNTER — Ambulatory Visit
Admission: RE | Admit: 2021-08-01 | Discharge: 2021-08-01 | Disposition: A | Discharge status: Home / Self Care | Payer: 59 | Source: Ambulatory Visit | Attending: Nurse Practitioner | Admitting: Nurse Practitioner

## 2021-08-01 DIAGNOSIS — Z1231 Encounter for screening mammogram for malignant neoplasm of breast: Secondary | ICD-10-CM

## 2021-08-08 ENCOUNTER — Other Ambulatory Visit: Payer: Self-pay | Admitting: Nurse Practitioner

## 2021-08-08 DIAGNOSIS — F418 Other specified anxiety disorders: Secondary | ICD-10-CM

## 2021-08-09 NOTE — Telephone Encounter (Signed)
Leslie Ritter patient  Annual exam was 04/2021

## 2021-10-10 ENCOUNTER — Encounter: Payer: Self-pay | Admitting: Nurse Practitioner

## 2021-10-10 ENCOUNTER — Ambulatory Visit: Payer: 59 | Admitting: Nurse Practitioner

## 2021-10-10 VITALS — BP 112/80 | HR 66 | Temp 97.5°F | Resp 20 | Ht 60.0 in | Wt 141.0 lb

## 2021-10-10 DIAGNOSIS — F411 Generalized anxiety disorder: Secondary | ICD-10-CM | POA: Diagnosis not present

## 2021-10-10 DIAGNOSIS — F3342 Major depressive disorder, recurrent, in full remission: Secondary | ICD-10-CM | POA: Diagnosis not present

## 2021-10-10 DIAGNOSIS — I10 Essential (primary) hypertension: Secondary | ICD-10-CM

## 2021-10-10 DIAGNOSIS — Z23 Encounter for immunization: Secondary | ICD-10-CM

## 2021-10-10 DIAGNOSIS — Z6826 Body mass index (BMI) 26.0-26.9, adult: Secondary | ICD-10-CM | POA: Diagnosis not present

## 2021-10-10 DIAGNOSIS — L659 Nonscarring hair loss, unspecified: Secondary | ICD-10-CM

## 2021-10-10 MED ORDER — LISINOPRIL-HYDROCHLOROTHIAZIDE 20-12.5 MG PO TABS: 1.0000 | ORAL_TABLET | Freq: Every day | ORAL | 1 refills | Status: DC

## 2021-10-10 MED ORDER — ESCITALOPRAM OXALATE 20 MG PO TABS: 20.0000 mg | ORAL_TABLET | Freq: Every day | ORAL | 1 refills | Status: DC

## 2021-10-10 MED ORDER — CLONAZEPAM 0.5 MG PO TABS
0.5000 mg | ORAL_TABLET | Freq: Two times a day (BID) | ORAL | 5 refills | Status: DC | PRN
Start: 2021-10-10 — End: 2022-04-10

## 2021-10-10 NOTE — Progress Notes (Signed)
Subjective:    Patient ID: Leslie Ritter, female    DOB: Apr 11, 1970, 51 y.o.   MRN: [id number]   Chief Complaint: No chief complaint on file.    HPI:  Leslie Ritter is a 51 y.o. who identifies as a female who was assigned female at birth.   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 Primary hypertension No c/o chest pain, sob or headache. Does not check blood pressure at home BP Readings from Last 3 Encounters:  05/02/21 122/76  04/11/21 101/71  10/12/20 120/82     2. Recurrent major depressive disorder, in full remission ([place]) Has been o n lexapro for some time. Seems to be working well. She has a lot of family issues going on.    10/10/2021    4:15 PM 04/11/2021    2:35 PM 10/12/2020   12:19 PM  Depression screen PHQ 2/9  Decreased Interest 0 2 1  Down, Depressed, Hopeless _0 PHQ - 2 Score _1 Altered sleeping _2 Tired, decreased energy _3 Change in appetite 0 2 0  Feeling bad or failure about yourself  _4 Trouble concentrating _5 Moving slowly or fidgety/restless 1 1 0  Suicidal thoughts 0 0 0  PHQ-9 Score _6 Difficult doing work/chores Somewhat difficult Somewhat difficult Somewhat difficult    3. GAD (generalized anxiety disorder) Has anxiety due to family issues. She is on klonopin BID.    10/10/2021    4:17 PM 04/11/2021    2:35 PM 10/12/2020   12:19 PM 04/11/2020    3:53 PM  GAD 7 : Generalized Anxiety Score  Nervous, Anxious, on Edge _7 Control/stop worrying _8 Worry too much - different things _9 Trouble relaxing _10 0  Restless _11 Easily annoyed or irritable _12 Afraid - awful might happen _13 0  Total GAD 7 Score _14 Anxiety Difficulty Somewhat difficult Somewhat difficult Somewhat difficult Not difficult at all      4. BMI  26.0-26.9,adult Weight is down 5 lbs Wt Readings from Last 3 Encounters:  10/10/21 141 lb (64 kg)  05/02/21 146 lb (66.2 kg)  04/11/21 144 lb (65.3 kg)   BMI Readings from Last 3 Encounters:  10/10/21 27.54 kg/m  05/02/21 28.51 kg/m  04/11/21 27.21 kg/m     New complaints: None today  No Known Allergies Outpatient Encounter Medications as of 10/10/2021  Medication Sig   Ascorbic Acid (VITA-C PO) Take by mouth.   Biotin w/ Vitamins C & E (HAIR/SKIN/NAILS PO) Take by mouth.   clonazePAM (KLONOPIN) 0.5 MG tablet Take 1 tablet (0.5 mg total) by mouth 2 (two) times daily as needed for anxiety.   escitalopram (LEXAPRO) 10 MG tablet TAKE 1 TABLET BY MOUTH ONCE DAILY TAKE  WITH  20MG  TABLET  FOR  TOTAL  OF  30MG  DAILY   escitalopram (LEXAPRO) 20 MG tablet Take 1 tablet (20 mg total) by mouth daily.   lisinopril-hydrochlorothiazide (ZESTORETIC) 20-12.5 MG tablet Take 1 tablet by mouth daily.   Multiple Vitamins-Minerals (MULTIVITAL PO) Take by mouth.   valACYclovir (VALTREX) 500 MG tablet Take twice daily  for 3 to 5 days as needed   No facility-administered encounter medications on file as of 10/10/2021.    Past Surgical History:  Procedure Laterality Date   Arm surg-Radial nerve     GYNECOLOGIC CRYOSURGERY  1992   LAPAROSCOPIC TUBAL LIGATION Bilateral 03/30/2012   Procedure: LAPAROSCOPIC TUBAL LIGATION;  Surgeon: Anastasio Auerbach, MD;  Location: [facility];  Service: Gynecology;  Laterality: Bilateral;   mole removed     face - left cheek   TENDON RECONSTRUCTION Right    WISDOM TOOTH EXTRACTION      Family History  Problem Relation Age of Onset   Hypertension Mother    Hypertension Sister    Diabetes Maternal Grandmother    Hypertension Maternal Grandfather    Heart disease Maternal Grandfather    Colon cancer Neg Hx    Colon polyps Neg Hx    Esophageal cancer Neg Hx    Stomach cancer Neg Hx    Rectal cancer Neg Hx       Controlled substance contract:  n/a     Review of Systems  Constitutional:  Negative for diaphoresis.  Eyes:  Negative for pain.  Respiratory:  Negative for shortness of breath.   Cardiovascular:  Negative for chest pain, palpitations and leg swelling.  Gastrointestinal:  Negative for abdominal pain.  Endocrine: Negative for polydipsia.  Skin:  Negative for rash.  Neurological:  Negative for dizziness, weakness and headaches.  Hematological:  Does not bruise/bleed easily.  All other systems reviewed and are negative.      Objective:   Physical Exam Vitals and nursing note reviewed.  Constitutional:      General: She is not in acute distress.    Appearance: Normal appearance. She is well-developed.  HENT:     Head: Normocephalic.     Right Ear: Tympanic membrane normal.     Left Ear: Tympanic membrane normal.     Nose: Nose normal.     Mouth/Throat:     Mouth: Mucous membranes are moist.  Eyes:     Pupils: Pupils are equal, round, and reactive to light.  Neck:     Vascular: No carotid bruit or JVD.  Cardiovascular:     Rate and Rhythm: Normal rate and regular rhythm.     Heart sounds: Normal heart sounds.  Pulmonary:     Effort: Pulmonary effort is normal. No respiratory distress.     Breath sounds: Normal breath sounds. No wheezing or rales.  Chest:     Chest wall: No tenderness.  Abdominal:     General: Bowel sounds are normal. There is no distension or abdominal bruit.     Palpations: Abdomen is soft. There is no hepatomegaly, splenomegaly, mass or pulsatile mass.     Tenderness: There is no abdominal tenderness.  Musculoskeletal:        General: Normal range of motion.     Cervical back: Normal range of motion and neck supple.  Lymphadenopathy:     Cervical: No cervical adenopathy.  Skin:    General: Skin is warm and dry.  Neurological:     Mental Status: She is alert and oriented to person, place, and time.     Deep Tendon Reflexes: Reflexes are normal and symmetric.  Psychiatric:         Behavior: Behavior normal.        Thought Content: Thought content normal.        Judgment: Judgment normal.    BP 112/80   Pulse 66  Temp (!) 97.5 F (36.4 C) (Temporal)   Resp 20   Ht 5' (1.524 m)   Wt 141 lb (64 kg)   SpO2 97%   BMI 27.54 kg/m         Assessment & Plan:   Trish Mage Demelo comes in today with chief complaint of Medical Management of Chronic Issues   Diagnosis and orders addressed:  1. Primary hypertension Low sodium diet - lisinopril-hydrochlorothiazide (ZESTORETIC) 20-12.5 MG tablet; Take 1 tablet by mouth daily.  Dispense: 90 tablet; Refill: 1 - CBC with Differential/Platelet - CMP14+EGFR - Lipid panel  2. Recurrent major depressive disorder, in full remission ([place]) Stress managememt  3. GAD (generalized anxiety disorder) - escitalopram (LEXAPRO) 20 MG tablet; Take 1 tablet (20 mg total) by mouth daily.  Dispense: 90 tablet; Refill: 1 - clonazePAM (KLONOPIN) 0.5 MG tablet; Take 1 tablet (0.5 mg total) by mouth 2 (two) times daily as needed for anxiety.  Dispense: 60 tablet; Refill: 5  4. BMI 26.0-26.9,adult Discussed diet and exercise for person with BMI >25 Will recheck weight in 3-6 months   5. Hair loss Labs pending - VITAMIN D 25 Hydroxy (Vit-D Deficiency, Fractures)   Labs pending Health Maintenance reviewed Diet and exercise encouraged  Follow up plan: 6 months   Mary-Margaret Hassell Done, FNP

## 2021-10-10 NOTE — Patient Instructions (Signed)
Vitamin D Deficiency Vitamin D deficiency is when your body does not have enough vitamin D. Vitamin D is important because: It helps your body use certain minerals. It helps to keep your bones healthy. It lessens irritation and swelling (inflammation). It helps the body's defense system (immune system) work better. Not getting enough vitamin D can make your bones soft. What are the causes? Not eating enough foods that have vitamin D in them. Not getting enough sun. Having diseases that make it hard for your body to take in vitamin D. Having had part of your stomach or part of your small intestine taken out. What increases the risk? Being an older adult. Not spending much time outdoors. Living in a long-term care center. Having dark skin. Taking certain medicines. Being overweight or very overweight (obese). Having long-term (chronic) kidney or liver disease. What are the signs or symptoms? In mild cases, there may be no symptoms. If the condition is very bad, symptoms may include: Bone pain. Muscle pain. Not being able to walk normally. Bones that break easily. Joint pain. How is this treated? Treatment may include taking supplements as told by your doctor. Your doctor will tell you what dose is best for you. This may include taking: Vitamin D. Calcium. Follow these instructions at home: Eating and drinking Eat foods that have vitamin D in them, such as: Dairy products, cereals, or juices that have vitamin D added to them (are fortified). Check the label. Fish, such as salmon or trout. Eggs. The vitamin D is in the yolk. Mushrooms that were treated with UV light. Beef liver. The items listed above may not be a complete list of foods and beverages you can eat and drink. Contact a dietitian for more information. General instructions Take over-the-counter and prescription medicines only as told by your doctor. Take supplements only as told by your doctor. Get sunlight in a  safe way. Do not use a tanning bed. Stay at a healthy weight. Lose weight if you need to. Keep all follow-up visits. How is this prevented? Eating foods that naturally have vitamin D in them. Eating or drinking foods and drinks that have vitamin D added to them, such as cereals, juices, and milk. Taking vitamin D or a multivitamin that has vitamin D in it. Being in the sun. Your body makes vitamin D when your skin gets sunlight. Contact a doctor if: Your symptoms do not go away. You feel like you may vomit (nauseous). You vomit. You poop less often than normal, or you have trouble pooping (constipation). Summary Vitamin D deficiency is when your body does not have enough vitamin D. Vitamin D helps to keep your bones healthy. This condition is often treated by taking a supplement. Your doctor will tell you what dose is best for you. This information is not intended to replace advice given to you by your health care provider. Make sure you discuss any questions you have with your health care provider. Document Revised: 10/12/2020 Document Reviewed: 10/12/2020 Elsevier Patient Education  2023 Elsevier Inc.  

## 2021-10-11 LAB — CMP14+EGFR
ALT: 36 IU/L — ABNORMAL HIGH (ref 0–32)
AST: 27 IU/L (ref 0–40)
Albumin/Globulin Ratio: 2.4 — ABNORMAL HIGH (ref 1.2–2.2)
Albumin: 5 g/dL — ABNORMAL HIGH (ref 3.8–4.9)
Alkaline Phosphatase: 56 IU/L (ref 44–121)
BUN/Creatinine Ratio: 22 (ref 9–23)
BUN: 15 mg/dL (ref 6–24)
Bilirubin Total: 0.5 mg/dL (ref 0.0–1.2)
CO2: 26 mmol/L (ref 20–29)
Calcium: 10.2 mg/dL (ref 8.7–10.2)
Chloride: 97 mmol/L (ref 96–106)
Creatinine, Ser: 0.68 mg/dL (ref 0.57–1.00)
Globulin, Total: 2.1 g/dL (ref 1.5–4.5)
Glucose: 101 mg/dL — ABNORMAL HIGH (ref 70–99)
Potassium: 4.6 mmol/L (ref 3.5–5.2)
Sodium: 142 mmol/L (ref 134–144)
Total Protein: 7.1 g/dL (ref 6.0–8.5)
eGFR: 105 mL/min/{1.73_m2} (ref >59)

## 2021-10-11 LAB — THYROID PANEL WITH TSH
Free Thyroxine Index: 1.4 (ref 1.2–4.9)
T3 Uptake Ratio: 27 % (ref 24–39)
T4, Total: 5.3 ug/dL (ref 4.5–12.0)
TSH: 1.17 u[IU]/mL (ref 0.450–4.500)

## 2021-10-11 LAB — LIPID PANEL
Chol/HDL Ratio: 1.8 ratio (ref 0.0–4.4)
Cholesterol, Total: 173 mg/dL (ref 100–199)
HDL: 94 mg/dL (ref >39)
LDL Chol Calc (NIH): 63 mg/dL (ref 0–99)
Triglycerides: 91 mg/dL (ref 0–149)
VLDL Cholesterol Cal: 16 mg/dL (ref 5–40)

## 2021-10-11 LAB — CBC WITH DIFFERENTIAL/PLATELET
Basophils Absolute: 0 10*3/uL (ref 0.0–0.2)
Basos: 0 %
EOS (ABSOLUTE): 0.1 10*3/uL (ref 0.0–0.4)
Eos: 1 %
Hematocrit: 41.5 % (ref 34.0–46.6)
Hemoglobin: 13.8 g/dL (ref 11.1–15.9)
Immature Grans (Abs): 0 10*3/uL (ref 0.0–0.1)
Immature Granulocytes: 0 %
Lymphocytes Absolute: 2.2 10*3/uL (ref 0.7–3.1)
Lymphs: 28 %
MCH: 31.2 pg (ref 26.6–33.0)
MCHC: 33.3 g/dL (ref 31.5–35.7)
MCV: 94 fL (ref 79–97)
Monocytes Absolute: 0.8 10*3/uL (ref 0.1–0.9)
Monocytes: 10 %
Neutrophils Absolute: 4.8 10*3/uL (ref 1.4–7.0)
Neutrophils: 61 %
Platelets: 322 10*3/uL (ref 150–450)
RBC: 4.42 x10E6/uL (ref 3.77–5.28)
RDW: 12 % (ref 11.7–15.4)
WBC: 7.8 10*3/uL (ref 3.4–10.8)

## 2021-10-11 LAB — VITAMIN D 25 HYDROXY (VIT D DEFICIENCY, FRACTURES): Vit D, 25-Hydroxy: 83.3 ng/mL (ref 30.0–100.0)

## 2021-10-14 ENCOUNTER — Ambulatory Visit: Payer: 59 | Admitting: Nurse Practitioner

## 2021-11-05 ENCOUNTER — Other Ambulatory Visit: Payer: Self-pay | Admitting: Radiology

## 2021-11-05 DIAGNOSIS — F418 Other specified anxiety disorders: Secondary | ICD-10-CM

## 2021-11-05 NOTE — Telephone Encounter (Signed)
Patient called. States she has the 20 mg Lexapro from her PCP.  She needs refill on 10 mg.

## 2021-11-05 NOTE — Telephone Encounter (Signed)
Leslie Ritter it appears patient PCP prescribed last in 10/10/21 #90 with 1 refill.

## 2022-01-20 DIAGNOSIS — Z91014 Allergy to mammalian meats: Secondary | ICD-10-CM

## 2022-01-20 HISTORY — DX: Allergy to mammalian meats: Z91.014

## 2022-04-10 ENCOUNTER — Encounter: Payer: Self-pay | Admitting: Nurse Practitioner

## 2022-04-10 ENCOUNTER — Ambulatory Visit: Payer: 59 | Admitting: Nurse Practitioner

## 2022-04-10 ENCOUNTER — Other Ambulatory Visit: Payer: Self-pay | Admitting: Nurse Practitioner

## 2022-04-10 VITALS — BP 126/89 | HR 74 | Temp 98.0°F | Resp 20 | Ht 60.0 in | Wt 144.0 lb

## 2022-04-10 DIAGNOSIS — Z23 Encounter for immunization: Secondary | ICD-10-CM | POA: Diagnosis not present

## 2022-04-10 DIAGNOSIS — Z6826 Body mass index (BMI) 26.0-26.9, adult: Secondary | ICD-10-CM | POA: Diagnosis not present

## 2022-04-10 DIAGNOSIS — F418 Other specified anxiety disorders: Secondary | ICD-10-CM

## 2022-04-10 DIAGNOSIS — F411 Generalized anxiety disorder: Secondary | ICD-10-CM | POA: Diagnosis not present

## 2022-04-10 DIAGNOSIS — I10 Essential (primary) hypertension: Secondary | ICD-10-CM

## 2022-04-10 DIAGNOSIS — F3342 Major depressive disorder, recurrent, in full remission: Secondary | ICD-10-CM

## 2022-04-10 DIAGNOSIS — Z1231 Encounter for screening mammogram for malignant neoplasm of breast: Secondary | ICD-10-CM

## 2022-04-10 DIAGNOSIS — B009 Herpesviral infection, unspecified: Secondary | ICD-10-CM

## 2022-04-10 MED ORDER — CLONAZEPAM 0.5 MG PO TABS: 0.5000 mg | ORAL_TABLET | Freq: Two times a day (BID) | ORAL | 5 refills | Status: DC | PRN

## 2022-04-10 MED ORDER — ESCITALOPRAM OXALATE 10 MG PO TABS: ORAL_TABLET | ORAL | 1 refills | Status: DC

## 2022-04-10 MED ORDER — LISINOPRIL-HYDROCHLOROTHIAZIDE 20-12.5 MG PO TABS: 1.0000 | ORAL_TABLET | Freq: Every day | ORAL | 1 refills | Status: DC

## 2022-04-10 MED ORDER — VALACYCLOVIR HCL 500 MG PO TABS: ORAL_TABLET | ORAL | 12 refills | Status: DC

## 2022-04-10 MED ORDER — ESCITALOPRAM OXALATE 20 MG PO TABS: 20.0000 mg | ORAL_TABLET | Freq: Every day | ORAL | 1 refills | Status: DC

## 2022-04-10 NOTE — Progress Notes (Signed)
Subjective:    Patient ID: Leslie Ritter, female    DOB: [birth year], 52 y.o.   MRN: [id number]   Chief Complaint: Medical Management of Chronic Issues    HPI:  Leslie Ritter is a 52 y.o. who identifies as a female who was assigned female at birth.   Social history: Lives with: husband Work history: Environmental consultant at [employer]   Comes in today for follow up of the following chronic medical issues:  1. Primary hypertension No c/o chest pain, sob or headache. Doe snot check blood pressure very often outside of office. BP Readings from Last 3 Encounters:  04/10/22 126/89  10/10/21 112/80  05/02/21 122/76     2. Recurrent major depressive disorder, in full remission ([place]) Lexapro dialy and is doing well.    04/10/2022    4:03 PM 10/10/2021    4:15 PM 04/11/2021    2:35 PM  Depression screen PHQ 2/9  Decreased Interest 1 0 2  Down, Depressed, Hopeless 1 1 2   PHQ - 2 Score 2 1 4   Altered sleeping 1 2 2   Tired, decreased energy 2 2 2   Change in appetite 0 0 2  Feeling bad or failure about yourself  1 2 3   Trouble concentrating 0 1 2  Moving slowly or fidgety/restless 0 1 1  Suicidal thoughts 0 0 0  PHQ-9 Score 6 9 16   Difficult doing work/chores Somewhat difficult Somewhat difficult Somewhat difficult     3. GAD (generalized anxiety disorder)    10/10/2021    4:17 PM 04/11/2021    2:35 PM 10/12/2020   12:19 PM 04/11/2020    3:53 PM  GAD 7 : Generalized Anxiety Score  Nervous, Anxious, on Edge 2 3 2 1   Control/stop worrying 2 3 1 1   Worry too much - different things 3 3 1 1   Trouble relaxing 2 3 2  0  Restless 1 2 1 1   Easily annoyed or irritable 2 3 2 1   Afraid - awful might happen 2 2 1  0  Total GAD 7 Score 14 19 10 5   Anxiety Difficulty Somewhat difficult Somewhat difficult Somewhat difficult Not difficult at all      4. BMI 26.0-26.9,adult No recent weight changes Wt Readings from Last 3 Encounters:  04/10/22 144 lb (65.3 kg)   10/10/21 141 lb (64 kg)  05/02/21 146 lb (66.2 kg)   BMI Readings from Last 3 Encounters:  04/10/22 28.12 kg/m  10/10/21 27.54 kg/m  05/02/21 28.51 kg/m      New complaints: None today  No Known Allergies Outpatient Encounter Medications as of 04/10/2022  Medication Sig   Ascorbic Acid (VITA-C PO) Take by mouth.   Biotin w/ Vitamins C & E (HAIR/SKIN/NAILS PO) Take by mouth.   clonazePAM (KLONOPIN) 0.5 MG tablet Take 1 tablet (0.5 mg total) by mouth 2 (two) times daily as needed for anxiety.   escitalopram (LEXAPRO) 10 MG tablet TAKE 1 TABLET BY MOUTH ONCE DAILY TAKE  WITH  20  MG  TABLET  FOR  TOTAL  OF  30  MG  DAILY   escitalopram (LEXAPRO) 20 MG tablet Take 1 tablet (20 mg total) by mouth daily.   lisinopril-hydrochlorothiazide (ZESTORETIC) 20-12.5 MG tablet Take 1 tablet by mouth daily.   Multiple Vitamins-Minerals (MULTIVITAL PO) Take by mouth.   valACYclovir (VALTREX) 500 MG tablet Take twice daily for 3 to 5 days as needed   No facility-administered encounter medications on file as of 04/10/2022.  Past Surgical History:  Procedure Laterality Date   Arm surg-Radial nerve     GYNECOLOGIC CRYOSURGERY  1992   LAPAROSCOPIC TUBAL LIGATION Bilateral 03/30/2012   Procedure: LAPAROSCOPIC TUBAL LIGATION;  Surgeon: Anastasio Auerbach, MD;  Location: [facility];  Service: Gynecology;  Laterality: Bilateral;   mole removed     face - left cheek   TENDON RECONSTRUCTION Right    WISDOM TOOTH EXTRACTION      Family History  Problem Relation Age of Onset   Hypertension Mother    Hypertension Sister    Diabetes Maternal Grandmother    Hypertension Maternal Grandfather    Heart disease Maternal Grandfather    Colon cancer Neg Hx    Colon polyps Neg Hx    Esophageal cancer Neg Hx    Stomach cancer Neg Hx    Rectal cancer Neg Hx       Controlled substance contract: n/a     Review of Systems  Constitutional:  Negative for diaphoresis.  Eyes:  Negative for pain.   Respiratory:  Negative for shortness of breath.   Cardiovascular:  Negative for chest pain, palpitations and leg swelling.  Gastrointestinal:  Negative for abdominal pain.  Endocrine: Negative for polydipsia.  Skin:  Negative for rash.  Neurological:  Negative for dizziness, weakness and headaches.  Hematological:  Does not bruise/bleed easily.  All other systems reviewed and are negative.      Objective:   Physical Exam Vitals and nursing note reviewed.  Constitutional:      General: She is not in acute distress.    Appearance: Normal appearance. She is well-developed.  HENT:     Head: Normocephalic.     Right Ear: Tympanic membrane normal.     Left Ear: Tympanic membrane normal.     Nose: Nose normal.     Mouth/Throat:     Mouth: Mucous membranes are moist.  Eyes:     Pupils: Pupils are equal, round, and reactive to light.  Neck:     Vascular: No carotid bruit or JVD.  Cardiovascular:     Rate and Rhythm: Normal rate and regular rhythm.     Heart sounds: Normal heart sounds.  Pulmonary:     Effort: Pulmonary effort is normal. No respiratory distress.     Breath sounds: Normal breath sounds. No wheezing or rales.  Chest:     Chest wall: No tenderness.  Abdominal:     General: Bowel sounds are normal. There is no distension or abdominal bruit.     Palpations: Abdomen is soft. There is no hepatomegaly, splenomegaly, mass or pulsatile mass.     Tenderness: There is no abdominal tenderness.  Musculoskeletal:        General: Normal range of motion.     Cervical back: Normal range of motion and neck supple.  Lymphadenopathy:     Cervical: No cervical adenopathy.  Skin:    General: Skin is warm and dry.  Neurological:     Mental Status: She is alert and oriented to person, place, and time.     Deep Tendon Reflexes: Reflexes are normal and symmetric.  Psychiatric:        Behavior: Behavior normal.        Thought Content: Thought content normal.        Judgment:  Judgment normal.     BP 126/89   Pulse 74   Temp 98 F (36.7 C) (Temporal)   Resp 20   Ht 5' (1.524 m)  Wt 144 lb (65.3 kg)   SpO2 99%   BMI 28.12 kg/m        Assessment & Plan:  [name] in today with chief complaint of Medical Management of Chronic Issues   1. Primary hypertension Low sodium diet - lisinopril-hydrochlorothiazide (ZESTORETIC) 20-12.5 MG tablet; Take 1 tablet by mouth daily.  Dispense: 90 tablet; Refill: 1 - CBC with Differential/Platelet - CMP14+EGFR - Lipid panel  2. Recurrent major depressive disorder, in full remission [facility]) Stress management  3. GAD (generalized anxiety disorder) - clonazePAM (KLONOPIN) 0.5 MG tablet; Take 1 tablet (0.5 mg total) by mouth 2 (two) times daily as needed for anxiety.  Dispense: 60 tablet; Refill: 5 - escitalopram (LEXAPRO) 20 MG tablet; Take 1 tablet (20 mg total) by mouth daily.  Dispense: 90 tablet; Refill: 1  4. BMI 26.0-26.9,adult Discussed diet and exercise for person with BMI >25 Will recheck weight in 3-6 months   5. HSV infection - valACYclovir (VALTREX) 500 MG tablet; Take twice daily for 3 to 5 days as needed  Dispense: 30 tablet; Refill: 12  6. Situational anxiety - escitalopram (LEXAPRO) 10 MG tablet; TAKE 1 TABLET BY MOUTH ONCE DAILY TAKE  WITH  20  MG  TABLET  FOR  TOTAL  OF  30  MG  DAILY  Dispense: 90 tablet; Refill: 1    The above assessment and management plan was discussed with the patient. The patient verbalized understanding of and has agreed to the management plan. Patient is aware to call the clinic if symptoms persist or worsen. Patient is aware when to return to the clinic for a follow-up visit. Patient educated on when it is appropriate to go to the emergency department.   Mary-Margaret Hassell Done, FNP

## 2022-04-10 NOTE — Addendum Note (Signed)
Addended by: Chevis Pretty on: 04/10/2022 04:49 PM   Modules accepted: Level of Service

## 2022-04-11 LAB — CBC WITH DIFFERENTIAL/PLATELET
Basophils Absolute: 0 10*3/uL (ref 0.0–0.2)
Basos: 0 %
EOS (ABSOLUTE): 0.1 10*3/uL (ref 0.0–0.4)
Eos: 1 %
Hematocrit: 42.2 % (ref 34.0–46.6)
Hemoglobin: 14.5 g/dL (ref 11.1–15.9)
Immature Grans (Abs): 0 10*3/uL (ref 0.0–0.1)
Immature Granulocytes: 0 %
Lymphocytes Absolute: 2.2 10*3/uL (ref 0.7–3.1)
Lymphs: 28 %
MCH: 32 pg (ref 26.6–33.0)
MCHC: 34.4 g/dL (ref 31.5–35.7)
MCV: 93 fL (ref 79–97)
Monocytes Absolute: 0.8 10*3/uL (ref 0.1–0.9)
Monocytes: 10 %
Neutrophils Absolute: 4.7 10*3/uL (ref 1.4–7.0)
Neutrophils: 61 %
Platelets: 315 10*3/uL (ref 150–450)
RBC: 4.53 x10E6/uL (ref 3.77–5.28)
RDW: 11.9 % (ref 11.7–15.4)
WBC: 7.8 10*3/uL (ref 3.4–10.8)

## 2022-04-11 LAB — CMP14+EGFR
ALT: 32 IU/L (ref 0–32)
AST: 23 IU/L (ref 0–40)
Albumin/Globulin Ratio: 1.9 (ref 1.2–2.2)
Albumin: 5 g/dL — ABNORMAL HIGH (ref 3.8–4.9)
Alkaline Phosphatase: 65 IU/L (ref 44–121)
BUN/Creatinine Ratio: 22 (ref 9–23)
BUN: 18 mg/dL (ref 6–24)
Bilirubin Total: 0.7 mg/dL (ref 0.0–1.2)
CO2: 27 mmol/L (ref 20–29)
Calcium: 10 mg/dL (ref 8.7–10.2)
Chloride: 99 mmol/L (ref 96–106)
Creatinine, Ser: 0.81 mg/dL (ref 0.57–1.00)
Globulin, Total: 2.7 g/dL (ref 1.5–4.5)
Glucose: 99 mg/dL (ref 70–99)
Potassium: 4.5 mmol/L (ref 3.5–5.2)
Sodium: 141 mmol/L (ref 134–144)
Total Protein: 7.7 g/dL (ref 6.0–8.5)
eGFR: 87 mL/min/{1.73_m2} (ref >59)

## 2022-04-11 LAB — LIPID PANEL
Chol/HDL Ratio: 1.8 ratio (ref 0.0–4.4)
Cholesterol, Total: 189 mg/dL (ref 100–199)
HDL: 104 mg/dL (ref >39)
LDL Chol Calc (NIH): 75 mg/dL (ref 0–99)
Triglycerides: 49 mg/dL (ref 0–149)
VLDL Cholesterol Cal: 10 mg/dL (ref 5–40)

## 2022-04-11 NOTE — Addendum Note (Signed)
Addended by: Rolena Infante on: 04/11/2022 07:54 AM   Modules accepted: Orders

## 2022-04-15 LAB — TOXASSURE SELECT 13 (MW), URINE

## 2022-04-24 ENCOUNTER — Telehealth: Payer: Self-pay | Admitting: Nurse Practitioner

## 2022-04-24 MED ORDER — DOXYCYCLINE HYCLATE 100 MG PO TABS: 100.0000 mg | ORAL_TABLET | Freq: Two times a day (BID) | ORAL | 0 refills | Status: DC

## 2022-04-24 NOTE — Telephone Encounter (Signed)
Patient notified and verbalized understanding. 

## 2022-04-24 NOTE — Telephone Encounter (Signed)
Doxycycline sent in for tick bite Meds ordered this encounter  Medications   doxycycline (VIBRA-TABS) 100 MG tablet    Sig: Take 1 tablet (100 mg total) by mouth 2 (two) times daily. 1 po bid    Dispense:  28 tablet    Refill:  0    Order Specific Question:   Supervising Provider    Answer:   Worthy Rancher [id number]   [place], FNP

## 2022-04-24 NOTE — Telephone Encounter (Signed)
Patient had her shingles shot on 3/21 and she had a tick bite about a week ago. She started feeling bad 3 days after the tick bite, around Sunday per patient. Still has a headache and and nauseous feeling on and off. Wanting to know if any of this could be related to the shot or tick bite. Please call back and advise/

## 2022-04-30 NOTE — Telephone Encounter (Signed)
I spoke to pt and she still has one week left of Doxy. Advised pt to continue the medication and take with food and if her symptoms worsen or don't improve to schedule an appt for further evaluation and pt voiced understanding.

## 2022-04-30 NOTE — Telephone Encounter (Signed)
Pt says that she is nauseous has a headache achy and pain all over. Pt thinks that it may be from the tick bite. NO fever. Please call back

## 2022-05-02 ENCOUNTER — Encounter: Payer: Self-pay | Admitting: Nurse Practitioner

## 2022-05-02 ENCOUNTER — Ambulatory Visit: Payer: 59 | Admitting: Nurse Practitioner

## 2022-05-02 VITALS — BP 112/81 | HR 60 | Temp 101.0°F | Resp 20 | Ht 60.0 in | Wt 142.0 lb

## 2022-05-02 DIAGNOSIS — R509 Fever, unspecified: Secondary | ICD-10-CM

## 2022-05-02 DIAGNOSIS — R5081 Fever presenting with conditions classified elsewhere: Secondary | ICD-10-CM | POA: Diagnosis not present

## 2022-05-02 MED ORDER — MECLIZINE HCL 25 MG PO TABS: 25.0000 mg | ORAL_TABLET | Freq: Three times a day (TID) | ORAL | 0 refills | Status: DC | PRN

## 2022-05-02 NOTE — Patient Instructions (Signed)
Fever, Adult     A fever is a high body temperature that is 100.4F (38C) or higher. Brief mild or moderate fevers often have no lasting effects. They often do not need treatment. Moderate or high fevers can feel uncomfortable. Sometimes, they can be a sign of a serious problem. A fever that keeps coming back or that lasts a long time may cause you to lose water in your body (get dehydrated). You can use a thermometer to check for a fever. Temperature can change with: Age. Time of day. Where the temperature is taken, such as: In the mouth. In the opening of the butt (anus). In the ear. Under the arm. On the forehead. Follow these instructions at home: Medicines Take over-the-counter and prescription medicines only as told by your doctor. Follow instructions on how much medicine to take and how often. If you were prescribed antibiotics, take them as told by your doctor. Do not stop taking them even if you start to feel better. General instructions Watch for any changes in your symptoms. Tell your doctor about them. Rest as needed. Drink enough fluid to keep your pee (urine) pale yellow. Bathe or sponge bathe with room-temperature water as needed. This helps to lower your body temperature. Do not use cold water. Do not use too many blankets or wear clothes that are too heavy. You should stay home from work and public places for at least 24 hours after your fever is gone. Your fever should be gone without the use of medicines. Contact a doctor if: You vomit or have watery poop (diarrhea) that does not get better. You cannot eat or drink without vomiting. It hurts when you pee. Your symptoms do not get better with treatment or you have new symptoms. You have a rash on your skin. You have signs of not having enough water in your body, such as: Dark pee, very little pee, or no pee. Cracked lips or dry mouth. Sunken eyes. Sleepiness. Weakness. Get help right away if: You are short of  breath or have trouble breathing. You are dizzy or pass out (faint). You feel mixed up or confused. You have very bad pain in your belly (abdomen). These symptoms may be an emergency. Get help right away. Call 911. Do not wait to see if the symptoms will go away. Do not drive yourself to the hospital. This information is not intended to replace advice given to you by your health care provider. Make sure you discuss any questions you have with your health care provider. Document Revised: 10/08/2021 Document Reviewed: 10/08/2021 Elsevier Patient Education  2023 Elsevier Inc.  

## 2022-05-02 NOTE — Progress Notes (Signed)
   Subjective:    Patient ID: [name], female    DOB: 05-Jan-1971, 52 y.o.   MRN: [id number]   Chief Complaint: Dizziness and Feels bad   Dizziness Pertinent negatives include no abdominal pain, chest pain, diaphoresis, headaches, rash or weakness.    Patient Active Problem List   Diagnosis Date Noted   BMI 26.0-26.9,adult 06/04/2018   Benzodiazepine dependence, continuous 11/30/2017   Controlled substance agreement signed 11/30/2017   Depression 11/02/2014   GAD (generalized anxiety disorder) 11/02/2014   Hypertension 12/03/2012   HGSIL (high grade squamous intraepithelial dysplasia)    LGSIL (low grade squamous intraepithelial dysplasia)    HSV-1 infection    Renal lithiasis    Patient comes in today c/o dizziness and just not feeling well. She had 2 ticks removed a few weeks ago but was treated with doxycycline. She has developed a fever of 101. Flushed all over. All over body aches. No espiratory symptoms.    Review of Systems  Constitutional:  Negative for diaphoresis.  Eyes:  Negative for pain.  Respiratory:  Negative for shortness of breath.   Cardiovascular:  Negative for chest pain, palpitations and leg swelling.  Gastrointestinal:  Negative for abdominal pain.  Endocrine: Negative for polydipsia.  Skin:  Negative for rash.  Neurological:  Positive for dizziness. Negative for weakness and headaches.  Hematological:  Does not bruise/bleed easily.  All other systems reviewed and are negative.      Objective:   Physical Exam Constitutional:      Appearance: Normal appearance.  Cardiovascular:     Rate and Rhythm: Normal rate and regular rhythm.     Heart sounds: Normal heart sounds.  Pulmonary:     Effort: Pulmonary effort is normal.     Breath sounds: Normal breath sounds.  Skin:    General: Skin is warm.     Comments: Face flushed  Neurological:     General: No focal deficit present.     Mental Status: She is alert and oriented to person, place,  and time.    BP 112/81   Pulse 60   Temp (!) 101 F (38.3 C) (Temporal)   Resp 20   Ht 5' (1.524 m)   Wt 142 lb (64.4 kg)   SpO2 92%   BMI 27.73 kg/m         Assessment & Plan:   [name] in today with chief complaint of Dizziness and Feels bad   1. Fever in other diseases Take doxy 3x a day for 2 days then finish remaining doxycycline BID Alternate motrin and tylenol for fever Q3 hours Rest Force fluids - CBC with Differential/Platelet - Lyme Disease Serology w/Reflex - Spotted Fever Group Antibodies   Consulted with Dr Dettinger The above assessment and management plan was discussed with the patient. The patient verbalized understanding of and has agreed to the management plan. Patient is aware to call the clinic if symptoms persist or worsen. Patient is aware when to return to the clinic for a follow-up visit. Patient educated on when it is appropriate to go to the emergency department.   Mary-Margaret Daphine Deutscher, FNP

## 2022-05-06 ENCOUNTER — Encounter: Payer: Self-pay | Admitting: Nurse Practitioner

## 2022-05-07 LAB — CBC WITH DIFFERENTIAL/PLATELET
Basophils Absolute: 0 10*3/uL (ref 0.0–0.2)
Basos: 1 %
EOS (ABSOLUTE): 0.2 10*3/uL (ref 0.0–0.4)
Eos: 6 %
Hematocrit: 43.6 % (ref 34.0–46.6)
Hemoglobin: 15.1 g/dL (ref 11.1–15.9)
Immature Grans (Abs): 0 10*3/uL (ref 0.0–0.1)
Immature Granulocytes: 0 %
Lymphocytes Absolute: 0.6 10*3/uL — ABNORMAL LOW (ref 0.7–3.1)
Lymphs: 17 %
MCH: 31.5 pg (ref 26.6–33.0)
MCHC: 34.6 g/dL (ref 31.5–35.7)
MCV: 91 fL (ref 79–97)
Monocytes Absolute: 0.6 10*3/uL (ref 0.1–0.9)
Monocytes: 17 %
Neutrophils Absolute: 2.3 10*3/uL (ref 1.4–7.0)
Neutrophils: 59 %
Platelets: 241 10*3/uL (ref 150–450)
RBC: 4.79 x10E6/uL (ref 3.77–5.28)
RDW: 12.3 % (ref 11.7–15.4)
WBC: 3.8 10*3/uL (ref 3.4–10.8)

## 2022-05-07 LAB — SPOTTED FEVER GROUP ANTIBODIES
Spotted Fever Group IgG: <1:64 {titer}
Spotted Fever Group IgM: <1:64 {titer}

## 2022-05-07 LAB — LYME DISEASE SEROLOGY W/REFLEX: Lyme Total Antibody EIA: NEGATIVE

## 2022-05-08 ENCOUNTER — Other Ambulatory Visit (HOSPITAL_COMMUNITY)
Admission: RE | Admit: 2022-05-08 | Discharge: 2022-05-08 | Disposition: A | Discharge status: Home / Self Care | Payer: 59 | Source: Ambulatory Visit | Attending: Nurse Practitioner | Admitting: Nurse Practitioner

## 2022-05-08 ENCOUNTER — Telehealth: Payer: Self-pay | Admitting: Nurse Practitioner

## 2022-05-08 ENCOUNTER — Ambulatory Visit (INDEPENDENT_AMBULATORY_CARE_PROVIDER_SITE_OTHER): Payer: 59 | Admitting: Nurse Practitioner

## 2022-05-08 ENCOUNTER — Encounter: Payer: Self-pay | Admitting: Nurse Practitioner

## 2022-05-08 VITALS — BP 110/70 | HR 77 | Ht 61.5 in | Wt 141.0 lb

## 2022-05-08 DIAGNOSIS — Z124 Encounter for screening for malignant neoplasm of cervix: Secondary | ICD-10-CM | POA: Diagnosis present

## 2022-05-08 DIAGNOSIS — Z01419 Encounter for gynecological examination (general) (routine) without abnormal findings: Secondary | ICD-10-CM | POA: Diagnosis not present

## 2022-05-08 DIAGNOSIS — F411 Generalized anxiety disorder: Secondary | ICD-10-CM | POA: Diagnosis not present

## 2022-05-08 DIAGNOSIS — Z780 Asymptomatic menopausal state: Secondary | ICD-10-CM | POA: Diagnosis not present

## 2022-05-08 MED ORDER — ESCITALOPRAM OXALATE 10 MG PO TABS: 10.0000 mg | ORAL_TABLET | Freq: Every day | ORAL | 3 refills | Status: DC

## 2022-05-08 MED ORDER — ESCITALOPRAM OXALATE 20 MG PO TABS: 20.0000 mg | ORAL_TABLET | Freq: Every day | ORAL | 3 refills | Status: DC

## 2022-05-08 NOTE — Progress Notes (Signed)
SENDY PLUTA Jun 14, 1970 [id number]   History:  52 y.o. G1P1 presents for annual exam. LMP March 2023. Was on HRT briefly for mood changes, hot flashes, and night sweats. Did not notice a difference when she stopped taking. She takes Lexapro 30 mg daily for GAD. 1992 cryo, 2009 LGSIL, 2017/2018 CIN-1, 2019 ASCUS neg HPV. HTN and anxiety managed by PCP.   Gynecologic History No LMP recorded. Patient is postmenopausal.   Contraception/Family planning: tubal ligation Sexually active: Yes  Health Maintenance Last Pap: 05/02/21. Results were: Normal, 3-year repeat Last mammogram: 08/01/2021. Results were: Normal Last colonoscopy: 08/31/2020. Results were: Normal, 10-year recall Last Dexa: Not indicated  Past medical history, past surgical history, family history and social history were all reviewed and documented in the EPIC chart. Married. Works at [employer]. 33 yo son. 1 yo granddaughter.  ROS:  A ROS was performed and pertinent positives and negatives are included.  Exam:  Vitals:   05/08/22 1542  BP: 110/70  Pulse: 77  SpO2: 98%  Weight: 141 lb (64 kg)  Height: 5' 1.5" (1.562 m)     Body mass index is 26.21 kg/m.  General appearance:  Normal Thyroid:  Symmetrical, normal in size, without palpable masses or nodularity. Respiratory  Auscultation:  Clear without wheezing or rhonchi Cardiovascular  Auscultation:  Regular rate, without rubs, murmurs or gallops  Edema/varicosities:  Not grossly evident Abdominal  Soft,nontender, without masses, guarding or rebound.  Liver/spleen:  No organomegaly noted  Hernia:  None appreciated  Skin  Inspection:  Grossly normal   Breasts: Examined lying and sitting.   Right: Without masses, retractions, discharge or axillary adenopathy.   Left: Without masses, retractions, discharge or axillary adenopathy. Genitourinary   Inguinal/mons:  Normal without inguinal adenopathy  External genitalia:  Normal appearing vulva with no  masses, tenderness, or lesions  BUS/Urethra/Skene's glands:  Normal  Vagina:  Normal appearing with normal color and discharge, no lesions  Cervix:  Normal appearing without discharge or lesions  Uterus:  Normal in size, shape and contour.  Midline and mobile, nontender  Adnexa/parametria:     Rt: Normal in size, without masses or tenderness.   Lt: Normal in size, without masses or tenderness.  Anus and perineum: Normal  Digital rectal exam: Deferred  Patient informed chaperone available to be present for breast and pelvic exam. Patient has requested no chaperone to be present. Patient has been advised what will be completed during breast and pelvic exam.   Assessment/Plan:  52 y.o. G1P1 for annual exam.   Well female exam with routine gynecological exam - Education provided on SBEs, importance of preventative screenings, current guidelines, high calcium diet, regular exercise, and multivitamin daily.  Labs with PCP.   GAD (generalized anxiety disorder) - Plan: escitalopram (LEXAPRO) 10 MG tablet daily, escitalopram (LEXAPRO) 20 MG tablet daily. Increased to 30 mg daily last year and doing well.   Postmenopausal - no HRT, no bleeding.   Screening for cervical cancer - Plan: Cytology - PAP( [facility]). 1992 cryo, 2009 LGSIL, 2017/2018 CIN-1, 2019 ASCUS neg HPV. We discussed current guidelines and 3-year recommended. She requests annual paps. Pap with reflex today.   Screening for breast cancer - Normal mammogram history.  Continue annual screenings.  Normal breast exam today.  Screening for colon cancer - Colonoscopy 08/2020. Will repeat at 10-year interval per GI recommendation.   Screening for osteoporosis - Average risk. Will plan DXA at age 42.   Return in 1 year for annual.  Olivia Mackie DNP, 4:21 PM 05/08/2022

## 2022-05-08 NOTE — Telephone Encounter (Signed)
Patient notified of lab results and verbalized understanding. Pt states that she is feeling better and advised her to contact the office if symptoms worsen

## 2022-05-13 LAB — CYTOLOGY - PAP: Diagnosis: NEGATIVE

## 2022-05-14 ENCOUNTER — Telehealth: Payer: 59

## 2022-05-14 ENCOUNTER — Ambulatory Visit: Payer: 59 | Admitting: Family Medicine

## 2022-05-14 ENCOUNTER — Encounter: Payer: Self-pay | Admitting: Family Medicine

## 2022-05-14 VITALS — BP 128/84 | HR 78 | Ht 61.5 in | Wt 140.0 lb

## 2022-05-14 DIAGNOSIS — R110 Nausea: Secondary | ICD-10-CM | POA: Diagnosis not present

## 2022-05-14 DIAGNOSIS — S20161A Insect bite (nonvenomous) of breast, right breast, initial encounter: Secondary | ICD-10-CM

## 2022-05-14 DIAGNOSIS — W57XXXA Bitten or stung by nonvenomous insect and other nonvenomous arthropods, initial encounter: Secondary | ICD-10-CM

## 2022-05-14 DIAGNOSIS — R5383 Other fatigue: Secondary | ICD-10-CM | POA: Diagnosis not present

## 2022-05-14 MED ORDER — DOXYCYCLINE HYCLATE 100 MG PO TABS: 100.0000 mg | ORAL_TABLET | Freq: Two times a day (BID) | ORAL | 0 refills | Status: DC

## 2022-05-14 NOTE — Addendum Note (Signed)
Addended by: Dorene Sorrow on: 05/14/2022 04:52 PM   Modules accepted: Orders

## 2022-05-14 NOTE — Addendum Note (Signed)
Addended by: Arville Care on: 05/14/2022 04:45 PM   Modules accepted: Orders

## 2022-05-14 NOTE — Progress Notes (Signed)
BP 128/84   Pulse 78   Ht 5' 1.5" (1.562 m)   Wt 140 lb (63.5 kg)   LMP 03/22/2021 (Approximate)   SpO2 98%   BMI 26.02 kg/m    Subjective:   Patient ID: Leslie Ritter, female    DOB: 18-Jul-1970, 52 y.o.   MRN: [id number]  HPI: Leslie Ritter is a 52 y.o. female presenting on 05/14/2022 for Tick Removal (Removed last Sunday from right breast. Doesn't feel well. Has had multiple tick bites this season so far. Tx with Doxy. Has been negative for lymes and RMSF though)   HPI Patient comes in today with complaints of generally just not feeling well.  She says she feels tired and fatigued and has some nausea and feels somewhat achy.  She does not know if this is necessarily correlated but she has had multiple ticks on her this year already and the most recent one she removed about 10 days ago off of her right breast and she did bring that take with her and it did not have a [place].on its back so it was a [employer] tick.  She says it was embedded and then she had another 1 on her right abdomen that was embedded and then 1 on her left abdomen that was embedded but not as good.  She did do a full course of doxycycline starting April 12, about 2 weeks ago.  She was tested for some things including Lyme and [facility] spotted fever at that time and it all came back normal.  She says the course of the doxycycline did not necessarily make her feel that much better.  Relevant past medical, surgical, family and social history reviewed and updated as indicated. Interim medical history since our last visit reviewed. Allergies and medications reviewed and updated.  Review of Systems  Constitutional:  Positive for activity change and fatigue. Negative for chills and fever.  HENT:  Negative for congestion, sinus pressure and sinus pain.   Eyes:  Negative for visual disturbance.  Respiratory:  Negative for chest tightness and shortness of breath.   Cardiovascular:  Negative for chest pain and leg  swelling.  Gastrointestinal:  Positive for nausea. Negative for abdominal pain, constipation, diarrhea, rectal pain and vomiting.  Genitourinary:  Negative for difficulty urinating, dysuria, flank pain, frequency, genital sores, pelvic pain, vaginal bleeding, vaginal discharge and vaginal pain.  Musculoskeletal:  Negative for back pain and gait problem.  Skin:  Negative for rash.  Neurological:  Negative for dizziness, light-headedness and headaches.  Psychiatric/Behavioral:  Negative for agitation and behavioral problems.   All other systems reviewed and are negative.   Per HPI unless specifically indicated above   Allergies as of 05/14/2022   No Known Allergies      Medication List        Accurate as of May 14, 2022  4:42 PM. If you have any questions, ask your nurse or doctor.          clonazePAM 0.5 MG tablet Commonly known as: KLONOPIN Take 1 tablet (0.5 mg total) by mouth 2 (two) times daily as needed for anxiety.   doxycycline 100 MG tablet Commonly known as: VIBRA-TABS Take 1 tablet (100 mg total) by mouth 2 (two) times daily. 1 po bid Started by: Elige Radon Daryn Pisani, MD   escitalopram 10 MG tablet Commonly known as: LEXAPRO Take 1 tablet (10 mg total) by mouth daily. Take daily with 20 mg tablet for total of 30 mg  per day   escitalopram 20 MG tablet Commonly known as: LEXAPRO Take 1 tablet (20 mg total) by mouth daily. Take daily with 10 mg tablet for total of 30 mg per day   HAIR/SKIN/NAILS PO Take by mouth.   lisinopril-hydrochlorothiazide 20-12.5 MG tablet Commonly known as: ZESTORETIC Take 1 tablet by mouth daily.   MULTIVITAL PO Take by mouth.   valACYclovir 500 MG tablet Commonly known as: VALTREX Take twice daily for 3 to 5 days as needed   VITA-C PO Take by mouth.         Objective:   BP 128/84   Pulse 78   Ht 5' 1.5" (1.562 m)   Wt 140 lb (63.5 kg)   LMP 03/22/2021 (Approximate)   SpO2 98%   BMI 26.02 kg/m   Wt Readings  from Last 3 Encounters:  05/14/22 140 lb (63.5 kg)  05/08/22 141 lb (64 kg)  05/02/22 142 lb (64.4 kg)    Physical Exam Vitals and nursing note reviewed.  Constitutional:      General: She is not in acute distress.    Appearance: She is well-developed. She is not diaphoretic.  Eyes:     Conjunctiva/sclera: Conjunctivae normal.  Cardiovascular:     Rate and Rhythm: Normal rate and regular rhythm.     Heart sounds: Normal heart sounds. No murmur heard. Pulmonary:     Effort: Pulmonary effort is normal. No respiratory distress.     Breath sounds: Normal breath sounds. No wheezing.  Musculoskeletal:        General: No swelling or tenderness. Normal range of motion.  Skin:    General: Skin is warm and dry.     Findings: No rash.  Neurological:     Mental Status: She is alert and oriented to person, place, and time.     Coordination: Coordination normal.  Psychiatric:        Behavior: Behavior normal.       Assessment & Plan:   Problem List Items Addressed This Visit   None Visit Diagnoses     Tick bite of right breast, initial encounter    -  Primary   Relevant Orders   CBC with Differential/Platelet   CMP14+EGFR   Thyroid Panel With TSH   Alpha-Gal Panel   Rocky mtn spotted fvr abs pnl(IgG+IgM)   Epstein-Barr virus VCA antibody panel   Lyme Disease Serology w/Reflex   Other fatigue       Relevant Orders   CBC with Differential/Platelet   CMP14+EGFR   Thyroid Panel With TSH   Alpha-Gal Panel   Rocky mtn spotted fvr abs pnl(IgG+IgM)   Epstein-Barr virus VCA antibody panel   Lyme Disease Serology w/Reflex   Nausea       Relevant Orders   CBC with Differential/Platelet   CMP14+EGFR   Thyroid Panel With TSH   Alpha-Gal Panel   Rocky mtn spotted fvr abs pnl(IgG+IgM)   Epstein-Barr virus VCA antibody panel   Lyme Disease Serology w/Reflex       Having a lot of vague symptoms, will do a more extensive testing including the tickborne illnesses but also mono and  other blood counts and thyroid, also sent a 3-week course of doxycycline to see if it gives a little bit more help if it is a tickborne illness. Follow up plan: Return if symptoms worsen or fail to improve.  Counseling provided for all of the vaccine components Orders Placed This Encounter  Procedures   CBC with Differential/Platelet  CMP14+EGFR   Thyroid Panel With TSH   Alpha-Gal Panel   Rocky mtn spotted fvr abs pnl(IgG+IgM)   Epstein-Barr virus VCA antibody panel   Lyme Disease Serology w/Reflex    Arville Care, MD [facility] 05/14/2022, 4:42 PM

## 2022-05-15 LAB — MONO QUAL W/RFLX QN: Mono Qual W/Rflx Qn: NEGATIVE

## 2022-05-19 LAB — ALPHA-GAL PANEL
Allergen Lamb IgE: 1.91 kU/L — AB
Beef IgE: 2.68 kU/L — AB
IgE (Immunoglobulin E), Serum: 240 IU/mL (ref 6–495)
O215-IgE Alpha-Gal: 8.34 kU/L — AB
Pork IgE: 1.77 kU/L — AB

## 2022-05-19 LAB — CMP14+EGFR
ALT: 55 IU/L — ABNORMAL HIGH (ref 0–32)
AST: 24 IU/L (ref 0–40)
Albumin/Globulin Ratio: 1.8 (ref 1.2–2.2)
Albumin: 4.4 g/dL (ref 3.8–4.9)
Alkaline Phosphatase: 66 IU/L (ref 44–121)
BUN/Creatinine Ratio: 23 (ref 9–23)
BUN: 18 mg/dL (ref 6–24)
Bilirubin Total: 0.7 mg/dL (ref 0.0–1.2)
CO2: 25 mmol/L (ref 20–29)
Calcium: 9.8 mg/dL (ref 8.7–10.2)
Chloride: 101 mmol/L (ref 96–106)
Creatinine, Ser: 0.77 mg/dL (ref 0.57–1.00)
Globulin, Total: 2.5 g/dL (ref 1.5–4.5)
Glucose: 101 mg/dL — ABNORMAL HIGH (ref 70–99)
Potassium: 5.1 mmol/L (ref 3.5–5.2)
Sodium: 141 mmol/L (ref 134–144)
Total Protein: 6.9 g/dL (ref 6.0–8.5)
eGFR: 93 mL/min/{1.73_m2} (ref >59)

## 2022-05-19 LAB — CBC WITH DIFFERENTIAL/PLATELET
Basophils Absolute: 0.1 10*3/uL (ref 0.0–0.2)
Basos: 1 %
EOS (ABSOLUTE): 0.2 10*3/uL (ref 0.0–0.4)
Eos: 2 %
Hematocrit: 38.6 % (ref 34.0–46.6)
Hemoglobin: 13.5 g/dL (ref 11.1–15.9)
Immature Grans (Abs): 0 10*3/uL (ref 0.0–0.1)
Immature Granulocytes: 0 %
Lymphocytes Absolute: 2.2 10*3/uL (ref 0.7–3.1)
Lymphs: 30 %
MCH: 31.7 pg (ref 26.6–33.0)
MCHC: 35 g/dL (ref 31.5–35.7)
MCV: 91 fL (ref 79–97)
Monocytes Absolute: 0.9 10*3/uL (ref 0.1–0.9)
Monocytes: 13 %
Neutrophils Absolute: 3.9 10*3/uL (ref 1.4–7.0)
Neutrophils: 54 %
Platelets: 529 10*3/uL — ABNORMAL HIGH (ref 150–450)
RBC: 4.26 x10E6/uL (ref 3.77–5.28)
RDW: 12.5 % (ref 11.7–15.4)
WBC: 7.3 10*3/uL (ref 3.4–10.8)

## 2022-05-19 LAB — SPOTTED FEVER GROUP ANTIBODIES
Spotted Fever Group IgG: <1:64 {titer}
Spotted Fever Group IgM: <1:64 {titer}

## 2022-05-19 LAB — THYROID PANEL WITH TSH
Free Thyroxine Index: 1.8 (ref 1.2–4.9)
T3 Uptake Ratio: 26 % (ref 24–39)
T4, Total: 7 ug/dL (ref 4.5–12.0)
TSH: 1.11 u[IU]/mL (ref 0.450–4.500)

## 2022-05-19 LAB — LYME DISEASE SEROLOGY W/REFLEX: Lyme Total Antibody EIA: NEGATIVE

## 2022-05-20 ENCOUNTER — Telehealth: Payer: Self-pay | Admitting: Nurse Practitioner

## 2022-05-20 NOTE — Telephone Encounter (Signed)
All were negative

## 2022-05-20 NOTE — Telephone Encounter (Signed)
Patient calling to check on results from 4/24, said she read them on MyChart but wants to discuss with nurse. Please review and call back.

## 2022-05-20 NOTE — Telephone Encounter (Signed)
Please review Alpha Gal results

## 2022-05-21 NOTE — Telephone Encounter (Signed)
Pt says she has been very sick and thinks it could be the antibiotic she is on. Unsure if she should keep taking it or be switched to something else. Wants MMM to advise and call back.

## 2022-05-22 ENCOUNTER — Encounter: Payer: Self-pay | Admitting: Family Medicine

## 2022-05-22 NOTE — Telephone Encounter (Signed)
Spoke with patient and she is aware of all results. Wants to know if she should continue the antibiotic that you prescribed or if she can stop? Please advise and route to clinical pool

## 2022-05-29 ENCOUNTER — Telehealth: Payer: Self-pay | Admitting: Nurse Practitioner

## 2022-05-30 MED ORDER — EPINEPHRINE 0.3 MG/0.3ML IJ SOAJ: 0.3000 mg | INTRAMUSCULAR | 2 refills | Status: DC | PRN

## 2022-05-30 NOTE — Telephone Encounter (Signed)
Meds ordered this encounter  Medications   EPINEPHrine (EPIPEN 2-PAK) 0.3 mg/0.3 mL IJ SOAJ injection    Sig: Inject 0.3 mg into the muscle as needed for anaphylaxis.    Dispense:  2 each    Refill:  2    Order Specific Question:   Supervising Provider    Answer:   Nils Pyle [[id number]]   Mary-Margaret Daphine Deutscher, FNP

## 2022-06-13 ENCOUNTER — Emergency Department (HOSPITAL_COMMUNITY)
Admission: EM | Admit: 2022-06-13 | Discharge: 2022-06-13 | Disposition: A | Discharge status: Home / Self Care | Payer: 59 | Attending: Emergency Medicine | Admitting: Emergency Medicine

## 2022-06-13 ENCOUNTER — Encounter (HOSPITAL_COMMUNITY): Payer: Self-pay | Admitting: Emergency Medicine

## 2022-06-13 ENCOUNTER — Other Ambulatory Visit: Payer: Self-pay

## 2022-06-13 ENCOUNTER — Emergency Department (HOSPITAL_COMMUNITY): Payer: 59

## 2022-06-13 DIAGNOSIS — X58XXXA Exposure to other specified factors, initial encounter: Secondary | ICD-10-CM | POA: Diagnosis not present

## 2022-06-13 DIAGNOSIS — S29012A Strain of muscle and tendon of back wall of thorax, initial encounter: Secondary | ICD-10-CM | POA: Diagnosis not present

## 2022-06-13 DIAGNOSIS — T148XXA Other injury of unspecified body region, initial encounter: Secondary | ICD-10-CM

## 2022-06-13 DIAGNOSIS — R109 Unspecified abdominal pain: Secondary | ICD-10-CM | POA: Diagnosis present

## 2022-06-13 LAB — URINALYSIS, ROUTINE W REFLEX MICROSCOPIC
Bilirubin Urine: NEGATIVE
Glucose, UA: NEGATIVE mg/dL
Hgb urine dipstick: NEGATIVE
Ketones, ur: 5 mg/dL — AB
Leukocytes,Ua: NEGATIVE
Nitrite: NEGATIVE
Protein, ur: NEGATIVE mg/dL
Specific Gravity, Urine: 1.01 (ref 1.005–1.030)
pH: 6 (ref 5.0–8.0)

## 2022-06-13 LAB — BASIC METABOLIC PANEL WITH GFR
Anion gap: 11 (ref 5–15)
BUN: 16 mg/dL (ref 6–20)
CO2: 28 mmol/L (ref 22–32)
Calcium: 9.6 mg/dL (ref 8.9–10.3)
Chloride: 98 mmol/L (ref 98–111)
Creatinine, Ser: 0.64 mg/dL (ref 0.44–1.00)
GFR, Estimated: >60 mL/min
Glucose, Bld: 117 mg/dL — ABNORMAL HIGH (ref 70–99)
Potassium: 4.3 mmol/L (ref 3.5–5.1)
Sodium: 137 mmol/L (ref 135–145)

## 2022-06-13 LAB — CBC
HCT: 43 % (ref 36.0–46.0)
Hemoglobin: 14.8 g/dL (ref 12.0–15.0)
MCH: 32.1 pg (ref 26.0–34.0)
MCHC: 34.4 g/dL (ref 30.0–36.0)
MCV: 93.3 fL (ref 80.0–100.0)
Platelets: 302 10*3/uL (ref 150–400)
RBC: 4.61 MIL/uL (ref 3.87–5.11)
RDW: 13.2 % (ref 11.5–15.5)
WBC: 8 10*3/uL (ref 4.0–10.5)
nRBC: 0 % (ref 0.0–0.2)

## 2022-06-13 LAB — D-DIMER, QUANTITATIVE: D-Dimer, Quant: 0.37 ug/mL-FEU (ref 0.00–0.50)

## 2022-06-13 MED ORDER — ACETAMINOPHEN 500 MG PO TABS
1000.0000 mg | ORAL_TABLET | Freq: Once | ORAL | Status: AC
  Administered 2022-06-13: 1000 mg via ORAL
  Filled 2022-06-13: qty 2

## 2022-06-13 MED ORDER — LIDOCAINE 5 % EX PTCH
1.0000 | MEDICATED_PATCH | CUTANEOUS | Status: DC
  Administered 2022-06-13: 1 via TRANSDERMAL
  Filled 2022-06-13: qty 1

## 2022-06-13 MED ORDER — IBUPROFEN 800 MG PO TABS
800.0000 mg | ORAL_TABLET | Freq: Once | ORAL | Status: AC
  Administered 2022-06-13: 800 mg via ORAL
  Filled 2022-06-13: qty 1

## 2022-06-13 NOTE — ED Triage Notes (Signed)
Pt via POV c/o right flank pain x 3 days, worse with deep inhalation. Recently traveled to [facility] by plane, returned home yesterday. Pt reports some nausea, no vomiting, poor appetite. No urinary symptoms. Pt has hx renal stones but says this feels different.

## 2022-06-13 NOTE — Discharge Instructions (Signed)
You were seen for your flank pain in the emergency department.   At home, please use Tylenol, ibuprofen, and lidocaine patches we have prescribed you as needed for pain.   Follow-up with your primary doctor in 2-3 days regarding your visit.    Return immediately to the emergency department if you experience any of the following: Right lower quadrant pain, vomiting, fevers, or any other concerning symptoms.    Thank you for visiting our Emergency Department. It was a pleasure taking care of you today.

## 2022-06-17 NOTE — ED Provider Notes (Signed)
[place] EMERGENCY DEPARTMENT AT [facility] Provider Note   CSN: [id number] Arrival date & time: 06/13/22  1242     History {Add pertinent medical, surgical, social history, OB history to HPI:1} Chief Complaint  Patient presents with   Flank Pain    Leslie Ritter is a 52 y.o. female.  HPI     Home Medications Prior to Admission medications   Medication Sig Start Date End Date Taking? Authorizing Provider  EPINEPHrine (EPIPEN 2-PAK) 0.3 mg/0.3 mL IJ SOAJ injection Inject 0.3 mg into the muscle as needed for anaphylaxis. 05/30/22   Daphine Deutscher, Mary-Margaret, FNP  Ascorbic Acid (VITA-C PO) Take by mouth.    [provider]  Biotin w/ Vitamins C & E (HAIR/SKIN/NAILS PO) Take by mouth.    [provider]  clonazePAM (KLONOPIN) 0.5 MG tablet Take 1 tablet (0.5 mg total) by mouth 2 (two) times daily as needed for anxiety. 04/10/22   Daphine Deutscher, Mary-Margaret, FNP  doxycycline (VIBRA-TABS) 100 MG tablet Take 1 tablet (100 mg total) by mouth 2 (two) times daily. 1 po bid 05/14/22   Dettinger, Elige Radon, MD  escitalopram (LEXAPRO) 10 MG tablet Take 1 tablet (10 mg total) by mouth daily. Take daily with 20 mg tablet for total of 30 mg per day 05/08/22   Wyline Beady A, NP  escitalopram (LEXAPRO) 20 MG tablet Take 1 tablet (20 mg total) by mouth daily. Take daily with 10 mg tablet for total of 30 mg per day 05/08/22   Wyline Beady A, NP  lisinopril-hydrochlorothiazide (ZESTORETIC) 20-12.5 MG tablet Take 1 tablet by mouth daily. 04/10/22   Daphine Deutscher Mary-Margaret, FNP  Multiple Vitamins-Minerals (MULTIVITAL PO) Take by mouth.    [provider]  valACYclovir (VALTREX) 500 MG tablet Take twice daily for 3 to 5 days as needed 04/10/22   Bennie Pierini, FNP      Allergies    Patient has no known allergies.    Review of Systems   Review of Systems  Physical Exam Updated Vital Signs BP (!) 135/95   Pulse 75   Temp 98.3 F (36.8 C) (Oral)   Resp 18    Ht 5' 1.5" (1.562 m)   Wt 62.6 kg   LMP 03/22/2021 (Approximate)   SpO2 100%   BMI 25.65 kg/m  Physical Exam  ED Results / Procedures / Treatments   Labs (all labs ordered are listed, but only abnormal results are displayed) Labs Reviewed  URINALYSIS, ROUTINE W REFLEX MICROSCOPIC - Abnormal; Notable for the following components:      Result Value   Color, Urine STRAW (*)    Ketones, ur 5 (*)    All other components within normal limits  BASIC METABOLIC PANEL - Abnormal; Notable for the following components:   Glucose, Bld 117 (*)    All other components within normal limits  CBC  D-DIMER, QUANTITATIVE    EKG None  Radiology No results found.  Procedures Procedures  {Document cardiac monitor, telemetry assessment procedure when appropriate:1}  Medications Ordered in ED Medications  acetaminophen (TYLENOL) tablet 1,000 mg (1,000 mg Oral Given 06/13/22 1729)  ibuprofen (ADVIL) tablet 800 mg (800 mg Oral Given 06/13/22 1729)    ED Course/ Medical Decision Making/ A&P Clinical Course as of 06/17/22 1441  Fri Jun 13, 2022  1952 No right lower quadrant tenderness to palpation her symptoms would be concerning for appendicitis at this time [RP]    Clinical Course User Index [RP] Rondel Baton, MD   {  Click here for ABCD2, HEART and other calculatorsREFRESH Note before signing :1}                          Medical Decision Making Amount and/or Complexity of Data Reviewed Labs: ordered. Radiology: ordered.  Risk OTC drugs. Prescription drug management.   ***  {Document critical care time when appropriate:1} {Document review of labs and clinical decision tools ie heart score, Chads2Vasc2 etc:1}  {Document your independent review of radiology images, and any outside records:1} {Document your discussion with family members, caretakers, and with consultants:1} {Document social determinants of health affecting pt's care:1} {Document your decision making why or  why not admission, treatments were needed:1} Final Clinical Impression(s) / ED Diagnoses Final diagnoses:  Right flank pain  Muscle strain    Rx / DC Orders ED Discharge Orders     None

## 2022-08-07 ENCOUNTER — Ambulatory Visit
Admission: RE | Admit: 2022-08-07 | Discharge: 2022-08-07 | Disposition: A | Discharge status: Home / Self Care | Payer: 59 | Source: Ambulatory Visit | Attending: Nurse Practitioner | Admitting: Nurse Practitioner

## 2022-08-07 DIAGNOSIS — Z1231 Encounter for screening mammogram for malignant neoplasm of breast: Secondary | ICD-10-CM

## 2022-10-09 ENCOUNTER — Ambulatory Visit: Payer: 59 | Admitting: Nurse Practitioner

## 2022-10-13 ENCOUNTER — Other Ambulatory Visit: Payer: Self-pay | Admitting: Nurse Practitioner

## 2022-10-13 DIAGNOSIS — F411 Generalized anxiety disorder: Secondary | ICD-10-CM

## 2022-10-16 ENCOUNTER — Ambulatory Visit (INDEPENDENT_AMBULATORY_CARE_PROVIDER_SITE_OTHER): Payer: 59 | Admitting: Nurse Practitioner

## 2022-10-16 ENCOUNTER — Encounter: Payer: Self-pay | Admitting: Nurse Practitioner

## 2022-10-16 VITALS — BP 110/78 | HR 84 | Temp 97.9°F | Resp 20 | Ht 61.0 in | Wt 140.0 lb

## 2022-10-16 DIAGNOSIS — F411 Generalized anxiety disorder: Secondary | ICD-10-CM

## 2022-10-16 DIAGNOSIS — F3342 Major depressive disorder, recurrent, in full remission: Secondary | ICD-10-CM

## 2022-10-16 DIAGNOSIS — I10 Essential (primary) hypertension: Secondary | ICD-10-CM

## 2022-10-16 DIAGNOSIS — Z23 Encounter for immunization: Secondary | ICD-10-CM | POA: Diagnosis not present

## 2022-10-16 DIAGNOSIS — Z6826 Body mass index (BMI) 26.0-26.9, adult: Secondary | ICD-10-CM | POA: Diagnosis not present

## 2022-10-16 MED ORDER — ESCITALOPRAM OXALATE 10 MG PO TABS
10.0000 mg | ORAL_TABLET | Freq: Every day | ORAL | 3 refills | Status: DC
Start: 2022-10-16 — End: 2023-04-16

## 2022-10-16 MED ORDER — LISINOPRIL-HYDROCHLOROTHIAZIDE 20-12.5 MG PO TABS: 1.0000 | ORAL_TABLET | Freq: Every day | ORAL | 3 refills | Status: DC

## 2022-10-16 MED ORDER — ESCITALOPRAM OXALATE 20 MG PO TABS: 20.0000 mg | ORAL_TABLET | Freq: Every day | ORAL | 3 refills | Status: DC

## 2022-10-16 NOTE — Patient Instructions (Signed)

## 2022-10-16 NOTE — Progress Notes (Signed)
Subjective:    Patient ID: Leslie Ritter, female    DOB: 1970/03/10, 52 y.o.   MRN: [id number]   Chief Complaint: medical management of chronic issues     HPI:  Leslie Ritter is a 52 y.o. who identifies as a female who was assigned female at birth.   Social history: Lives with: with husband Work history: works at [employer] in today for follow up of the following chronic medical issues:  1. Primary hypertension No c/o chest pain, sob or headache. Does not check blood pressure at home. BP Readings from Last 3 Encounters:  06/13/22 (!) 135/95  05/14/22 128/84  05/08/22 110/70     2. GAD (generalized anxiety disorder) Has family issues going on that keep her stressed. Takes klonopin BID    10/16/2022    4:08 PM 10/10/2021    4:17 PM 04/11/2021    2:35 PM 10/12/2020   12:19 PM  GAD 7 : Generalized Anxiety Score  Nervous, Anxious, on Edge 2 2 3 2   Control/stop worrying 2 2 3 1   Worry too much - different things 1 3 3 1   Trouble relaxing 1 2 3 2   Restless 1 1 2 1   Easily annoyed or irritable 2 2 3 2   Afraid - awful might happen 1 2 2 1   Total GAD 7 Score 10 14 19 10   Anxiety Difficulty Not difficult at all Somewhat difficult Somewhat difficult Somewhat difficult      3. Recurrent major depressive disorder, in full remission (HCC) Is on lexapro and is doing well.    10/16/2022    4:07 PM 05/14/2022    4:18 PM 04/10/2022    4:03 PM  Depression screen PHQ 2/9  Decreased Interest 1 1 1   Down, Depressed, Hopeless 1 1 1   PHQ - 2 Score 2 2 2   Altered sleeping 2 1 1   Tired, decreased energy 1 2 2   Change in appetite 0 0 0  Feeling bad or failure about yourself  1 1 1   Trouble concentrating 0 0 0  Moving slowly or fidgety/restless 1 0 0  Suicidal thoughts 0 0 0  PHQ-9 Score 7 6 6   Difficult doing work/chores Somewhat difficult Somewhat difficult Somewhat difficult     4. BMI 26.0-26.9,adult No recent weight changes   New complaints: None  today  No Known Allergies Outpatient Encounter Medications as of 10/16/2022  Medication Sig   EPINEPHrine (EPIPEN 2-PAK) 0.3 mg/0.3 mL IJ SOAJ injection Inject 0.3 mg into the muscle as needed for anaphylaxis.   Ascorbic Acid (VITA-C PO) Take by mouth.   Biotin w/ Vitamins C & E (HAIR/SKIN/NAILS PO) Take by mouth.   clonazePAM (KLONOPIN) 0.5 MG tablet Take 1 tablet (0.5 mg total) by mouth 2 (two) times daily as needed for anxiety.   doxycycline (VIBRA-TABS) 100 MG tablet Take 1 tablet (100 mg total) by mouth 2 (two) times daily. 1 po bid   escitalopram (LEXAPRO) 10 MG tablet Take 1 tablet (10 mg total) by mouth daily. Take daily with 20 mg tablet for total of 30 mg per day   escitalopram (LEXAPRO) 20 MG tablet Take 1 tablet (20 mg total) by mouth daily. Take daily with 10 mg tablet for total of 30 mg per day   lisinopril-hydrochlorothiazide (ZESTORETIC) 20-12.5 MG tablet Take 1 tablet by mouth daily.   Multiple Vitamins-Minerals (MULTIVITAL PO) Take by mouth.   valACYclovir (VALTREX) 500 MG tablet Take twice daily for 3 to 5  days as needed   No facility-administered encounter medications on file as of 10/16/2022.    Past Surgical History:  Procedure Laterality Date   Arm surg-Radial nerve     GYNECOLOGIC CRYOSURGERY  1992   LAPAROSCOPIC TUBAL LIGATION Bilateral 03/30/2012   Procedure: LAPAROSCOPIC TUBAL LIGATION;  Surgeon: Dara Lords, MD;  Location: [facility];  Service: Gynecology;  Laterality: Bilateral;   mole removed     face - left cheek   TENDON RECONSTRUCTION Right    WISDOM TOOTH EXTRACTION      Family History  Problem Relation Age of Onset   Hypertension Mother    Hypertension Sister    Diabetes Maternal Grandmother    Hypertension Maternal Grandfather    Heart disease Maternal Grandfather    Colon cancer Neg Hx    Colon polyps Neg Hx    Esophageal cancer Neg Hx    Stomach cancer Neg Hx    Rectal cancer Neg Hx       Controlled substance contract:  n/a     Review of Systems  Constitutional:  Negative for diaphoresis.  Eyes:  Negative for pain.  Respiratory:  Negative for shortness of breath.   Cardiovascular:  Negative for chest pain, palpitations and leg swelling.  Gastrointestinal:  Negative for abdominal pain.  Endocrine: Negative for polydipsia.  Skin:  Negative for rash.  Neurological:  Negative for dizziness, weakness and headaches.  Hematological:  Does not bruise/bleed easily.  All other systems reviewed and are negative.      Objective:   Physical Exam Vitals and nursing note reviewed.  Constitutional:      General: She is not in acute distress.    Appearance: Normal appearance. She is well-developed.  HENT:     Head: Normocephalic.     Right Ear: Tympanic membrane normal.     Left Ear: Tympanic membrane normal.     Nose: Nose normal.     Mouth/Throat:     Mouth: Mucous membranes are moist.  Eyes:     Pupils: Pupils are equal, round, and reactive to light.  Neck:     Vascular: No carotid bruit or JVD.  Cardiovascular:     Rate and Rhythm: Normal rate and regular rhythm.     Heart sounds: Normal heart sounds.  Pulmonary:     Effort: Pulmonary effort is normal. No respiratory distress.     Breath sounds: Normal breath sounds. No wheezing or rales.  Chest:     Chest wall: No tenderness.  Abdominal:     General: Bowel sounds are normal. There is no distension or abdominal bruit.     Palpations: Abdomen is soft. There is no hepatomegaly, splenomegaly, mass or pulsatile mass.     Tenderness: There is no abdominal tenderness.  Musculoskeletal:        General: Normal range of motion.     Cervical back: Normal range of motion and neck supple.  Lymphadenopathy:     Cervical: No cervical adenopathy.  Skin:    General: Skin is warm and dry.  Neurological:     Mental Status: She is alert and oriented to person, place, and time.     Deep Tendon Reflexes: Reflexes are normal and symmetric.  Psychiatric:         Behavior: Behavior normal.        Thought Content: Thought content normal.        Judgment: Judgment normal.    BP 110/78   Pulse 84   Temp 97.9  F (36.6 C) (Temporal)   Resp 20   Ht 5\' 1"  (1.549 m)   Wt 140 lb (63.5 kg)   LMP 03/22/2021 (Approximate)   SpO2 97%   BMI 26.45 kg/m         Assessment & Plan:  Leslie Ritter comes in today with chief complaint of Medical Management of Chronic Issues   Diagnosis and orders addressed:  1. Primary hypertension Low sodium diet - lisinopril-hydrochlorothiazide (ZESTORETIC) 20-12.5 MG tablet; Take 1 tablet by mouth daily.  Dispense: 90 tablet; Refill: 3  2. GAD (generalized anxiety disorder) Stress management - escitalopram (LEXAPRO) 20 MG tablet; Take 1 tablet (20 mg total) by mouth daily. Take daily with 10 mg tablet for total of 30 mg per day  Dispense: 90 tablet; Refill: 3 - escitalopram (LEXAPRO) 10 MG tablet; Take 1 tablet (10 mg total) by mouth daily. Take daily with 20 mg tablet for total of 30 mg per day  Dispense: 90 tablet; Refill: 3  3. Recurrent major depressive disorder, in full remission (HCC)  4. BMI 26.0-26.9,adult Discussed diet and exercise for person with BMI >25 Will recheck weight in 3-6 months    Health Maintenance reviewed Diet and exercise encouraged  Follow up plan: 6 months   Mary-Margaret Daphine Deutscher, FNP

## 2022-10-22 ENCOUNTER — Other Ambulatory Visit: Payer: Self-pay | Admitting: Nurse Practitioner

## 2022-10-22 DIAGNOSIS — F411 Generalized anxiety disorder: Secondary | ICD-10-CM

## 2022-10-23 ENCOUNTER — Other Ambulatory Visit: Payer: Self-pay | Admitting: *Deleted

## 2022-10-23 DIAGNOSIS — F411 Generalized anxiety disorder: Secondary | ICD-10-CM

## 2022-10-23 MED ORDER — CLONAZEPAM 0.5 MG PO TABS
0.5000 mg | ORAL_TABLET | Freq: Two times a day (BID) | ORAL | 5 refills | Status: DC | PRN
Start: 2022-10-23 — End: 2023-04-22

## 2022-12-16 ENCOUNTER — Ambulatory Visit: Payer: 59 | Admitting: Radiology

## 2022-12-16 VITALS — BP 124/84 | Temp 98.2°F

## 2022-12-16 DIAGNOSIS — R300 Dysuria: Secondary | ICD-10-CM | POA: Diagnosis not present

## 2022-12-16 LAB — URINALYSIS, COMPLETE W/RFL CULTURE
Bacteria, UA: NONE SEEN /[HPF]
Bilirubin Urine: NEGATIVE
Casts: NONE SEEN /[LPF]
Crystals: NONE SEEN /[HPF]
Glucose, UA: NEGATIVE
Hgb urine dipstick: NEGATIVE
Ketones, ur: NEGATIVE
Leukocyte Esterase: NEGATIVE
Nitrites, Initial: NEGATIVE
Protein, ur: NEGATIVE
RBC / HPF: NONE SEEN /[HPF] (ref 0–2)
Specific Gravity, Urine: 1.02 (ref 1.001–1.035)
WBC, UA: NONE SEEN /[HPF] (ref 0–5)
Yeast: NONE SEEN /[HPF]
pH: 6.5 (ref 5.0–8.0)

## 2022-12-16 LAB — NO CULTURE INDICATED

## 2022-12-16 NOTE — Progress Notes (Signed)
      SUBJECTIVE: Leslie Ritter is a 52 y.o. female who complains of slight dysuria, urgency, frequency, lower back pain, lower pelvic pressure. Symptoms began this morning. No bleeding, bowel or vaginal symptoms.   No Known Allergies  Current Outpatient Medications on File Prior to Visit  Medication Sig Dispense Refill   clonazePAM (KLONOPIN) 0.5 MG tablet Take 1 tablet (0.5 mg total) by mouth 2 (two) times daily as needed for anxiety. 60 tablet 5   EPINEPHrine (EPIPEN 2-PAK) 0.3 mg/0.3 mL IJ SOAJ injection Inject 0.3 mg into the muscle as needed for anaphylaxis. 2 each 2   escitalopram (LEXAPRO) 10 MG tablet Take 1 tablet (10 mg total) by mouth daily. Take daily with 20 mg tablet for total of 30 mg per day 90 tablet 3   escitalopram (LEXAPRO) 20 MG tablet Take 1 tablet (20 mg total) by mouth daily. Take daily with 10 mg tablet for total of 30 mg per day 90 tablet 3   lisinopril-hydrochlorothiazide (ZESTORETIC) 20-12.5 MG tablet Take 1 tablet by mouth daily. 90 tablet 3   valACYclovir (VALTREX) 500 MG tablet Take twice daily for 3 to 5 days as needed 30 tablet 12   Ascorbic Acid (VITA-C PO) Take by mouth. (Patient not taking: Reported on 12/16/2022)     Biotin w/ Vitamins C & E (HAIR/SKIN/NAILS PO) Take by mouth. (Patient not taking: Reported on 12/16/2022)     Multiple Vitamins-Minerals (MULTIVITAL PO) Take by mouth. (Patient not taking: Reported on 10/16/2022)     No current facility-administered medications on file prior to visit.    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  Vital signs are normal. The abdomen is soft without tenderness, guarding, mass, rebound or organomegaly. No CVA tenderness or inguinal adenopathy noted.   Urine  dipstick shows negative for all components.  Micro exam: negative for WBC's or RBC's.    Blood pressure 124/84, temperature 98.2 F (36.8 C), temperature source Oral, last menstrual period 03/22/2021.    Chaperone offered and declined for exam.  ASSESSMENT/PLAN: 1. Dysuria Reassured negative UA. If symptoms persist see PCP or urgent care, may not be bladder related. - Urinalysis,Complete w/RFL Culture     Push fluids, avoid bladder irritants. Instructed she may use Pyridium OTC prn. Call or return to clinic prn if these symptoms worsen or fail to improve as anticipated.   Walid Haig B, NP 2:36 PM

## 2023-02-27 ENCOUNTER — Telehealth: Payer: Self-pay | Admitting: Nurse Practitioner

## 2023-02-27 ENCOUNTER — Other Ambulatory Visit: Payer: Self-pay

## 2023-02-27 MED ORDER — OSELTAMIVIR PHOSPHATE 75 MG PO CAPS: 75.0000 mg | ORAL_CAPSULE | Freq: Every day | ORAL | 0 refills | Status: DC

## 2023-02-27 NOTE — Telephone Encounter (Signed)
 Copied from CRM [id number]. Topic: Clinical - Medical Advice >> Feb 27, 2023  9:51 AM Baldomero Bone wrote: Reason for CRM: Patient exposed to Flu A at work. Can you prescribe her Tamiflu  or does she need an appointment? Callback number is [phone]

## 2023-03-24 ENCOUNTER — Other Ambulatory Visit: Payer: Self-pay | Admitting: Nurse Practitioner

## 2023-03-24 DIAGNOSIS — Z1231 Encounter for screening mammogram for malignant neoplasm of breast: Secondary | ICD-10-CM

## 2023-04-16 ENCOUNTER — Ambulatory Visit (INDEPENDENT_AMBULATORY_CARE_PROVIDER_SITE_OTHER): Payer: 59 | Admitting: Nurse Practitioner

## 2023-04-16 ENCOUNTER — Encounter: Payer: Self-pay | Admitting: Nurse Practitioner

## 2023-04-16 VITALS — BP 130/93 | HR 69 | Temp 96.4°F | Ht 61.0 in | Wt 147.0 lb

## 2023-04-16 DIAGNOSIS — B009 Herpesviral infection, unspecified: Secondary | ICD-10-CM

## 2023-04-16 DIAGNOSIS — F3342 Major depressive disorder, recurrent, in full remission: Secondary | ICD-10-CM

## 2023-04-16 DIAGNOSIS — F411 Generalized anxiety disorder: Secondary | ICD-10-CM

## 2023-04-16 DIAGNOSIS — I10 Essential (primary) hypertension: Secondary | ICD-10-CM

## 2023-04-16 DIAGNOSIS — Z6826 Body mass index (BMI) 26.0-26.9, adult: Secondary | ICD-10-CM

## 2023-04-16 MED ORDER — LISINOPRIL-HYDROCHLOROTHIAZIDE 20-12.5 MG PO TABS: 1.0000 | ORAL_TABLET | Freq: Every day | ORAL | 1 refills | Status: DC

## 2023-04-16 MED ORDER — ESCITALOPRAM OXALATE 20 MG PO TABS: 20.0000 mg | ORAL_TABLET | Freq: Every day | ORAL | 1 refills | Status: DC

## 2023-04-16 MED ORDER — VALACYCLOVIR HCL 500 MG PO TABS
ORAL_TABLET | ORAL | 12 refills | Status: AC
Start: 2023-04-16 — End: ?

## 2023-04-16 MED ORDER — ESCITALOPRAM OXALATE 10 MG PO TABS: 10.0000 mg | ORAL_TABLET | Freq: Every day | ORAL | 1 refills | Status: DC

## 2023-04-16 NOTE — Progress Notes (Signed)
 Subjective:    Patient ID: Leslie Ritter, female    DOB: 1971-01-04, 53 y.o.   MRN: [id number]   Chief Complaint: medical management of chronic issues     HPI:  Leslie Ritter is a 53 y.o. who identifies as a female who was assigned female at birth.   Social history: Lives with: with husband Work history: works at [employer] in today for follow up of the following chronic medical issues:  1. Primary hypertension No c/o chest pain, sob or headache. Does not check blood pressure at home. BP Readings from Last 3 Encounters:  12/16/22 124/84  10/16/22 110/78  06/13/22 (!) 135/95     2. GAD (generalized anxiety disorder) Has family issues going on that keep her stressed. Takes klonopin BID    04/16/2023    3:54 PM 10/16/2022    4:08 PM 10/10/2021    4:17 PM 04/11/2021    2:35 PM  GAD 7 : Generalized Anxiety Score  Nervous, Anxious, on Edge 1 2 2 3   Control/stop worrying 2 2 2 3   Worry too much - different things 2 1 3 3   Trouble relaxing 1 1 2 3   Restless 2 1 1 2   Easily annoyed or irritable 1 2 2 3   Afraid - awful might happen 1 1 2 2   Total GAD 7 Score 10 10 14 19   Anxiety Difficulty Not difficult at all Not difficult at all Somewhat difficult Somewhat difficult        3. Recurrent major depressive disorder, in full remission (HCC) Is on lexapro and is doing well.    04/16/2023    3:54 PM 10/16/2022    4:07 PM 05/14/2022    4:18 PM  Depression screen PHQ 2/9  Decreased Interest 1 1 1   Down, Depressed, Hopeless 1 1 1   PHQ - 2 Score 2 2 2   Altered sleeping 2 2 1   Tired, decreased energy 2 1 2   Change in appetite 1 0 0  Feeling bad or failure about yourself  1 1 1   Trouble concentrating 1 0 0  Moving slowly or fidgety/restless 1 1 0  Suicidal thoughts 0 0 0  PHQ-9 Score 10 7 6   Difficult doing work/chores Not difficult at all Somewhat difficult Somewhat difficult      4. BMI 26.0-26.9,adult No recent weight changes Wt Readings from  Last 3 Encounters:  10/16/22 140 lb (63.5 kg)  06/13/22 138 lb (62.6 kg)  05/14/22 140 lb (63.5 kg)   BMI Readings from Last 3 Encounters:  10/16/22 26.45 kg/m  06/13/22 25.65 kg/m  05/14/22 26.02 kg/m     New complaints: None today  No Known Allergies Outpatient Encounter Medications as of 04/16/2023  Medication Sig   Ascorbic Acid (VITA-C PO) Take by mouth. (Patient not taking: Reported on 12/16/2022)   Biotin w/ Vitamins C & E (HAIR/SKIN/NAILS PO) Take by mouth. (Patient not taking: Reported on 12/16/2022)   clonazePAM (KLONOPIN) 0.5 MG tablet Take 1 tablet (0.5 mg total) by mouth 2 (two) times daily as needed for anxiety.   EPINEPHrine (EPIPEN 2-PAK) 0.3 mg/0.3 mL IJ SOAJ injection Inject 0.3 mg into the muscle as needed for anaphylaxis.   escitalopram (LEXAPRO) 10 MG tablet Take 1 tablet (10 mg total) by mouth daily. Take daily with 20 mg tablet for total of 30 mg per day   escitalopram (LEXAPRO) 20 MG tablet Take 1 tablet (20 mg total) by mouth daily. Take daily with 10 mg  tablet for total of 30 mg per day   lisinopril-hydrochlorothiazide (ZESTORETIC) 20-12.5 MG tablet Take 1 tablet by mouth daily.   Multiple Vitamins-Minerals (MULTIVITAL PO) Take by mouth. (Patient not taking: Reported on 10/16/2022)   oseltamivir (TAMIFLU) 75 MG capsule Take 1 capsule (75 mg total) by mouth daily.   valACYclovir (VALTREX) 500 MG tablet Take twice daily for 3 to 5 days as needed   No facility-administered encounter medications on file as of 04/16/2023.    Past Surgical History:  Procedure Laterality Date   Arm surg-Radial nerve     GYNECOLOGIC CRYOSURGERY  1992   LAPAROSCOPIC TUBAL LIGATION Bilateral 03/30/2012   Procedure: LAPAROSCOPIC TUBAL LIGATION;  Surgeon: Dara Lords, MD;  Location: [facility];  Service: Gynecology;  Laterality: Bilateral;   mole removed     face - left cheek   TENDON RECONSTRUCTION Right    WISDOM TOOTH EXTRACTION      Family History  Problem Relation  Age of Onset   Hypertension Mother    Hypertension Sister    Diabetes Maternal Grandmother    Hypertension Maternal Grandfather    Heart disease Maternal Grandfather    Colon cancer Neg Hx    Colon polyps Neg Hx    Esophageal cancer Neg Hx    Stomach cancer Neg Hx    Rectal cancer Neg Hx       Controlled substance contract: n/a     Review of Systems  Constitutional:  Negative for diaphoresis.  Eyes:  Negative for pain.  Respiratory:  Negative for shortness of breath.   Cardiovascular:  Negative for chest pain, palpitations and leg swelling.  Gastrointestinal:  Negative for abdominal pain.  Endocrine: Negative for polydipsia.  Skin:  Negative for rash.  Neurological:  Negative for dizziness, weakness and headaches.  Hematological:  Does not bruise/bleed easily.  All other systems reviewed and are negative.      Objective:   Physical Exam Vitals and nursing note reviewed.  Constitutional:      General: She is not in acute distress.    Appearance: Normal appearance. She is well-developed.  HENT:     Head: Normocephalic.     Right Ear: Tympanic membrane normal.     Left Ear: Tympanic membrane normal.     Nose: Nose normal.     Mouth/Throat:     Mouth: Mucous membranes are moist.  Eyes:     Pupils: Pupils are equal, round, and reactive to light.  Neck:     Vascular: No carotid bruit or JVD.  Cardiovascular:     Rate and Rhythm: Normal rate and regular rhythm.     Heart sounds: Normal heart sounds.  Pulmonary:     Effort: Pulmonary effort is normal. No respiratory distress.     Breath sounds: Normal breath sounds. No wheezing or rales.  Chest:     Chest wall: No tenderness.  Abdominal:     General: Bowel sounds are normal. There is no distension or abdominal bruit.     Palpations: Abdomen is soft. There is no hepatomegaly, splenomegaly, mass or pulsatile mass.     Tenderness: There is no abdominal tenderness.  Musculoskeletal:        General: Normal range of  motion.     Cervical back: Normal range of motion and neck supple.  Lymphadenopathy:     Cervical: No cervical adenopathy.  Skin:    General: Skin is warm and dry.  Neurological:     Mental Status: She is alert  and oriented to person, place, and time.     Deep Tendon Reflexes: Reflexes are normal and symmetric.  Psychiatric:        Behavior: Behavior normal.        Thought Content: Thought content normal.        Judgment: Judgment normal.    LMP 03/22/2021 (Approximate)         Assessment & Plan:  YITZEL SHASTEEN comes in today with chief complaint of No chief complaint on file.   Diagnosis and orders addressed:  1. Primary hypertension Low sodium diet - lisinopril-hydrochlorothiazide (ZESTORETIC) 20-12.5 MG tablet; Take 1 tablet by mouth daily.  Dispense: 90 tablet; Refill: 3  2. GAD (generalized anxiety disorder) Stress management - escitalopram (LEXAPRO) 20 MG tablet; Take 1 tablet (20 mg total) by mouth daily. Take daily with 10 mg tablet for total of 30 mg per day  Dispense: 90 tablet; Refill: 3 - escitalopram (LEXAPRO) 10 MG tablet; Take 1 tablet (10 mg total) by mouth daily. Take daily with 20 mg tablet for total of 30 mg per day  Dispense: 90 tablet; Refill: 3  3. Recurrent major depressive disorder, in full remission (HCC)  4. BMI 26.0-26.9,adult Discussed diet and exercise for person with BMI >25 Will recheck weight in 3-6 months    Health Maintenance reviewed Diet and exercise encouraged  Follow up plan: 6 months   Mary-Margaret Daphine Deutscher, FNP

## 2023-04-22 ENCOUNTER — Other Ambulatory Visit: Payer: Self-pay

## 2023-04-22 DIAGNOSIS — F411 Generalized anxiety disorder: Secondary | ICD-10-CM

## 2023-04-23 MED ORDER — CLONAZEPAM 0.5 MG PO TABS
0.5000 mg | ORAL_TABLET | Freq: Two times a day (BID) | ORAL | 5 refills | Status: DC | PRN
Start: 2023-04-23 — End: 2023-10-15

## 2023-06-18 ENCOUNTER — Other Ambulatory Visit (HOSPITAL_COMMUNITY)
Admission: RE | Admit: 2023-06-18 | Discharge: 2023-06-18 | Disposition: A | Discharge status: Home / Self Care | Source: Ambulatory Visit | Attending: Nurse Practitioner | Admitting: Nurse Practitioner

## 2023-06-18 ENCOUNTER — Encounter: Payer: Self-pay | Admitting: Nurse Practitioner

## 2023-06-18 ENCOUNTER — Ambulatory Visit (INDEPENDENT_AMBULATORY_CARE_PROVIDER_SITE_OTHER): Admitting: Nurse Practitioner

## 2023-06-18 VITALS — BP 124/82 | HR 86 | Ht 60.0 in | Wt 148.0 lb

## 2023-06-18 DIAGNOSIS — Z780 Asymptomatic menopausal state: Secondary | ICD-10-CM | POA: Diagnosis not present

## 2023-06-18 DIAGNOSIS — F411 Generalized anxiety disorder: Secondary | ICD-10-CM | POA: Diagnosis not present

## 2023-06-18 DIAGNOSIS — Z1331 Encounter for screening for depression: Secondary | ICD-10-CM | POA: Diagnosis not present

## 2023-06-18 DIAGNOSIS — Z01419 Encounter for gynecological examination (general) (routine) without abnormal findings: Secondary | ICD-10-CM

## 2023-06-18 DIAGNOSIS — Z124 Encounter for screening for malignant neoplasm of cervix: Secondary | ICD-10-CM

## 2023-06-18 NOTE — Progress Notes (Signed)
 Leslie Ritter 1970-04-07 [id number]   History:  53 y.o. G1P1 presents for annual exam. Postmenopausal - no HRT. She takes Lexapro  30 mg daily for GAD. 1992 cryo, 2009 LGSIL, 2017/2018 CIN-1, 2019 ASCUS neg HPV. HTN and anxiety managed by PCP.   Gynecologic History Patient's last menstrual period was 03/22/2021 (approximate).   Contraception/Family planning: tubal ligation Sexually active: Yes  Health Maintenance Last Pap: 05/08/2022. Results were: Normal Last mammogram: 08/07/2022. Results were: Normal Last colonoscopy: 08/31/2020. Results were: Normal, 10-year recall Last Dexa: Not indicated     06/18/2023    3:50 PM  Depression screen PHQ 2/9  Decreased Interest 0  Down, Depressed, Hopeless 0  PHQ - 2 Score 0     Past medical history, past surgical history, family history and social history were all reviewed and documented in the EPIC chart. Married. Works at [employer]. 74 yo son. 2 yo granddaughter. 2 stepsons.   ROS:  A ROS was performed and pertinent positives and negatives are included.  Exam:  Vitals:   06/18/23 1545  BP: 124/82  Pulse: 86  SpO2: 96%  Weight: 148 lb (67.1 kg)  Height: 5' (1.524 m)     Body mass index is 28.9 kg/m.  General appearance:  Normal Thyroid :  Symmetrical, normal in size, without palpable masses or nodularity. Respiratory  Auscultation:  Clear without wheezing or rhonchi Cardiovascular  Auscultation:  Regular rate, without rubs, murmurs or gallops  Edema/varicosities:  Not grossly evident Abdominal  Soft,nontender, without masses, guarding or rebound.  Liver/spleen:  No organomegaly noted  Hernia:  None appreciated  Skin  Inspection:  Grossly normal   Breasts: Examined lying and sitting.   Right: Without masses, retractions, discharge or axillary adenopathy.   Left: Without masses, retractions, discharge or axillary adenopathy. Pelvic: External genitalia:  no lesions              Urethra:  normal appearing urethra with  no masses, tenderness or lesions              Bartholins and Skenes: normal                 Vagina: normal appearing vagina with normal color and discharge, no lesions              Cervix: no lesions Bimanual Exam:  Uterus:  no masses or tenderness              Adnexa: no mass, fullness, tenderness              Rectovaginal: Deferred              Anus:  normal, no lesions  Patient informed chaperone available to be present for breast and pelvic exam. Patient has requested no chaperone to be present. Patient has been advised what will be completed during breast and pelvic exam.   Assessment/Plan:  53 y.o. G1P1 for annual exam.   Well female exam with routine gynecological exam - Education provided on SBEs, importance of preventative screenings, current guidelines, high calcium diet, regular exercise, and multivitamin daily.  Labs with PCP.   GAD (generalized anxiety disorder) - Doing well on this. PCP provided refills.   Postmenopausal - no HRT, no bleeding  Screening for cervical cancer - Plan: Cytology - PAP( [place]). 1992 cryo, 2009 LGSIL, 2017/2018 CIN-1, 2019 ASCUS neg HPV. We discussed current guidelines and 3-year recommended. She requests annual paps.   Screening for breast cancer - Normal mammogram history.  Continue  annual screenings.  Normal breast exam today.  Screening for colon cancer - Colonoscopy 08/2020. Will repeat at 10-year interval per GI recommendation.   Screening for osteoporosis - Average risk. Will plan DXA at age 56.   Return in about 1 year (around 06/17/2024) for Annual.    Andee Bamberger DNP, 4:22 PM 06/18/2023

## 2023-06-23 LAB — CYTOLOGY - PAP: Diagnosis: NEGATIVE

## 2023-06-25 ENCOUNTER — Ambulatory Visit: Payer: Self-pay | Admitting: Nurse Practitioner

## 2023-08-13 ENCOUNTER — Ambulatory Visit
Admission: RE | Admit: 2023-08-13 | Discharge: 2023-08-13 | Disposition: A | Discharge status: Home / Self Care | Source: Ambulatory Visit | Attending: Nurse Practitioner | Admitting: Nurse Practitioner

## 2023-08-13 DIAGNOSIS — Z1231 Encounter for screening mammogram for malignant neoplasm of breast: Secondary | ICD-10-CM

## 2023-08-19 ENCOUNTER — Other Ambulatory Visit: Payer: Self-pay | Admitting: Nurse Practitioner

## 2023-08-19 DIAGNOSIS — R928 Other abnormal and inconclusive findings on diagnostic imaging of breast: Secondary | ICD-10-CM

## 2023-08-22 ENCOUNTER — Other Ambulatory Visit

## 2023-08-22 ENCOUNTER — Ambulatory Visit
Admission: RE | Admit: 2023-08-22 | Discharge: 2023-08-22 | Disposition: A | Discharge status: Home / Self Care | Source: Ambulatory Visit | Attending: Nurse Practitioner | Admitting: Nurse Practitioner

## 2023-08-22 DIAGNOSIS — R928 Other abnormal and inconclusive findings on diagnostic imaging of breast: Secondary | ICD-10-CM

## 2023-09-22 ENCOUNTER — Other Ambulatory Visit: Payer: Self-pay | Admitting: Nurse Practitioner

## 2023-10-15 ENCOUNTER — Encounter: Payer: Self-pay | Admitting: Nurse Practitioner

## 2023-10-15 ENCOUNTER — Ambulatory Visit: Admitting: Nurse Practitioner

## 2023-10-15 VITALS — BP 122/85 | HR 61 | Temp 97.8°F | Ht 60.0 in | Wt 146.0 lb

## 2023-10-15 DIAGNOSIS — I10 Essential (primary) hypertension: Secondary | ICD-10-CM | POA: Diagnosis not present

## 2023-10-15 DIAGNOSIS — F411 Generalized anxiety disorder: Secondary | ICD-10-CM

## 2023-10-15 DIAGNOSIS — Z6826 Body mass index (BMI) 26.0-26.9, adult: Secondary | ICD-10-CM

## 2023-10-15 DIAGNOSIS — Z23 Encounter for immunization: Secondary | ICD-10-CM | POA: Diagnosis not present

## 2023-10-15 DIAGNOSIS — F3342 Major depressive disorder, recurrent, in full remission: Secondary | ICD-10-CM

## 2023-10-15 MED ORDER — LISINOPRIL-HYDROCHLOROTHIAZIDE 20-12.5 MG PO TABS
1.0000 | ORAL_TABLET | Freq: Every day | ORAL | 1 refills | Status: AC
Start: 2023-10-15 — End: ?

## 2023-10-15 MED ORDER — ESCITALOPRAM OXALATE 20 MG PO TABS
20.0000 mg | ORAL_TABLET | Freq: Every day | ORAL | 5 refills | Status: AC
Start: 2023-10-15 — End: ?

## 2023-10-15 MED ORDER — CLONAZEPAM 0.5 MG PO TABS
0.5000 mg | ORAL_TABLET | Freq: Two times a day (BID) | ORAL | 5 refills | Status: AC | PRN
Start: 2023-10-15 — End: ?

## 2023-10-15 NOTE — Addendum Note (Signed)
 Addended by: VIKTORIA ALAN MATSU on: 10/15/2023 04:42 PM   Modules accepted: Orders

## 2023-10-15 NOTE — Progress Notes (Signed)
 Subjective:    Patient ID: Leslie Ritter, female    DOB: [birth year], 53 y.o.   MRN: [id number]   Chief Complaint: medical management of chronic issues     HPI:  Leslie Ritter is a 53 y.o. who identifies as a female who was assigned female at birth.   Social history: Lives with: with husband Work history: works at [employer] in today for follow up of the following chronic medical issues:  1. Primary hypertension No c/o chest pain, sob or headache. Does not check blood pressure at home. BP Readings from Last 3 Encounters:  06/18/23 124/82  04/16/23 (!) 130/93  12/16/22 124/84     2. GAD (generalized anxiety disorder) Has family issues going on that keep her stressed. Takes klonopin  BID    10/15/2023    4:06 PM 04/16/2023    3:54 PM 10/16/2022    4:08 PM 10/10/2021    4:17 PM  GAD 7 : Generalized Anxiety Score  Nervous, Anxious, on Edge 2 1 2 2   Control/stop worrying 2 2 2 2   Worry too much - different things 2 2 1 3   Trouble relaxing 1 1 1 2   Restless 1 2 1 1   Easily annoyed or irritable 1 1 2 2   Afraid - awful might happen 1 1 1 2   Total GAD 7 Score 10 10 10 14   Anxiety Difficulty Not difficult at all Not difficult at all Not difficult at all Somewhat difficult       3. Recurrent major depressive disorder, in full remission (HCC) Is on lexapro  and has not been doing as well as she was.    10/15/2023    4:06 PM 06/18/2023    3:50 PM 04/16/2023    3:54 PM  Depression screen PHQ 2/9  Decreased Interest 1 0 1  Down, Depressed, Hopeless 1 0 1  PHQ - 2 Score 2 0 2  Altered sleeping 2  2  Tired, decreased energy 2  2  Change in appetite 0  1  Feeling bad or failure about yourself  1  1  Trouble concentrating 2  1  Moving slowly or fidgety/restless 1  1  Suicidal thoughts 0  0  PHQ-9 Score 10  10  Difficult doing work/chores Not difficult at all  Not difficult at all       4. BMI 26.0-26.9,adult No recent weight changes Wt Readings  from Last 3 Encounters:  06/18/23 148 lb (67.1 kg)  04/16/23 147 lb (66.7 kg)  10/16/22 140 lb (63.5 kg)   BMI Readings from Last 3 Encounters:  06/18/23 28.90 kg/m  04/16/23 27.78 kg/m  10/16/22 26.45 kg/m     New complaints: None today  No Known Allergies Outpatient Encounter Medications as of 10/15/2023  Medication Sig   Biotin w/ Vitamins C & E (HAIR/SKIN/NAILS PO) Take by mouth.   clonazePAM  (KLONOPIN ) 0.5 MG tablet Take 1 tablet (0.5 mg total) by mouth 2 (two) times daily as needed for anxiety.   EPINEPHRINE  0.3 mg/0.3 mL IJ SOAJ injection INJECT 0.3 MG INTO THE MUSCLE AS NEEDED FOR ANAPHYLAXIS.   escitalopram  (LEXAPRO ) 10 MG tablet Take 1 tablet (10 mg total) by mouth daily. Take daily with 20 mg tablet for total of 30 mg per day   escitalopram  (LEXAPRO ) 20 MG tablet Take 1 tablet (20 mg total) by mouth daily. Take daily with 10 mg tablet for total of 30 mg per day   lisinopril -hydrochlorothiazide  (ZESTORETIC ) 20-12.5 MG  tablet Take 1 tablet by mouth daily.   Multiple Vitamins-Minerals (MULTIVITAL PO) Take by mouth.   valACYclovir  (VALTREX ) 500 MG tablet Take twice daily for 3 to 5 days as needed   No facility-administered encounter medications on file as of 10/15/2023.    Past Surgical History:  Procedure Laterality Date   Arm surg-Radial nerve     GYNECOLOGIC CRYOSURGERY  1992   LAPAROSCOPIC TUBAL LIGATION Bilateral 03/30/2012   Procedure: LAPAROSCOPIC TUBAL LIGATION;  Surgeon: Evalene SHAUNNA Organ, MD;  Location: [facility];  Service: Gynecology;  Laterality: Bilateral;   mole removed     face - left cheek   TENDON RECONSTRUCTION Right    WISDOM TOOTH EXTRACTION      Family History  Problem Relation Age of Onset   Hypertension Mother    Hypertension Sister    Diabetes Maternal Grandmother    Hypertension Maternal Grandfather    Heart disease Maternal Grandfather    Colon cancer Neg Hx    Colon polyps Neg Hx    Esophageal cancer Neg Hx    Stomach cancer Neg Hx     Rectal cancer Neg Hx       Controlled substance contract: n/a     Review of Systems  Constitutional:  Negative for diaphoresis.  Eyes:  Negative for pain.  Respiratory:  Negative for shortness of breath.   Cardiovascular:  Negative for chest pain, palpitations and leg swelling.  Gastrointestinal:  Negative for abdominal pain.  Endocrine: Negative for polydipsia.  Skin:  Negative for rash.  Neurological:  Negative for dizziness, weakness and headaches.  Hematological:  Does not bruise/bleed easily.  All other systems reviewed and are negative.      Objective:   Physical Exam Vitals and nursing note reviewed.  Constitutional:      General: She is not in acute distress.    Appearance: Normal appearance. She is well-developed.  HENT:     Head: Normocephalic.     Right Ear: Tympanic membrane normal.     Left Ear: Tympanic membrane normal.     Nose: Nose normal.     Mouth/Throat:     Mouth: Mucous membranes are moist.  Eyes:     Pupils: Pupils are equal, round, and reactive to light.  Neck:     Vascular: No carotid bruit or JVD.  Cardiovascular:     Rate and Rhythm: Normal rate and regular rhythm.     Heart sounds: Normal heart sounds.  Pulmonary:     Effort: Pulmonary effort is normal. No respiratory distress.     Breath sounds: Normal breath sounds. No wheezing or rales.  Chest:     Chest wall: No tenderness.  Abdominal:     General: Bowel sounds are normal. There is no distension or abdominal bruit.     Palpations: Abdomen is soft. There is no hepatomegaly, splenomegaly, mass or pulsatile mass.     Tenderness: There is no abdominal tenderness.  Musculoskeletal:        General: Normal range of motion.     Cervical back: Normal range of motion and neck supple.  Lymphadenopathy:     Cervical: No cervical adenopathy.  Skin:    General: Skin is warm and dry.  Neurological:     Mental Status: She is alert and oriented to person, place, and time.     Deep Tendon  Reflexes: Reflexes are normal and symmetric.  Psychiatric:        Behavior: Behavior normal.  Thought Content: Thought content normal.        Judgment: Judgment normal.    LMP 03/22/2021 (Approximate)   BP 122/85   Pulse 61   Temp 97.8 F (36.6 C) (Temporal)   Ht 5' (1.524 m)   Wt 146 lb (66.2 kg)   LMP 03/22/2021 (Approximate)   SpO2 96%   BMI 28.51 kg/m        Assessment & Plan:  LALANA WACHTER comes in today with chief complaint of Medical Management of Chronic Issues   Diagnosis and orders addressed:  1. Primary hypertension (Primary) Dsh diet - lisinopril -hydrochlorothiazide  (ZESTORETIC ) 20-12.5 MG tablet; Take 1 tablet by mouth daily.  Dispense: 90 tablet; Refill: 1 - CBC with Differential/Platelet - CMP14+EGFR - Lipid panel  2. Recurrent major depressive disorder, in full remission Stress management Increase lexapro  to 20mg  daily - escitalopram  (LEXAPRO ) 20 MG tablet; Take 1 tablet (20 mg total) by mouth daily.  Dispense: 30 tablet; Refill: 5  3. GAD (generalized anxiety disorder) Stress management - clonazePAM  (KLONOPIN ) 0.5 MG tablet; Take 1 tablet (0.5 mg total) by mouth 2 (two) times daily as needed for anxiety.  Dispense: 60 tablet; Refill: 5  4. BMI 26.0-26.9,adult Discussed diet and exercise for person with BMI >25 Will recheck weight in 3-6 months    Labs pending Health Maintenance reviewed Diet and exercise encouraged  Follow up plan: 6 months   Leslie Gladis, FNP

## 2023-10-15 NOTE — Addendum Note (Signed)
 Addended by: GLADIS MUSTARD on: 10/15/2023 04:33 PM   Modules accepted: Level of Service

## 2023-10-16 ENCOUNTER — Telehealth: Payer: Self-pay | Admitting: Family Medicine

## 2023-10-16 NOTE — Telephone Encounter (Signed)
 Copied from CRM [id number]. Topic: Clinical - Prescription Issue >> Oct 16, 2023  3:27 PM Emylou G wrote: Reason for CRM: Patient called.. wondering if medication is correct for the escitalopram  (LEXAPRO ) 10 MG tablet escitalopram  (LEXAPRO ) 20 MG tablet .SABRA She thought from the appt yesterday.. it was suppose increased?  Pls review/call patient

## 2023-10-18 ENCOUNTER — Telehealth: Admitting: Family

## 2023-10-18 DIAGNOSIS — U071 COVID-19: Secondary | ICD-10-CM

## 2023-10-18 MED ORDER — PROMETHAZINE-DM 6.25-15 MG/5ML PO SYRP: 5.0000 mL | ORAL_SOLUTION | Freq: Three times a day (TID) | ORAL | 0 refills | Status: DC | PRN

## 2023-10-18 MED ORDER — NIRMATRELVIR/RITONAVIR (PAXLOVID)TABLET
3.0000 | ORAL_TABLET | Freq: Two times a day (BID) | ORAL | 0 refills | Status: AC
Start: 2023-10-18 — End: 2023-10-23

## 2023-10-18 MED ORDER — BENZONATATE 100 MG PO CAPS: 100.0000 mg | ORAL_CAPSULE | Freq: Three times a day (TID) | ORAL | 0 refills | Status: DC | PRN

## 2023-10-18 NOTE — Progress Notes (Signed)
 Virtual Visit Consent   Leslie Ritter, you are scheduled for a virtual visit with a [facility] provider today. Just as with appointments in the office, your consent must be obtained to participate. Your consent will be active for this visit and any virtual visit you may have with one of our providers in the next 365 days. If you have a MyChart account, a copy of this consent can be sent to you electronically.  As this is a virtual visit, video technology does not allow for your provider to perform a traditional examination. This may limit your provider's ability to fully assess your condition. If your provider identifies any concerns that need to be evaluated in person or the need to arrange testing (such as labs, EKG, etc.), we will make arrangements to do so. Although advances in technology are sophisticated, we cannot ensure that it will always work on either your end or our end. If the connection with a video visit is poor, the visit may have to be switched to a telephone visit. With either a video or telephone visit, we are not always able to ensure that we have a secure connection.  By engaging in this virtual visit, you consent to the provision of healthcare and authorize for your insurance to be billed (if applicable) for the services provided during this visit. Depending on your insurance coverage, you may receive a charge related to this service.  I need to obtain your verbal consent now. Are you willing to proceed with your visit today? Leslie Ritter has provided verbal consent on 10/18/2023 for a virtual visit (video or telephone). Leslie Learn, FNP  Date: 10/18/2023 12:50 PM   Virtual Visit via Video Note   I, Leslie Ritter, connected with  Leslie Ritter  ([id number], February 26, 1970) on 10/18/23 at 12:45 PM EDT by a video-enabled telemedicine application and verified that I am speaking with the correct person using two identifiers.  Location: Patient: Virtual Visit Location Patient:  Home Provider: Virtual Visit Location Provider: Home Office   I discussed the limitations of evaluation and management by telemedicine and the availability of in person appointments. The patient expressed understanding and agreed to proceed.    History of Present Illness: Leslie Ritter is a 53 y.o. who identifies as a female who was assigned female at birth, and is being seen today for COVID. She reports her symptoms started yesterday and tested positive.   HPI: URI  This is a new problem. The current episode started yesterday. The problem has been unchanged. There has been no fever. Associated symptoms include congestion, coughing, headaches, joint pain, rhinorrhea, sinus pain and a sore throat. Pertinent negatives include no ear pain or sneezing. She has tried acetaminophen  and increased fluids for the symptoms.    Problems:  Patient Active Problem List   Diagnosis Date Noted   BMI 26.0-26.9,adult 06/04/2018   Benzodiazepine dependence, continuous (HCC) 11/30/2017   Controlled substance agreement signed 11/30/2017   Depression 11/02/2014   GAD (generalized anxiety disorder) 11/02/2014   Hypertension 12/03/2012   Abnormal Pap smear of cervix    LGSIL (low grade squamous intraepithelial dysplasia)    HSV-1 infection    Renal lithiasis     Allergies: No Known Allergies Medications:  Current Outpatient Medications:    benzonatate (TESSALON PERLES) 100 MG capsule, Take 1 capsule (100 mg total) by mouth 3 (three) times daily as needed., Disp: 20 capsule, Rfl: 0   nirmatrelvir/ritonavir (PAXLOVID) 20 x 150 MG & 10 x  100MG  TABS, Take 3 tablets by mouth 2 (two) times daily for 5 days. (Take nirmatrelvir 150 mg two tablets twice daily for 5 days and ritonavir 100 mg one tablet twice daily for 5 days) Patient GFR is 70, Disp: 30 tablet, Rfl: 0   promethazine -dextromethorphan (PROMETHAZINE -DM) 6.25-15 MG/5ML syrup, Take 5 mLs by mouth 3 (three) times daily as needed for cough., Disp: 118 mL, Rfl:  0   Biotin w/ Vitamins C & E (HAIR/SKIN/NAILS PO), Take by mouth., Disp: , Rfl:    clonazePAM  (KLONOPIN ) 0.5 MG tablet, Take 1 tablet (0.5 mg total) by mouth 2 (two) times daily as needed for anxiety., Disp: 60 tablet, Rfl: 5   EPINEPHRINE  0.3 mg/0.3 mL IJ SOAJ injection, INJECT 0.3 MG INTO THE MUSCLE AS NEEDED FOR ANAPHYLAXIS., Disp: 2 each, Rfl: 0   escitalopram  (LEXAPRO ) 10 MG tablet, Take 1 tablet (10 mg total) by mouth daily. Take daily with 20 mg tablet for total of 30 mg per day, Disp: 90 tablet, Rfl: 1   escitalopram  (LEXAPRO ) 20 MG tablet, Take 1 tablet (20 mg total) by mouth daily., Disp: 30 tablet, Rfl: 5   lisinopril -hydrochlorothiazide  (ZESTORETIC ) 20-12.5 MG tablet, Take 1 tablet by mouth daily., Disp: 90 tablet, Rfl: 1   Multiple Vitamins-Minerals (MULTIVITAL PO), Take by mouth., Disp: , Rfl:    valACYclovir  (VALTREX ) 500 MG tablet, Take twice daily for 3 to 5 days as needed, Disp: 30 tablet, Rfl: 12   VITAMIN D  PO, Take by mouth., Disp: , Rfl:   Observations/Objective: Patient is well-developed, well-nourished in no acute distress.  Resting comfortably  at home.  Head is normocephalic, atraumatic.  No labored breathing.  Speech is clear and coherent with logical content.  Patient is alert and oriented at baseline.  Nasal congestion  Assessment and Plan: 1. COVID-19 (Primary) - nirmatrelvir/ritonavir (PAXLOVID) 20 x 150 MG & 10 x 100MG  TABS; Take 3 tablets by mouth 2 (two) times daily for 5 days. (Take nirmatrelvir 150 mg two tablets twice daily for 5 days and ritonavir 100 mg one tablet twice daily for 5 days) Patient GFR is 70  Dispense: 30 tablet; Refill: 0 - benzonatate (TESSALON PERLES) 100 MG capsule; Take 1 capsule (100 mg total) by mouth 3 (three) times daily as needed.  Dispense: 20 capsule; Refill: 0 - promethazine -dextromethorphan (PROMETHAZINE -DM) 6.25-15 MG/5ML syrup; Take 5 mLs by mouth 3 (three) times daily as needed for cough.  Dispense: 118 mL; Refill:  0  COVID positive, rest, force fluids, tylenol  as needed,  report any worsening symptoms such as increased shortness of breath, swelling, or continued high fevers. Possible adverse effects discussed with antivirals. Will give Paxlovid, but will not start medication unless her symptoms worsen    Follow Up Instructions: I discussed the assessment and treatment plan with the patient. The patient was provided an opportunity to ask questions and all were answered. The patient agreed with the plan and demonstrated an understanding of the instructions.  A copy of instructions were sent to the patient via MyChart unless otherwise noted below.     The patient was advised to call back or seek an in-person evaluation if the symptoms worsen or if the condition fails to improve as anticipated.    Leslie Learn, FNP

## 2023-10-19 ENCOUNTER — Ambulatory Visit: Admitting: Nurse Practitioner

## 2023-10-20 ENCOUNTER — Other Ambulatory Visit: Payer: Self-pay | Admitting: Nurse Practitioner

## 2023-10-20 MED ORDER — BUPROPION HCL ER (XL) 150 MG PO TB24: 150.0000 mg | ORAL_TABLET | ORAL | 2 refills | Status: DC

## 2023-10-20 NOTE — Progress Notes (Signed)
 Add wellbutrin  150XL daily Meds ordered this encounter  Medications   buPROPion  (WELLBUTRIN  XL) 150 MG 24 hr tablet    Sig: Take 1 tablet (150 mg total) by mouth every morning.    Dispense:  30 tablet    Refill:  2    Supervising Provider:   MARYANNE CHEW A [[id number]]   Mary-Margaret Gladis, FNP

## 2023-11-17 ENCOUNTER — Other Ambulatory Visit: Payer: Self-pay | Admitting: Medical Genetics

## 2023-12-02 ENCOUNTER — Other Ambulatory Visit (HOSPITAL_COMMUNITY)
Admission: RE | Admit: 2023-12-02 | Discharge: 2023-12-02 | Disposition: A | Discharge status: Home / Self Care | Payer: Self-pay | Source: Ambulatory Visit | Attending: Medical Genetics | Admitting: Medical Genetics

## 2023-12-14 LAB — GENECONNECT MOLECULAR SCREEN: Genetic Analysis Overall Interpretation: NEGATIVE

## 2023-12-28 ENCOUNTER — Telehealth: Admitting: Physician Assistant

## 2023-12-28 DIAGNOSIS — B9689 Other specified bacterial agents as the cause of diseases classified elsewhere: Secondary | ICD-10-CM | POA: Diagnosis not present

## 2023-12-28 DIAGNOSIS — J019 Acute sinusitis, unspecified: Secondary | ICD-10-CM | POA: Diagnosis not present

## 2023-12-28 MED ORDER — IPRATROPIUM BROMIDE 0.03 % NA SOLN: 2.0000 | Freq: Two times a day (BID) | NASAL | 0 refills | Status: AC

## 2023-12-28 MED ORDER — AMOXICILLIN-POT CLAVULANATE 875-125 MG PO TABS: 1.0000 | ORAL_TABLET | Freq: Two times a day (BID) | ORAL | 0 refills | Status: AC

## 2023-12-28 MED ORDER — PROMETHAZINE-DM 6.25-15 MG/5ML PO SYRP: 5.0000 mL | ORAL_SOLUTION | Freq: Four times a day (QID) | ORAL | 0 refills | Status: AC | PRN

## 2023-12-28 NOTE — Progress Notes (Signed)
 Virtual Visit Consent   Leslie Ritter, you are scheduled for a virtual visit with a [facility] provider today. Just as with appointments in the office, your consent must be obtained to participate. Your consent will be active for this visit and any virtual visit you may have with one of our providers in the next 365 days. If you have a MyChart account, a copy of this consent can be sent to you electronically.  As this is a virtual visit, video technology does not allow for your provider to perform a traditional examination. This may limit your provider's ability to fully assess your condition. If your provider identifies any concerns that need to be evaluated in person or the need to arrange testing (such as labs, EKG, etc.), we will make arrangements to do so. Although advances in technology are sophisticated, we cannot ensure that it will always work on either your end or our end. If the connection with a video visit is poor, the visit may have to be switched to a telephone visit. With either a video or telephone visit, we are not always able to ensure that we have a secure connection.  By engaging in this virtual visit, you consent to the provision of healthcare and authorize for your insurance to be billed (if applicable) for the services provided during this visit. Depending on your insurance coverage, you may receive a charge related to this service.  I need to obtain your verbal consent now. Are you willing to proceed with your visit today? Leslie Ritter has provided verbal consent on 12/28/2023 for a virtual visit (video or telephone). Leslie Ritter, [place]  Date: 12/28/2023 3:00 PM   Virtual Visit via Video Note   I, Leslie Ritter, connected with  KIAN GAMARRA  ([id number], [birth year]) on 12/28/23 at  3:00 PM EST by a video-enabled telemedicine application and verified that I am speaking with the correct person using two identifiers.  Location: Patient: Virtual Visit Location  Patient: Home Provider: Virtual Visit Location Provider: Home Office   I discussed the limitations of evaluation and management by telemedicine and the availability of in person appointments. The patient expressed understanding and agreed to proceed.    History of Present Illness: LAN ENTSMINGER is a 53 y.o. who identifies as a female who was assigned female at birth, and is being seen today for > 1 week of URI symptoms including headache, nasal/sinus congestion, cough. Notes since last week, headache has resolved but still with persistent sinus congestion and cough, now with L ear pressure/discomfort.   OTC -- Mucinex, Nyquil.  HPI: HPI  Problems:  Patient Active Problem List   Diagnosis Date Noted   BMI 26.0-26.9,adult 06/04/2018   Benzodiazepine dependence, continuous (HCC) 11/30/2017   Controlled substance agreement signed 11/30/2017   Depression 11/02/2014   GAD (generalized anxiety disorder) 11/02/2014   Hypertension 12/03/2012   Abnormal Pap smear of cervix    LGSIL (low grade squamous intraepithelial dysplasia)    HSV-1 infection    Renal lithiasis     Allergies: No Known Allergies Medications:  Current Outpatient Medications:    amoxicillin -clavulanate (AUGMENTIN ) 875-125 MG tablet, Take 1 tablet by mouth 2 (two) times daily., Disp: 14 tablet, Rfl: 0   ipratropium (ATROVENT ) 0.03 % nasal spray, Place 2 sprays into both nostrils every 12 (twelve) hours., Disp: 30 mL, Rfl: 0   promethazine -dextromethorphan (PROMETHAZINE -DM) 6.25-15 MG/5ML syrup, Take 5 mLs by mouth 4 (four) times daily as needed for cough., Disp:  118 mL, Rfl: 0   Biotin w/ Vitamins C & E (HAIR/SKIN/NAILS PO), Take by mouth., Disp: , Rfl:    buPROPion  (WELLBUTRIN  XL) 150 MG 24 hr tablet, Take 1 tablet (150 mg total) by mouth every morning., Disp: 30 tablet, Rfl: 2   clonazePAM  (KLONOPIN ) 0.5 MG tablet, Take 1 tablet (0.5 mg total) by mouth 2 (two) times daily as needed for anxiety., Disp: 60 tablet, Rfl: 5    EPINEPHRINE  0.3 mg/0.3 mL IJ SOAJ injection, INJECT 0.3 MG INTO THE MUSCLE AS NEEDED FOR ANAPHYLAXIS., Disp: 2 each, Rfl: 0   escitalopram  (LEXAPRO ) 20 MG tablet, Take 1 tablet (20 mg total) by mouth daily., Disp: 30 tablet, Rfl: 5   lisinopril -hydrochlorothiazide  (ZESTORETIC ) 20-12.5 MG tablet, Take 1 tablet by mouth daily., Disp: 90 tablet, Rfl: 1   Multiple Vitamins-Minerals (MULTIVITAL PO), Take by mouth., Disp: , Rfl:    valACYclovir  (VALTREX ) 500 MG tablet, Take twice daily for 3 to 5 days as needed, Disp: 30 tablet, Rfl: 12   VITAMIN D  PO, Take by mouth., Disp: , Rfl:   Observations/Objective: Patient is well-developed, well-nourished in no acute distress.  Resting comfortably  at home.  Head is normocephalic, atraumatic.  No labored breathing.  Speech is clear and coherent with logical content.  Patient is alert and oriented at baseline.   Assessment and Plan: 1. Acute bacterial sinusitis (Primary) - ipratropium (ATROVENT ) 0.03 % nasal spray; Place 2 sprays into both nostrils every 12 (twelve) hours.  Dispense: 30 mL; Refill: 0 - promethazine -dextromethorphan (PROMETHAZINE -DM) 6.25-15 MG/5ML syrup; Take 5 mLs by mouth 4 (four) times daily as needed for cough.  Dispense: 118 mL; Refill: 0 - amoxicillin -clavulanate (AUGMENTIN ) 875-125 MG tablet; Take 1 tablet by mouth 2 (two) times daily.  Dispense: 14 tablet; Refill: 0  Rx Augmentin .  Increase fluids.  Rest.  Saline nasal spray.  Probiotic.  Mucinex as directed.  Humidifier in bedroom. Atrovent  nasal spray and cough syrup per orders.  Call or return to clinic if symptoms are not improving.   Follow Up Instructions: I discussed the assessment and treatment plan with the patient. The patient was provided an opportunity to ask questions and all were answered. The patient agreed with the plan and demonstrated an understanding of the instructions.  A copy of instructions were sent to the patient via MyChart unless otherwise noted below.    The patient was advised to call back or seek an in-person evaluation if the symptoms worsen or if the condition fails to improve as anticipated.    Leslie Velma Lunger, PA-C

## 2023-12-28 NOTE — Patient Instructions (Signed)
 Leslie Ritter, thank you for joining Leslie Velma Lunger, PA-C for today's virtual visit.  While this provider is not your primary care provider (PCP), if your PCP is located in our provider database this encounter information will be shared with them immediately following your visit.   A [facility] MyChart account gives you access to today's visit and all your visits, tests, and labs performed at [facility]  click here if you don't have a [facility] MyChart account or go to [email or web address]  Consent: (Patient) Leslie Ritter provided verbal consent for this virtual visit at the beginning of the encounter.  Current Medications:  Current Outpatient Medications:    benzonatate  (TESSALON  PERLES) 100 MG capsule, Take 1 capsule (100 mg total) by mouth 3 (three) times daily as needed., Disp: 20 capsule, Rfl: 0   Biotin w/ Vitamins C & E (HAIR/SKIN/NAILS PO), Take by mouth., Disp: , Rfl:    buPROPion  (WELLBUTRIN  XL) 150 MG 24 hr tablet, Take 1 tablet (150 mg total) by mouth every morning., Disp: 30 tablet, Rfl: 2   clonazePAM  (KLONOPIN ) 0.5 MG tablet, Take 1 tablet (0.5 mg total) by mouth 2 (two) times daily as needed for anxiety., Disp: 60 tablet, Rfl: 5   EPINEPHRINE  0.3 mg/0.3 mL IJ SOAJ injection, INJECT 0.3 MG INTO THE MUSCLE AS NEEDED FOR ANAPHYLAXIS., Disp: 2 each, Rfl: 0   escitalopram  (LEXAPRO ) 10 MG tablet, Take 1 tablet (10 mg total) by mouth daily. Take daily with 20 mg tablet for total of 30 mg per day, Disp: 90 tablet, Rfl: 1   escitalopram  (LEXAPRO ) 20 MG tablet, Take 1 tablet (20 mg total) by mouth daily., Disp: 30 tablet, Rfl: 5   lisinopril -hydrochlorothiazide  (ZESTORETIC ) 20-12.5 MG tablet, Take 1 tablet by mouth daily., Disp: 90 tablet, Rfl: 1   Multiple Vitamins-Minerals (MULTIVITAL PO), Take by mouth., Disp: , Rfl:    promethazine -dextromethorphan (PROMETHAZINE -DM) 6.25-15 MG/5ML syrup, Take 5 mLs by mouth 3 (three) times daily as needed for cough., Disp:  118 mL, Rfl: 0   valACYclovir  (VALTREX ) 500 MG tablet, Take twice daily for 3 to 5 days as needed, Disp: 30 tablet, Rfl: 12   VITAMIN D  PO, Take by mouth., Disp: , Rfl:    Medications ordered in this encounter:  No orders of the defined types were placed in this encounter.    *If you need refills on other medications prior to your next appointment, please contact your pharmacy*  Follow-Up: Call back or seek an in-person evaluation if the symptoms worsen or if the condition fails to improve as anticipated.  [facility] [phone]  Other Instructions Please take antibiotic as directed.  Increase fluid intake.  Use Saline nasal spray.  Take a daily multivitamin. Use the prescription nasal spray as directed.  Take the cough medicine as prescribed. Ok to continue your OTC medications.Place a humidifier in the bedroom.  If you note any non-resolving, new, or worsening symptoms despite treatment, please seek an in-person evaluation ASAP.   Sinusitis Sinusitis is redness, soreness, and swelling (inflammation) of the paranasal sinuses. Paranasal sinuses are air pockets within the bones of your face (beneath the eyes, the middle of the forehead, or above the eyes). In healthy paranasal sinuses, mucus is able to drain out, and air is able to circulate through them by way of your nose. However, when your paranasal sinuses are inflamed, mucus and air can become trapped. This can allow bacteria and other germs to grow and cause infection. Sinusitis can  develop quickly and last only a short time (acute) or continue over a long period (chronic). Sinusitis that lasts for more than 12 weeks is considered chronic.  CAUSES  Causes of sinusitis include: Allergies. Structural abnormalities, such as displacement of the cartilage that separates your nostrils (deviated septum), which can decrease the air flow through your nose and sinuses and affect sinus drainage. Functional abnormalities, such as  when the small hairs (cilia) that line your sinuses and help remove mucus do not work properly or are not present. SYMPTOMS  Symptoms of acute and chronic sinusitis are the same. The primary symptoms are pain and pressure around the affected sinuses. Other symptoms include: Upper toothache. Earache. Headache. Bad breath. Decreased sense of smell and taste. A cough, which worsens when you are lying flat. Fatigue. Fever. Thick drainage from your nose, which often is green and may contain pus (purulent). Swelling and warmth over the affected sinuses. DIAGNOSIS  Your caregiver will perform a physical exam. During the exam, your caregiver may: Look in your nose for signs of abnormal growths in your nostrils (nasal polyps). Tap over the affected sinus to check for signs of infection. View the inside of your sinuses (endoscopy) with a special imaging device with a light attached (endoscope), which is inserted into your sinuses. If your caregiver suspects that you have chronic sinusitis, one or more of the following tests may be recommended: Allergy tests. Nasal culture A sample of mucus is taken from your nose and sent to a lab and screened for bacteria. Nasal cytology A sample of mucus is taken from your nose and examined by your caregiver to determine if your sinusitis is related to an allergy. TREATMENT  Most cases of acute sinusitis are related to a viral infection and will resolve on their own within 10 days. Sometimes medicines are prescribed to help relieve symptoms (pain medicine, decongestants, nasal steroid sprays, or saline sprays).  However, for sinusitis related to a bacterial infection, your caregiver will prescribe antibiotic medicines. These are medicines that will help kill the bacteria causing the infection.  Rarely, sinusitis is caused by a fungal infection. In theses cases, your caregiver will prescribe antifungal medicine. For some cases of chronic sinusitis, surgery is  needed. Generally, these are cases in which sinusitis recurs more than 3 times per year, despite other treatments. HOME CARE INSTRUCTIONS  Drink plenty of water. Water helps thin the mucus so your sinuses can drain more easily. Use a humidifier. Inhale steam 3 to 4 times a day (for example, sit in the bathroom with the shower running). Apply a warm, moist washcloth to your face 3 to 4 times a day, or as directed by your caregiver. Use saline nasal sprays to help moisten and clean your sinuses. Take over-the-counter or prescription medicines for pain, discomfort, or fever only as directed by your caregiver. SEEK IMMEDIATE MEDICAL CARE IF: You have increasing pain or severe headaches. You have nausea, vomiting, or drowsiness. You have swelling around your face. You have vision problems. You have a stiff neck. You have difficulty breathing. MAKE SURE YOU:  Understand these instructions. Will watch your condition. Will get help right away if you are not doing well or get worse. Document Released: 01/06/2005 Document Revised: 03/31/2011 Document Reviewed: 01/21/2011 [facility] Patient Information 2014 [place], [place].    If you have been instructed to have an in-person evaluation today at a local Urgent Care facility, please use the link below. It will take you to a list of all of  our available [facility], including address, phone number and hours of operation. Please do not delay care.  [facility]  If you or a family member do not have a primary care provider, use the link below to schedule a visit and establish care. When you choose a [facility] primary care physician or advanced practice provider, you gain a long-term partner in health. Find a Primary Care Provider  Learn more about [facility]'s in-office and virtual care options: [facility] - Get Care Now

## 2024-01-19 ENCOUNTER — Other Ambulatory Visit: Payer: Self-pay | Admitting: Nurse Practitioner

## 2024-04-12 ENCOUNTER — Ambulatory Visit: Admitting: Nurse Practitioner

## 2024-04-14 ENCOUNTER — Ambulatory Visit: Payer: Self-pay | Admitting: Nurse Practitioner
